# Patient Record
Sex: Male | Born: 1955 | Race: White | Hispanic: No | Marital: Married | Smoking: Never smoker
Health system: Southern US, Community
[De-identification: ages and names within clinical notes are randomized; demographics above are authoritative.]

## PROBLEM LIST (undated history)

## (undated) DIAGNOSIS — K589 Irritable bowel syndrome without diarrhea: Secondary | ICD-10-CM

## (undated) DIAGNOSIS — K4090 Unilateral inguinal hernia, without obstruction or gangrene, not specified as recurrent: Secondary | ICD-10-CM

## (undated) DIAGNOSIS — N400 Enlarged prostate without lower urinary tract symptoms: Secondary | ICD-10-CM

## (undated) DIAGNOSIS — R109 Unspecified abdominal pain: Secondary | ICD-10-CM

## (undated) DIAGNOSIS — Z9889 Other specified postprocedural states: Secondary | ICD-10-CM

## (undated) DIAGNOSIS — F1011 Alcohol abuse, in remission: Secondary | ICD-10-CM

## (undated) DIAGNOSIS — K219 Gastro-esophageal reflux disease without esophagitis: Secondary | ICD-10-CM

## (undated) DIAGNOSIS — I10 Essential (primary) hypertension: Secondary | ICD-10-CM

## (undated) DIAGNOSIS — F5104 Psychophysiologic insomnia: Secondary | ICD-10-CM

## (undated) DIAGNOSIS — N419 Inflammatory disease of prostate, unspecified: Secondary | ICD-10-CM

## (undated) DIAGNOSIS — G8929 Other chronic pain: Secondary | ICD-10-CM

## (undated) HISTORY — DX: Other chronic pain: G89.29

## (undated) HISTORY — PX: OTHER SURGICAL HISTORY: SHX169

## (undated) HISTORY — DX: Unilateral inguinal hernia, without obstruction or gangrene, not specified as recurrent: K40.90

## (undated) HISTORY — DX: Gastro-esophageal reflux disease without esophagitis: K21.9

## (undated) HISTORY — DX: Unspecified abdominal pain: R10.9

## (undated) HISTORY — DX: Other specified postprocedural states: Z98.890

## (undated) HISTORY — DX: Irritable bowel syndrome, unspecified: K58.9

## (undated) HISTORY — PX: BUNIONECTOMY: SHX129

## (undated) HISTORY — PX: REPLACEMENT TOTAL KNEE: SUR1224

## (undated) HISTORY — PX: UPPER GASTROINTESTINAL ENDOSCOPY: SHX188

## (undated) HISTORY — DX: Psychophysiologic insomnia: F51.04

## (undated) HISTORY — PX: WISDOM TOOTH EXTRACTION: SHX21

## (undated) HISTORY — DX: Alcohol abuse, in remission: F10.11

## (undated) HISTORY — DX: Benign prostatic hyperplasia without lower urinary tract symptoms: N40.0

## (undated) HISTORY — PX: COLONOSCOPY: SHX174

## (undated) HISTORY — DX: Inflammatory disease of prostate, unspecified: N41.9

---

## 1976-09-02 HISTORY — PX: ANTERIOR CRUCIATE LIGAMENT REPAIR: SHX115

## 1998-05-24 ENCOUNTER — Encounter: Admission: RE | Admit: 1998-05-24 | Discharge: 1998-08-22 | Payer: Self-pay | Admitting: Family Medicine

## 1999-05-29 ENCOUNTER — Emergency Department (HOSPITAL_COMMUNITY): Admission: EM | Admit: 1999-05-29 | Discharge: 1999-05-29 | Payer: Self-pay | Admitting: Emergency Medicine

## 1999-05-29 ENCOUNTER — Encounter: Payer: Self-pay | Admitting: Emergency Medicine

## 1999-07-13 ENCOUNTER — Ambulatory Visit (HOSPITAL_COMMUNITY): Admission: RE | Admit: 1999-07-13 | Discharge: 1999-07-13 | Payer: Self-pay | Admitting: Gastroenterology

## 2001-02-24 ENCOUNTER — Ambulatory Visit (HOSPITAL_COMMUNITY): Admission: RE | Admit: 2001-02-24 | Discharge: 2001-02-24 | Payer: Self-pay | Admitting: Gastroenterology

## 2002-09-02 DIAGNOSIS — Z9889 Other specified postprocedural states: Secondary | ICD-10-CM

## 2002-09-02 HISTORY — DX: Other specified postprocedural states: Z98.890

## 2004-01-10 ENCOUNTER — Emergency Department (HOSPITAL_COMMUNITY): Admission: EM | Admit: 2004-01-10 | Discharge: 2004-01-10 | Payer: Self-pay | Admitting: Emergency Medicine
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
## 2005-04-06 ENCOUNTER — Emergency Department (HOSPITAL_COMMUNITY): Admission: EM | Admit: 2005-04-06 | Discharge: 2005-04-06 | Payer: Self-pay | Admitting: Emergency Medicine

## 2005-04-09 ENCOUNTER — Emergency Department (HOSPITAL_COMMUNITY): Admission: EM | Admit: 2005-04-09 | Discharge: 2005-04-09 | Payer: Self-pay | Admitting: Emergency Medicine

## 2007-02-21 ENCOUNTER — Emergency Department (HOSPITAL_COMMUNITY): Admission: EM | Admit: 2007-02-21 | Discharge: 2007-02-22 | Payer: Self-pay | Admitting: Emergency Medicine
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
## 2007-07-27 ENCOUNTER — Ambulatory Visit (HOSPITAL_COMMUNITY): Admission: RE | Admit: 2007-07-27 | Discharge: 2007-07-27 | Payer: Self-pay | Admitting: Gastroenterology

## 2007-07-27 IMAGING — NM NM HEPATO W/GB/PHARM/[PERSON_NAME]
2 series · 12 of 12 positions shown · non-contrast
Comparison: None.

CLINICAL DATA: Epigastric pain with nausea and vomiting. 
 NUCLEAR MEDICINE HEPATOBILIARY SCAN WITH EJECTION FRACTION:
TECHNIQUE: Sequential abdominal images were obtained following intravenous injection of radiopharmaceutical.  Sequential images were continued during slow intravenous infusion of sincalide, and the gallbladder ejection fraction was calculated.
 Radiopharmaceutical:  5.2 mCi [GJ] Choletec and 1.5 mcg CCK.

[Series 1: he hepatobiliary · 3.22mm/px · 6 of 56 frames shown (1 of 2)]
[frame 5/56]
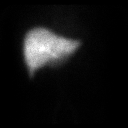
[frame 14/56]
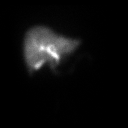
[frame 24/56]
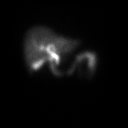
[frame 33/56]
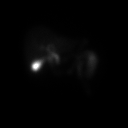
[frame 42/56]
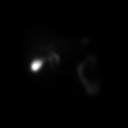
[frame 52/56]
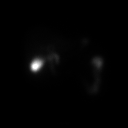

[Series 1: he hepatobiliary · 3.22mm/px · 6 of 60 frames shown (2 of 2)]
[frame 6/60]
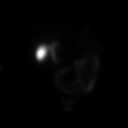
[frame 16/60]
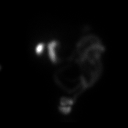
[frame 26/60]
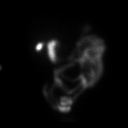
[frame 36/60]
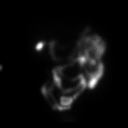
[frame 46/60]
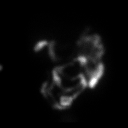
[frame 56/60]
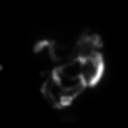

[12 of 12 positions shown; findings below may reference images not displayed]

FINDINGS: There is prompt visualization of the gallbladder, bile ducts, and small bowel.  The gallbladder ejection fraction is calculated at 89.4%, which is normal.
IMPRESSION: 1.  Patent cystic and common bile ducts. 
 2.  Gallbladder contracts physiologically.

## 2007-10-22 ENCOUNTER — Emergency Department (HOSPITAL_COMMUNITY): Admission: EM | Admit: 2007-10-22 | Discharge: 2007-10-22 | Payer: Self-pay | Admitting: Emergency Medicine

## 2007-10-22 IMAGING — CR DG ABDOMEN ACUTE W/ 1V CHEST
3 series · 3 of 3 positions shown · non-contrast
Comparison: none

CLINICAL DATA: Chest and abdominal pain

Acute abdomen with chest:
Frontal chest film is clear. Supine and erect abdomen films show no free air.
Normal bowel gas pattern. Bilateral pelvic phleboliths. Visualized bones
unremarkable.

[w chest pa]
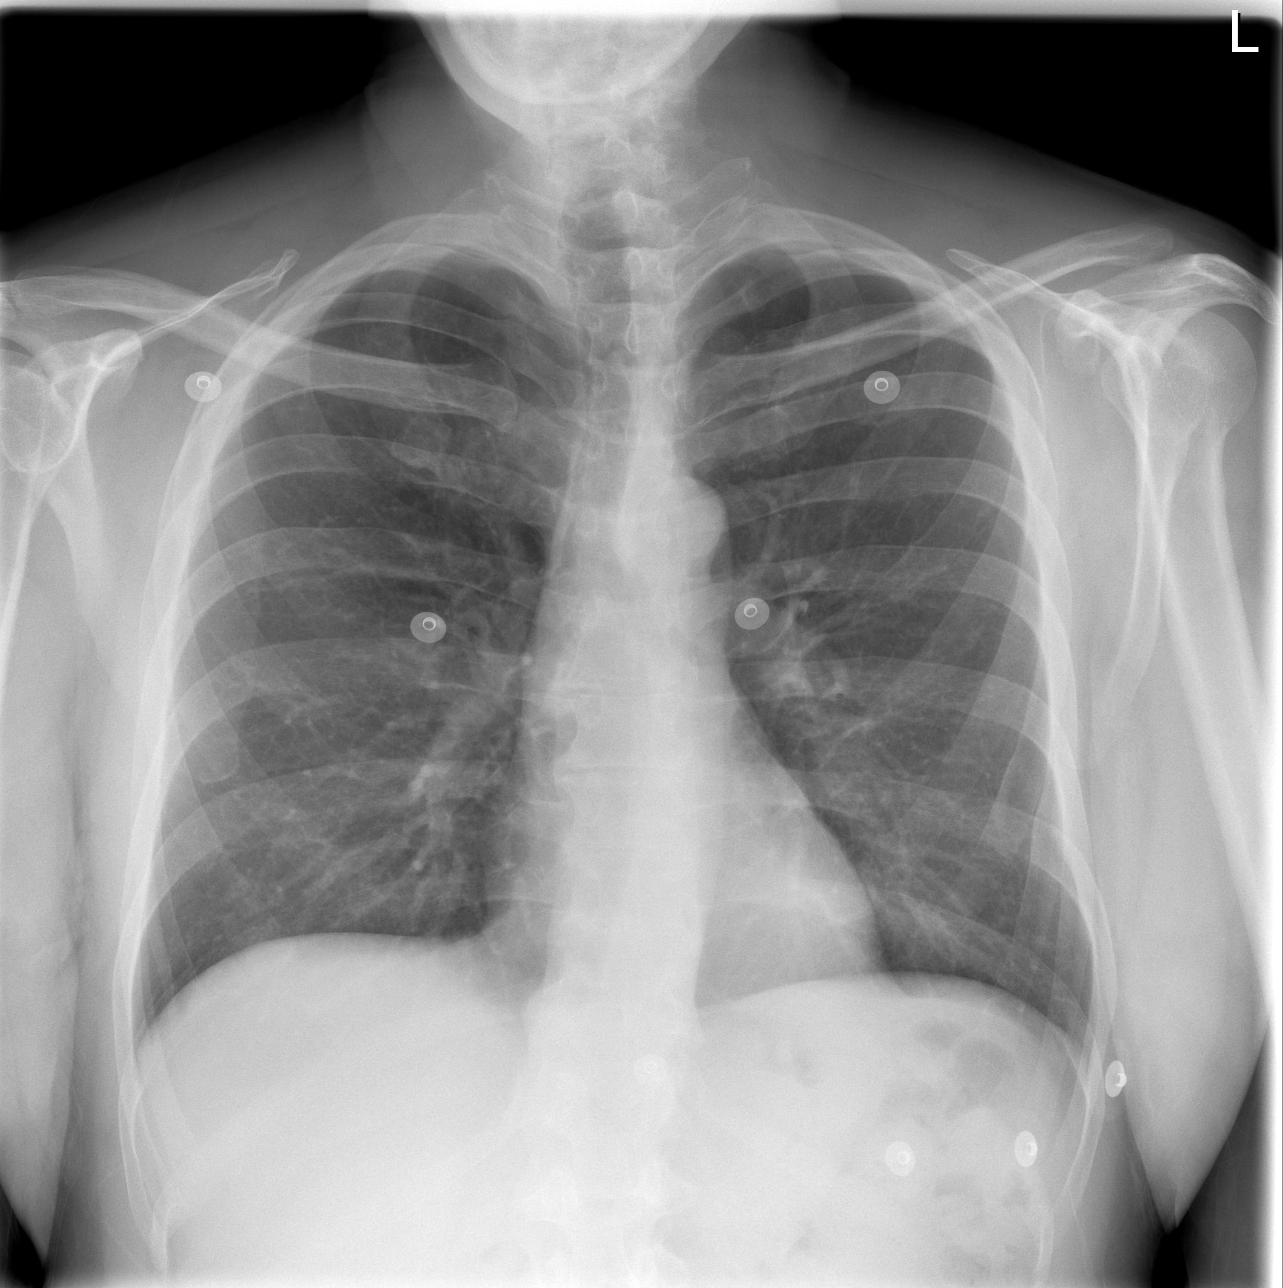

[w abdomen upright]
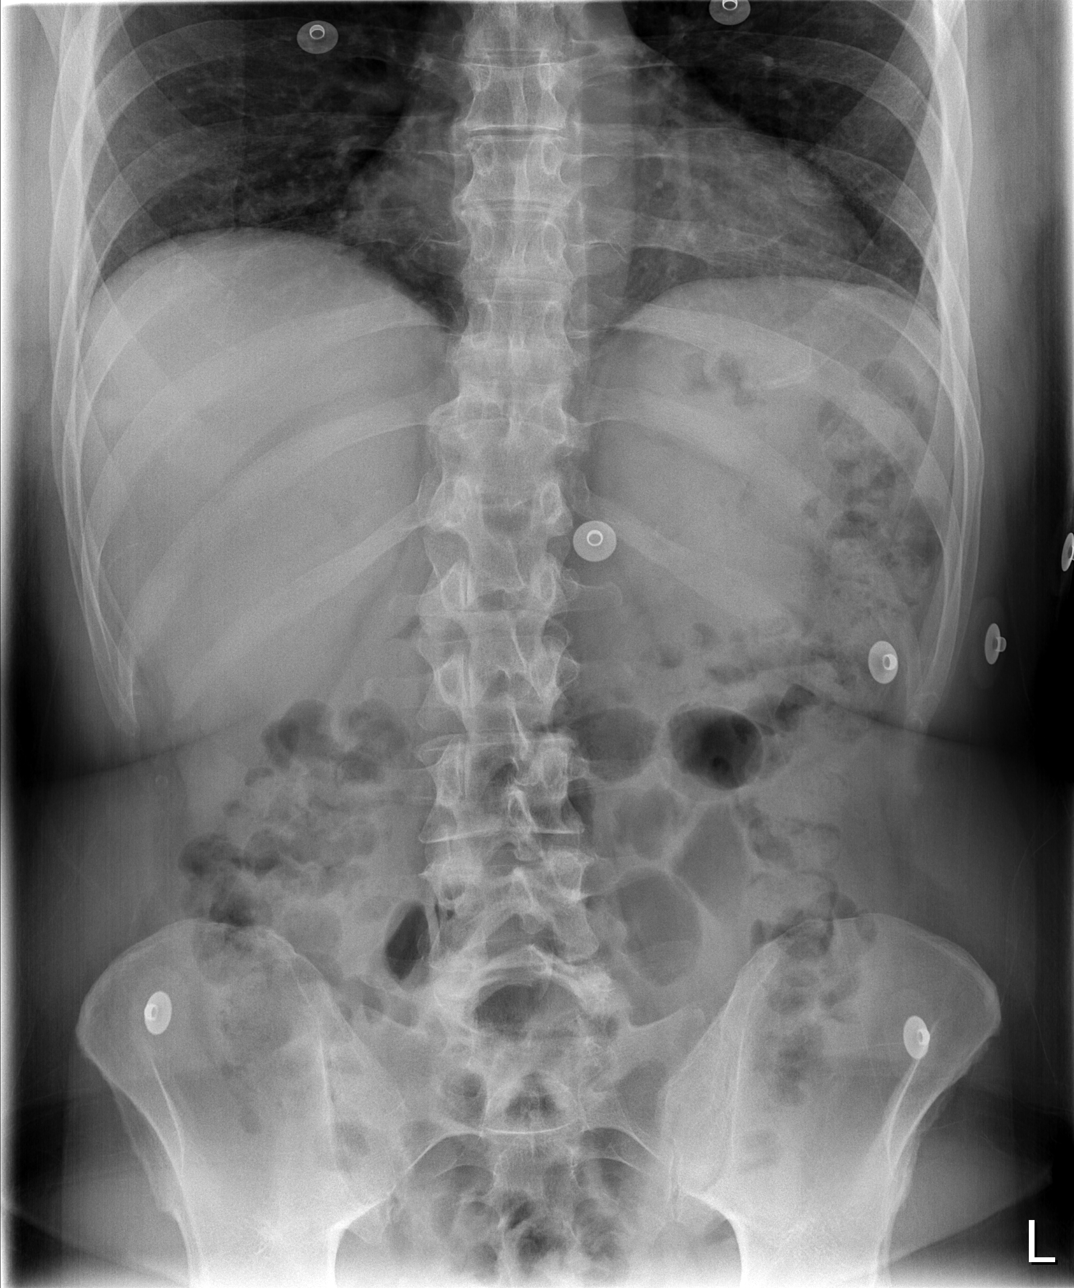

[t abdomen supine]
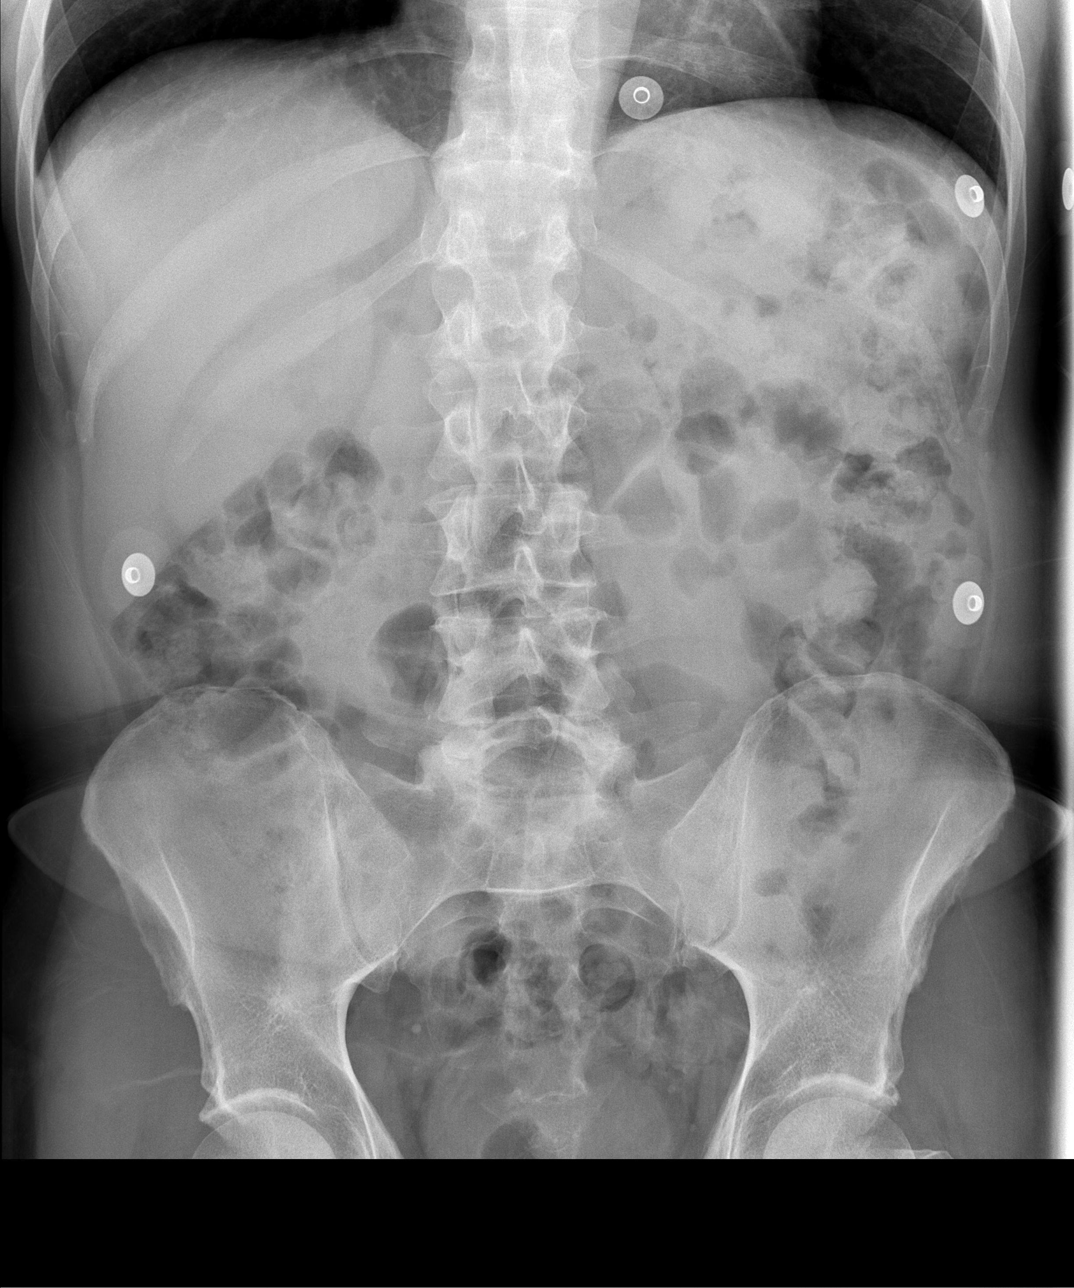

[3 of 3 positions shown; findings below may reference images not displayed]

IMPRESSION: 1. Normal bowel gas pattern. No free air.
2. No acute cardiopulmonary disease

## 2007-10-29 ENCOUNTER — Ambulatory Visit (HOSPITAL_COMMUNITY): Admission: RE | Admit: 2007-10-29 | Discharge: 2007-10-29 | Payer: Self-pay | Admitting: Gastroenterology

## 2007-11-11 ENCOUNTER — Ambulatory Visit (HOSPITAL_COMMUNITY): Admission: RE | Admit: 2007-11-11 | Discharge: 2007-11-11 | Payer: Self-pay | Admitting: Gastroenterology

## 2007-11-11 IMAGING — CR DG ABDOMEN 1V
1 series · 1 of 1 positions shown · non-contrast
Comparison: [DATE].

CLINICAL DATA: 2 weeks after endoscopy capsule.  Abdominal pain.
 ABDOMEN ? 1 VIEW:

[t abdomen supine]
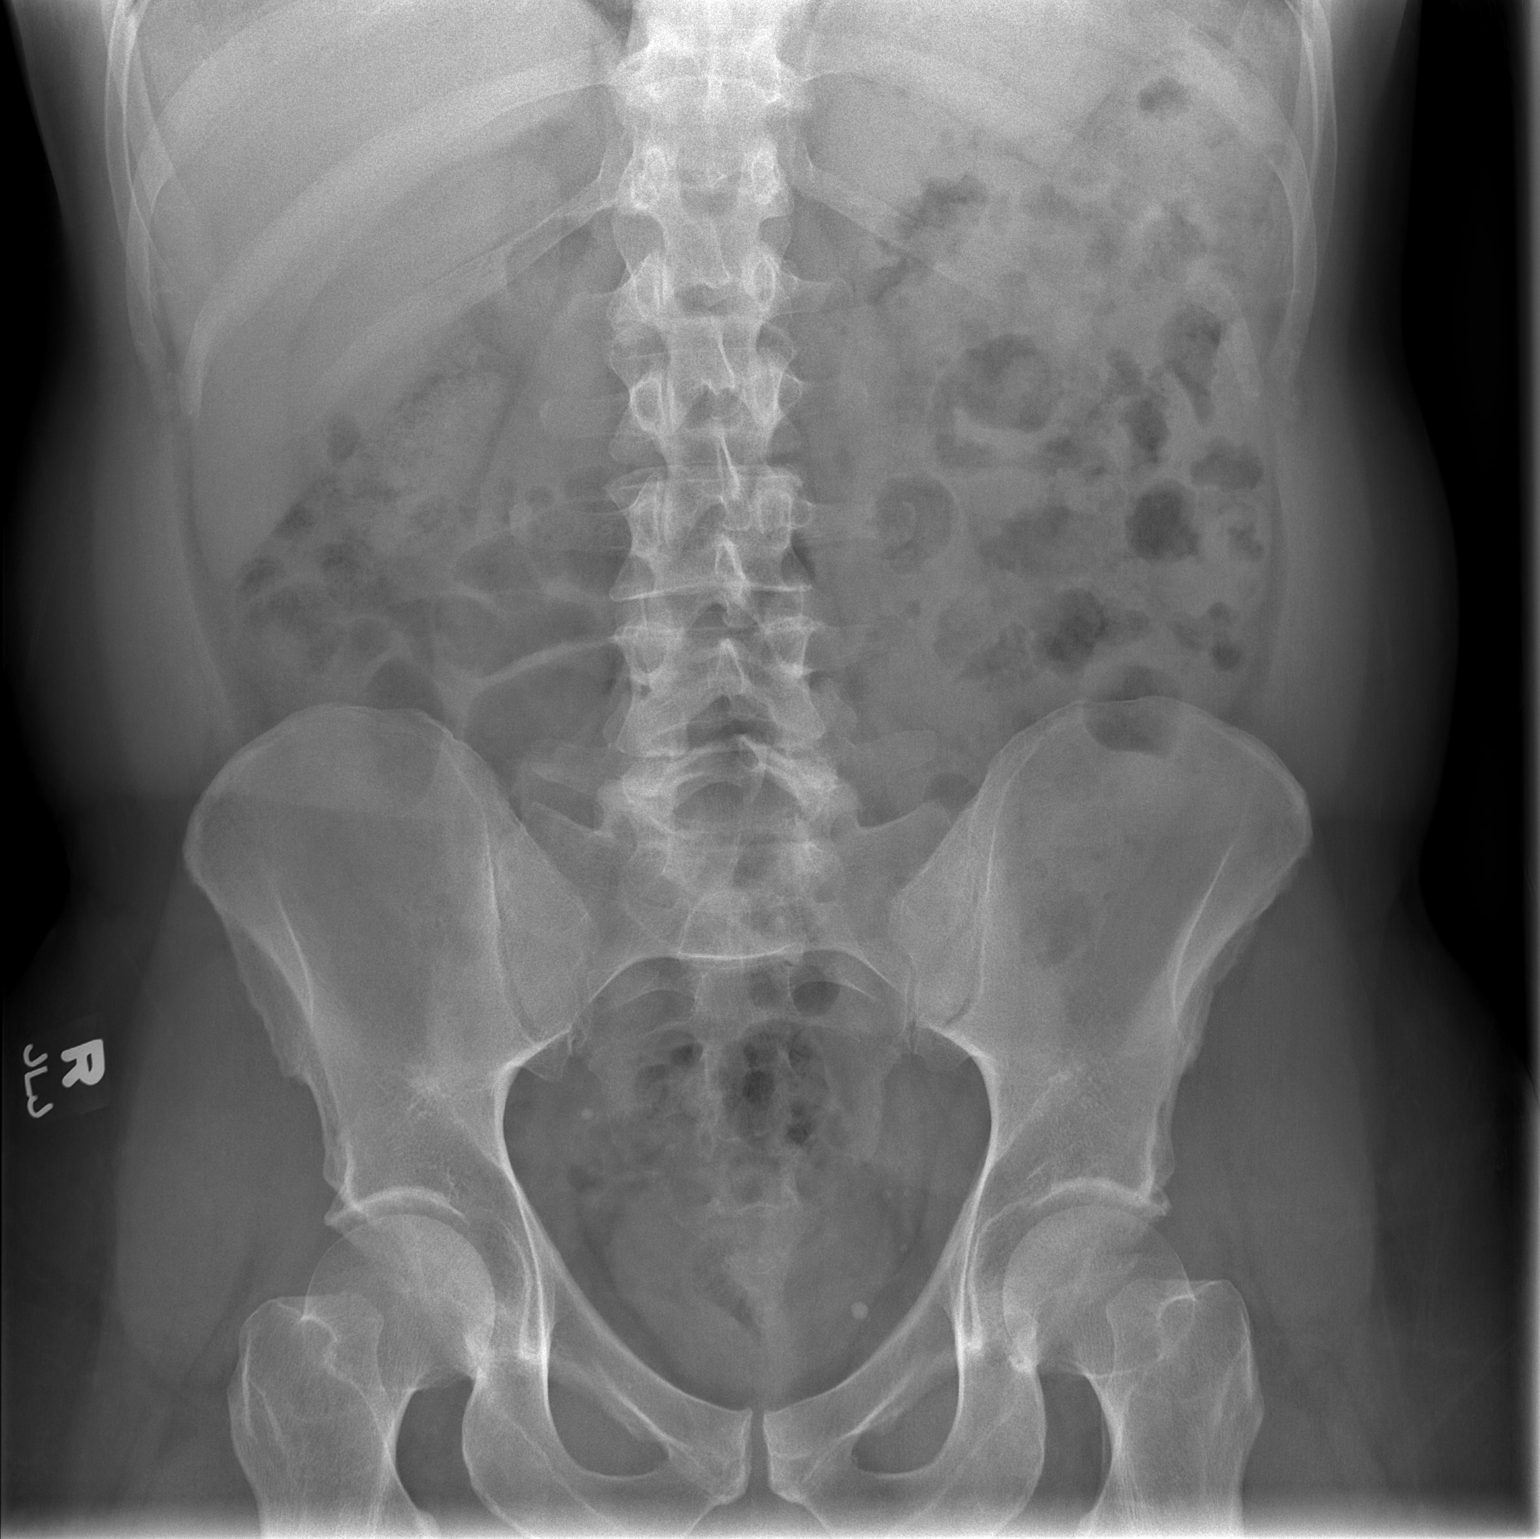

[1 of 1 positions shown; findings below may reference images not displayed]

FINDINGS: Bowel gas pattern unremarkable.  No radiopaque foreign body.  Phleboliths in the anatomic pelvis.
IMPRESSION: Endoscopy capsule is not definitely visualized.

## 2007-11-13 ENCOUNTER — Encounter: Admission: RE | Admit: 2007-11-13 | Discharge: 2007-11-13 | Payer: Self-pay | Admitting: Gastroenterology

## 2007-11-13 IMAGING — RF DG SMALL BOWEL
13 series · 13 of 13 positions shown · non-contrast
Comparison: none

CLINICAL DATA: Diarrhea

SMALL BOWEL SERIES:
TECHNIQUE: Following ingestion of barium, serial small bowel radiographs were
obtained, including spot views of the terminal ileum.

[Series 1: run · 1 of 1 slices shown (1 of 13)]
[im 1/1]
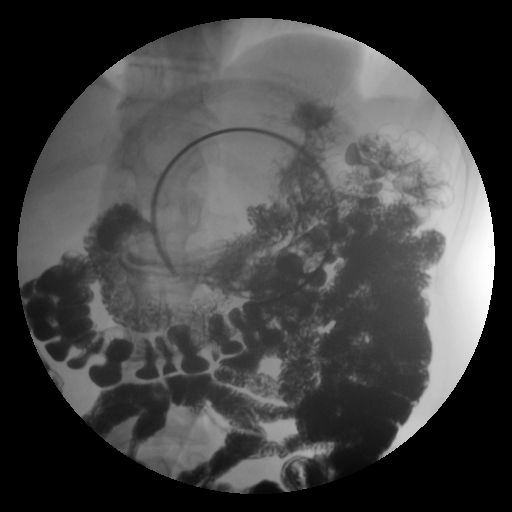

[Series 2: run · 1 of 1 slices shown (2 of 13)]
[im 1/1]
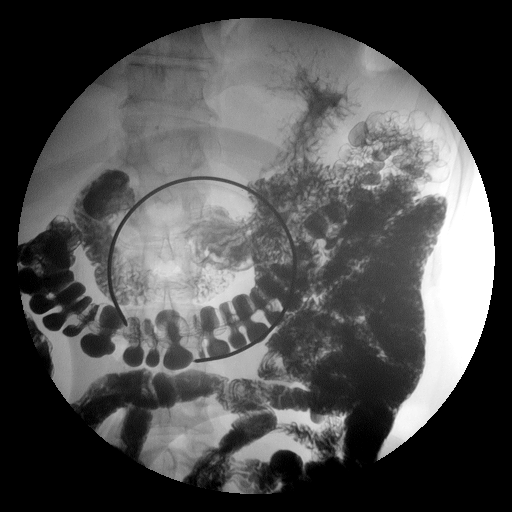

[Series 3: run · 1 of 1 slices shown (3 of 13)]
[im 1/1]
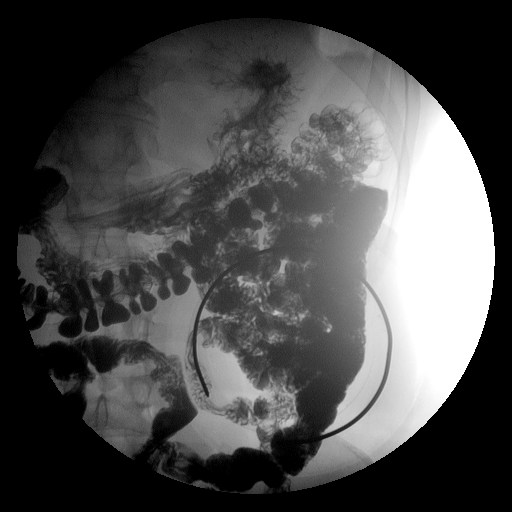

[Series 4: run · 1 of 1 slices shown (4 of 13)]
[im 1/1]
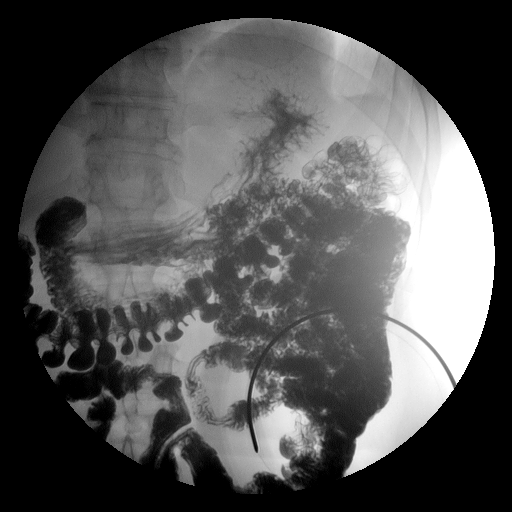

[Series 5: run · 1 of 1 slices shown (5 of 13)]
[im 1/1]
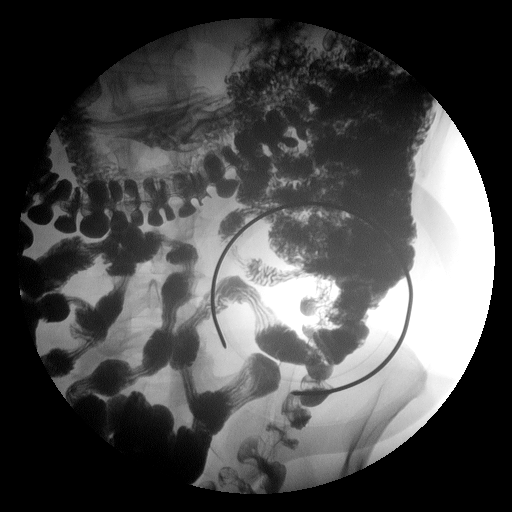

[Series 6: run · 1 of 1 slices shown (6 of 13)]
[im 1/1]
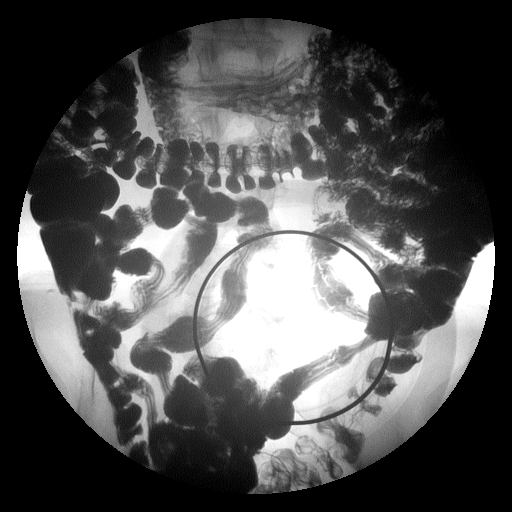

[Series 7: run · 1 of 1 slices shown (7 of 13)]
[im 1/1]
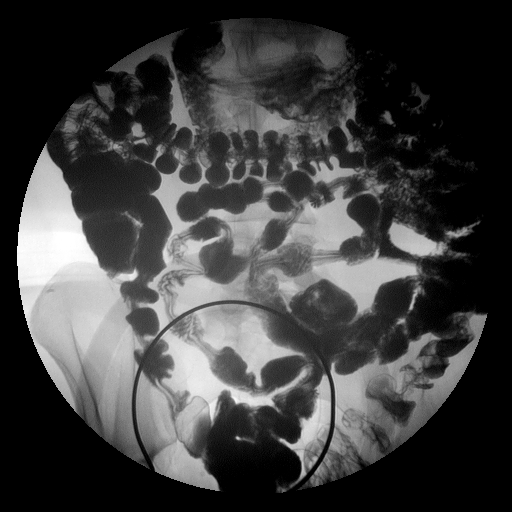

[Series 8: run · 1 of 1 slices shown (8 of 13)]
[im 1/1]
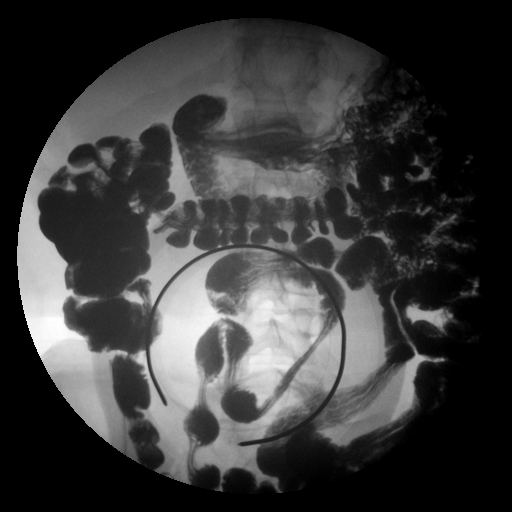

[Series 9: run · 1 of 1 slices shown (9 of 13)]
[im 1/1]
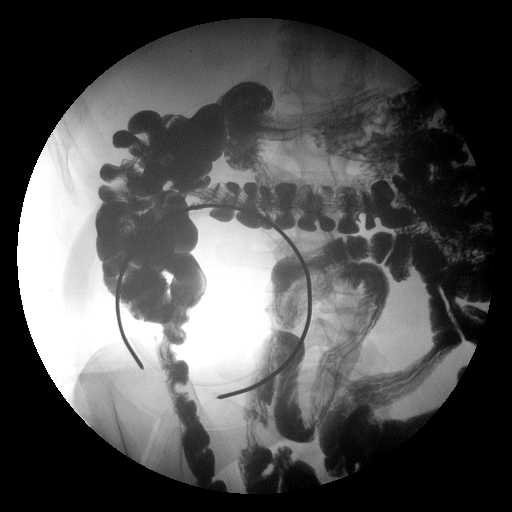

[Series 10: run · 1 of 1 slices shown (10 of 13)]
[im 1/1]
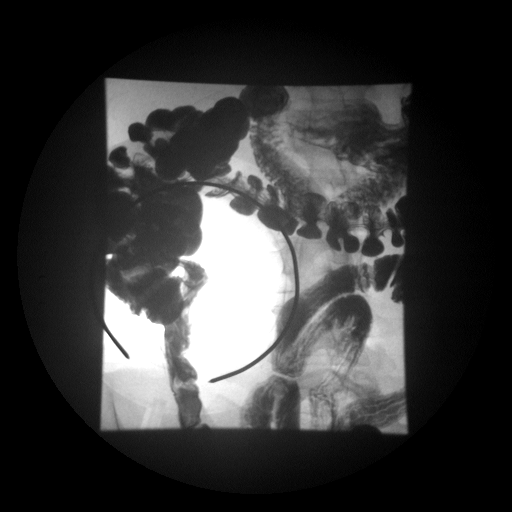

[Series 11: run · 1 of 1 slices shown (11 of 13)]
[im 1/1]
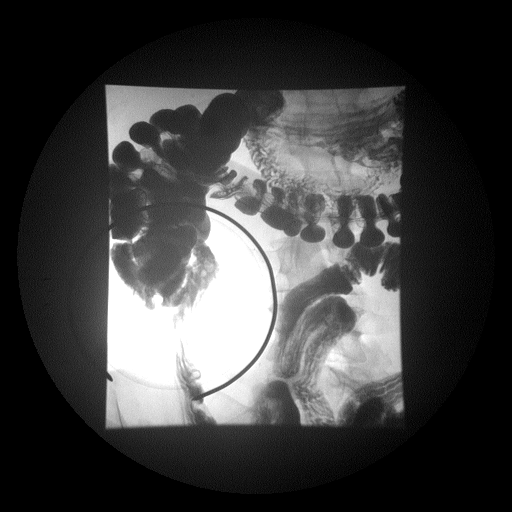

[Series 12: run · 1 of 1 slices shown (12 of 13)]
[im 1/1]
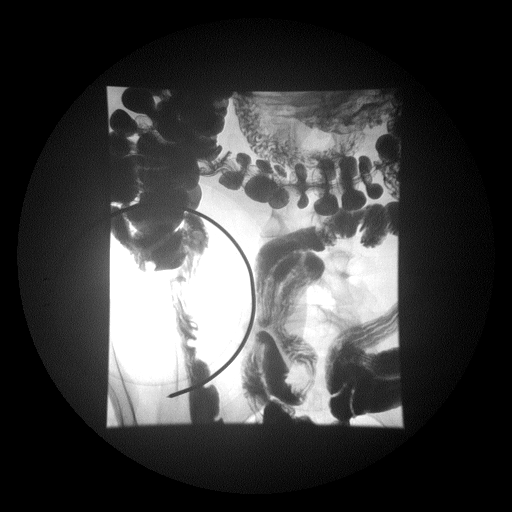

[Series 13: run · 1 of 1 slices shown (13 of 13)]
[im 1/1]
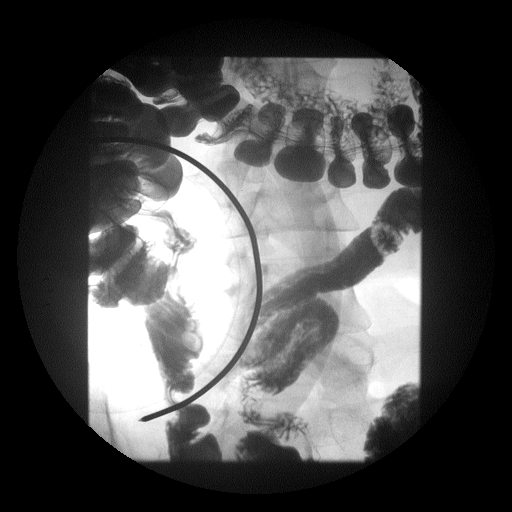

[13 of 13 positions shown; findings below may reference images not displayed]

FINDINGS: Preprocedure KUB shows a normal bowel gas pattern. The visualized
bony structures are unremarkable.

The patient was given 2 cups of oral barium and a film obtained 30 minutes later
shows contrast passage through to the level of the rectal vault. There is no
small bowel dilatation. Proximal jejunal loops have normal caliber and mucosal
fold pattern. The duodenal c-loop is normally positioned. Distal small bowel is
also normal in caliber and fold configuration.

Spot compression imaging was performed throughout the small bowel and
demonstrates no evidence for stricture, mass/mass-effect, or diverticulum. The
terminal ileum is normal.
IMPRESSION: Contrast transit through small bowel loops in 30 minutes. Small bowel loops are
structurally normal.

## 2007-11-13 IMAGING — CR DG SMALL BOWEL
2 series · 2 of 2 positions shown · non-contrast
Comparison: none

CLINICAL DATA: Diarrhea

SMALL BOWEL SERIES:
TECHNIQUE: Following ingestion of barium, serial small bowel radiographs were
obtained, including spot views of the terminal ileum.

[view not recorded (1 of 2)]
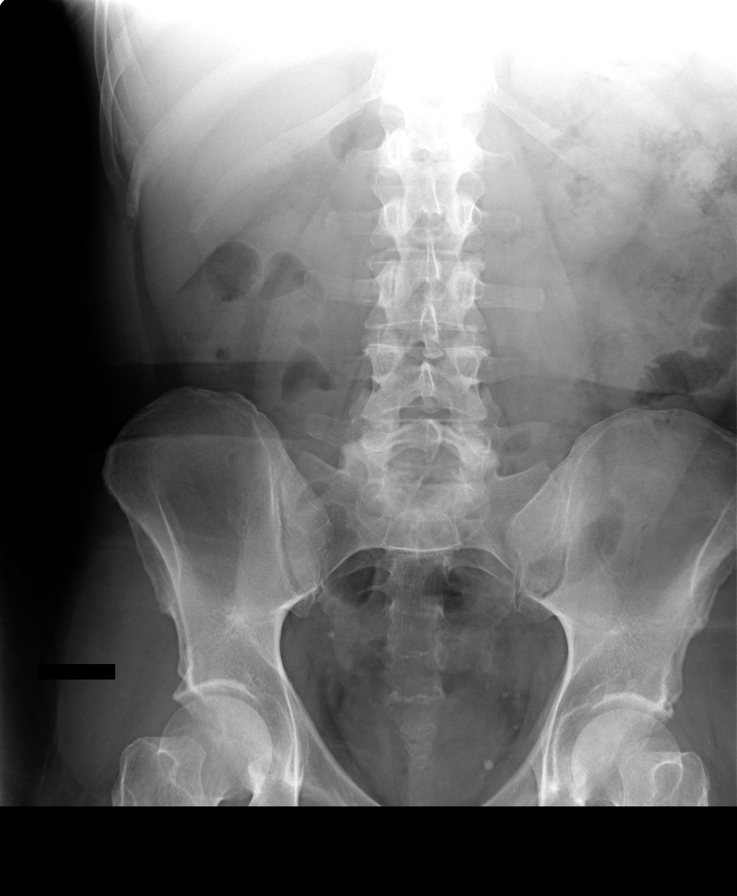

[view not recorded (2 of 2)]
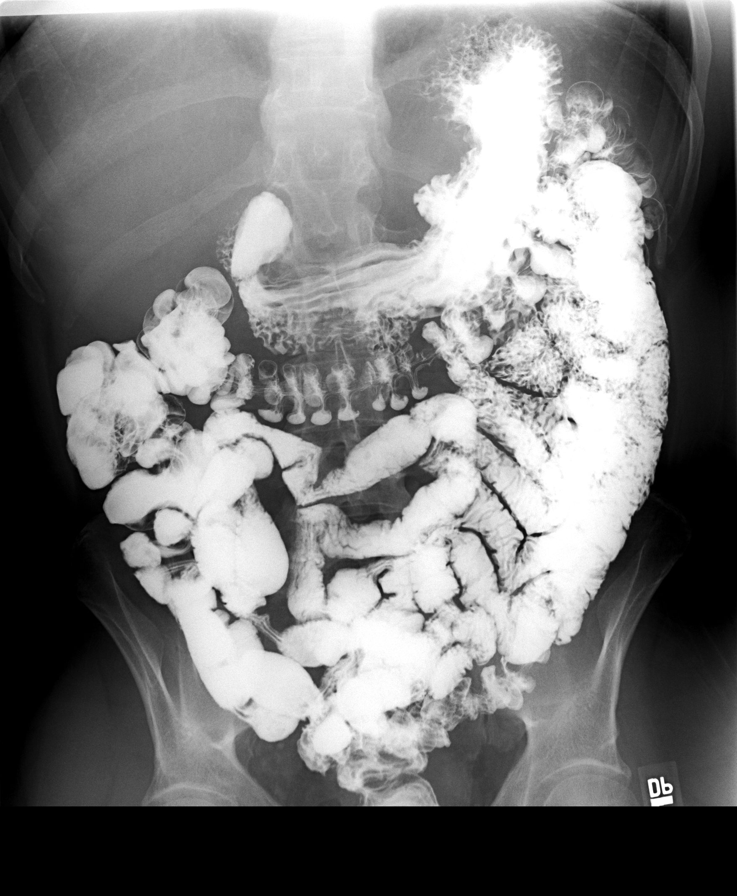

[2 of 2 positions shown; findings below may reference images not displayed]

FINDINGS: Preprocedure KUB shows a normal bowel gas pattern. The visualized
bony structures are unremarkable.

The patient was given 2 cups of oral barium and a film obtained 30 minutes later
shows contrast passage through to the level of the rectal vault. There is no
small bowel dilatation. Proximal jejunal loops have normal caliber and mucosal
fold pattern. The duodenal c-loop is normally positioned. Distal small bowel is
also normal in caliber and fold configuration.

Spot compression imaging was performed throughout the small bowel and
demonstrates no evidence for stricture, mass/mass-effect, or diverticulum. The
terminal ileum is normal.
IMPRESSION: Contrast transit through small bowel loops in 30 minutes. Small bowel loops are
structurally normal.

## 2011-05-24 LAB — URINALYSIS, ROUTINE W REFLEX MICROSCOPIC
Glucose, UA: NEGATIVE
Nitrite: NEGATIVE
Specific Gravity, Urine: 1.005
pH: 6

## 2011-05-24 LAB — CBC
Hemoglobin: 15.4
RDW: 12.5

## 2011-05-24 LAB — COMPREHENSIVE METABOLIC PANEL
ALT: 24
AST: 19
Albumin: 4.4
Alkaline Phosphatase: 46
GFR calc Af Amer: >60
Glucose, Bld: 108 — ABNORMAL HIGH
Potassium: 4
Sodium: 139
Total Protein: 7.3

## 2011-05-24 LAB — DIFFERENTIAL
Basophils Relative: 1
Eosinophils Absolute: 0.1
Eosinophils Relative: 1
Lymphs Abs: 1.1
Monocytes Absolute: 0.4
Monocytes Relative: 4
Neutrophils Relative %: 82 — ABNORMAL HIGH

## 2011-05-24 LAB — POCT CARDIAC MARKERS: Troponin i, poc: <0.05

## 2011-06-19 LAB — BASIC METABOLIC PANEL
Chloride: 98
Creatinine, Ser: 1.11
GFR calc Af Amer: >60
GFR calc non Af Amer: >60
Potassium: 3.8

## 2011-06-19 LAB — POCT CARDIAC MARKERS
Myoglobin, poc: 55.1
Operator id: 1192
Troponin i, poc: <0.05

## 2011-06-19 LAB — LIPASE, BLOOD: Lipase: 27

## 2011-09-25 ENCOUNTER — Telehealth (INDEPENDENT_AMBULATORY_CARE_PROVIDER_SITE_OTHER): Payer: Self-pay | Admitting: Surgery

## 2011-09-25 NOTE — Telephone Encounter (Signed)
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  Dr. Kem Parkinson comes highly recommended by Dr. Ritta Slot.

## 2011-09-26 ENCOUNTER — Encounter (INDEPENDENT_AMBULATORY_CARE_PROVIDER_SITE_OTHER): Payer: Self-pay | Admitting: Surgery

## 2011-09-26 NOTE — Telephone Encounter (Signed)
I gave the patient an appointment for tomorrow am. He is very appreciative

## 2011-09-27 ENCOUNTER — Ambulatory Visit (INDEPENDENT_AMBULATORY_CARE_PROVIDER_SITE_OTHER): Payer: Commercial Managed Care - PPO | Admitting: Surgery

## 2011-09-27 ENCOUNTER — Encounter (INDEPENDENT_AMBULATORY_CARE_PROVIDER_SITE_OTHER): Payer: Self-pay | Admitting: Surgery

## 2011-09-27 DIAGNOSIS — K4090 Unilateral inguinal hernia, without obstruction or gangrene, not specified as recurrent: Secondary | ICD-10-CM | POA: Insufficient documentation

## 2011-09-27 NOTE — Progress Notes (Signed)
Re:   Eric Rhodes DOB:   01-Sep-1956 MRN:   [id number]  ASSESSMENT AND PLAN: 1.  Left inguinal hernia, medium sized  I discussed the indications and complications of hernia surgery with the patient.  I discussed both the laparoscopic and open approach to hernia repair..  The potential risks of hernia surgery include, but are not limited to, bleeding, infection, open surgery, nerve injury, and recurrence of the hernia.  I provided the patient literature about hernia surgery.  He asked a lot of appropriate questions about hernia repairs.  He decided to go with an open repair.  He wants to do this in the first week of March. 2.  GERD. 3.  Irritable bowel disease.  Chief Complaint  Patient presents with  . Inguinal Hernia   REFERRING PHYSICIAN:   Ritta Slot, MD.  HISTORY OF PRESENT ILLNESS: Eric Rhodes is a 56 y.o. (DOB: 03-25-1956)  white male whose primary care physician is Eric Labella, MD, MD and comes to me today for left inguinal hernia.  The patient has been doing a power program with his son which involved lifting heavier weights.  He noticed after one work out, a bulge in his left groin.  He is having no pain other than the bulge.  He has no prior history of hernia repair.  Patient with no history of stomach disease.  No history of liver disease.  No history of gall bladder disease.  No history of pancreas disease.  No history of colon disease.  He had a negative colonoscopy by Dr. Kinnie Rhodes in 2004.    Past Medical History  Diagnosis Date  . GERD (gastroesophageal reflux disease)     diet controlled  . History of colonoscopy 2004  . History of endoscopy 2004  . Irritable bowel disease       Past Surgical History  Procedure Date  . Anterior cruciate ligament repair 1978      Current Outpatient Prescriptions  Medication Sig Dispense Refill  . aspirin 81 MG EC tablet Take 81 mg by mouth daily. Swallow whole.          Allergies  Allergen Reactions    . Betadine (Povidone Iodine)     Skin sensitive    REVIEW OF SYSTEMS: Skin:  No history of rash.  No history of abnormal moles. Infection:  No history of hepatitis or HIV.  No history of MRSA. Neurologic:  No history of stroke.  No history of seizure.  No history of headaches. Cardiac:  No history of hypertension. No history of heart disease.  No history of prior cardiac catheterization.  No history of seeing a cardiologist. Pulmonary:  Does not smoke cigarettes.  No asthma or bronchitis.  No OSA/CPAP.  Endocrine:  No diabetes. No thyroid disease. Gastrointestinal: See HPI. Urologic:  No history of kidney stones.  No history of bladder infections. Musculoskeletal:  Left ACL repair, 1978.  He does a lot of biking.  We talked about the [employer]. Hematologic:  No bleeding disorder.  No history of anemia.  Not anticoagulated. Psycho-social:  The patient is oriented.   The patient has no obvious psychologic or social impairment to understanding our conversation and plan.  SOCIAL and FAMILY HISTORY: Works as a Acupuncturist. Married.  PHYSICAL EXAM: BP 142/78  Pulse 60  Temp(Src) 97.7 F (36.5 C) (Temporal)  Resp 24  Ht 5' 3.5" (1.613 m)  Wt 173 lb 6 oz (78.642 kg)  BMI 30.23 kg/m2  General: Stocky, short, WN  WM who is alert and generally healthy appearing.  HEENT: Normal. Pupils equal. Good dentition. Neck: Supple. No mass.  No thyroid mass.  Carotid pulse okay with no bruit. Lymph Nodes:  No supraclavicular or cervical nodes. Lungs: Clear to auscultation and symmetric breath sounds. Heart:  RRR. No murmur or rub. Abdomen: Soft. No mass. No tenderness.  Normal bowel sounds.  No abdominal scars.  Left inguinal hernia, medium in size.  Reducible.  I do not feel a hernia on the right.  Rectal: Not done. Extremities:  Good strength and ROM  in upper and lower extremities. Neurologic:  Grossly intact to motor and sensory function. Psychiatric: Has normal mood and affect. Behavior is  normal.   DATA REVIEWED: Notes he filled out  Ovidio Kin, MD,  [facility], [place] [address] [place]  [address]   [place], [place]    [address] Phone:  [phone] FAX:  [phone]

## 2011-10-31 ENCOUNTER — Other Ambulatory Visit (INDEPENDENT_AMBULATORY_CARE_PROVIDER_SITE_OTHER): Payer: Self-pay | Admitting: Surgery

## 2011-10-31 ENCOUNTER — Encounter (HOSPITAL_BASED_OUTPATIENT_CLINIC_OR_DEPARTMENT_OTHER): Payer: Self-pay | Admitting: *Deleted

## 2011-10-31 NOTE — Progress Notes (Signed)
No labs needed per anesthesia-waiting on ccs orders-will call pt if needs to come in

## 2011-11-01 ENCOUNTER — Ambulatory Visit (INDEPENDENT_AMBULATORY_CARE_PROVIDER_SITE_OTHER): Payer: Self-pay | Admitting: Surgery

## 2011-11-01 HISTORY — PX: HERNIA REPAIR: SHX51

## 2011-11-07 ENCOUNTER — Encounter (HOSPITAL_BASED_OUTPATIENT_CLINIC_OR_DEPARTMENT_OTHER)
Admission: RE | Disposition: A | Discharge status: Home / Self Care | Payer: Self-pay | Source: Ambulatory Visit | Attending: Surgery

## 2011-11-07 ENCOUNTER — Encounter (HOSPITAL_BASED_OUTPATIENT_CLINIC_OR_DEPARTMENT_OTHER): Payer: Self-pay | Admitting: *Deleted

## 2011-11-07 ENCOUNTER — Ambulatory Visit (HOSPITAL_BASED_OUTPATIENT_CLINIC_OR_DEPARTMENT_OTHER): Payer: 59 | Admitting: Anesthesiology

## 2011-11-07 ENCOUNTER — Ambulatory Visit (HOSPITAL_BASED_OUTPATIENT_CLINIC_OR_DEPARTMENT_OTHER)
Admission: RE | Admit: 2011-11-07 | Discharge: 2011-11-07 | Disposition: A | Discharge status: Home / Self Care | Payer: 59 | Source: Ambulatory Visit | Attending: Surgery | Admitting: Surgery

## 2011-11-07 ENCOUNTER — Encounter (HOSPITAL_BASED_OUTPATIENT_CLINIC_OR_DEPARTMENT_OTHER): Payer: Self-pay | Admitting: Anesthesiology

## 2011-11-07 DIAGNOSIS — K219 Gastro-esophageal reflux disease without esophagitis: Secondary | ICD-10-CM | POA: Insufficient documentation

## 2011-11-07 DIAGNOSIS — K4090 Unilateral inguinal hernia, without obstruction or gangrene, not specified as recurrent: Secondary | ICD-10-CM

## 2011-11-07 DIAGNOSIS — Z01812 Encounter for preprocedural laboratory examination: Secondary | ICD-10-CM | POA: Insufficient documentation

## 2011-11-07 DIAGNOSIS — K589 Irritable bowel syndrome without diarrhea: Secondary | ICD-10-CM | POA: Insufficient documentation

## 2011-11-07 HISTORY — PX: INGUINAL HERNIA REPAIR: SHX194

## 2011-11-07 LAB — POCT HEMOGLOBIN-HEMACUE: Hemoglobin: 14.7 g/dL (ref 13.0–17.0)

## 2011-11-07 SURGERY — REPAIR, HERNIA, INGUINAL, ADULT
Anesthesia: General | Site: Groin | Laterality: Left | Wound class: Clean

## 2011-11-07 MED ORDER — HYDROMORPHONE HCL PF 1 MG/ML IJ SOLN
0.2500 mg | INTRAMUSCULAR | Status: DC | PRN
  Administered 2011-11-07: 0.5 mg via INTRAVENOUS

## 2011-11-07 MED ORDER — MORPHINE SULFATE 2 MG/ML IJ SOLN: 0.0500 mg/kg | INTRAMUSCULAR | Status: DC | PRN

## 2011-11-07 MED ORDER — CHLORHEXIDINE GLUCONATE 4 % EX LIQD: 1.0000 "application " | Freq: Once | CUTANEOUS | Status: DC

## 2011-11-07 MED ORDER — FENTANYL CITRATE 0.05 MG/ML IJ SOLN
INTRAMUSCULAR | Status: DC | PRN
  Administered 2011-11-07: 100 ug via INTRAVENOUS
  Administered 2011-11-07 (×2): 25 ug via INTRAVENOUS

## 2011-11-07 MED ORDER — ONDANSETRON HCL 4 MG/2ML IJ SOLN
INTRAMUSCULAR | Status: DC | PRN
  Administered 2011-11-07: 4 mg via INTRAVENOUS

## 2011-11-07 MED ORDER — LACTATED RINGERS IV SOLN
INTRAVENOUS | Status: DC
  Administered 2011-11-07 (×2): via INTRAVENOUS
  Administered 2011-11-07: 20 mL/h via INTRAVENOUS

## 2011-11-07 MED ORDER — KETOROLAC TROMETHAMINE 30 MG/ML IJ SOLN
INTRAMUSCULAR | Status: DC | PRN
  Administered 2011-11-07: 30 mg via INTRAVENOUS

## 2011-11-07 MED ORDER — BUPIVACAINE HCL (PF) 0.25 % IJ SOLN
INTRAMUSCULAR | Status: DC | PRN
  Administered 2011-11-07: 30 mL

## 2011-11-07 MED ORDER — CEFAZOLIN SODIUM 1-5 GM-% IV SOLN: 1.0000 g | INTRAVENOUS | Status: DC

## 2011-11-07 MED ORDER — ACETAMINOPHEN 10 MG/ML IV SOLN
INTRAVENOUS | Status: DC | PRN
  Administered 2011-11-07: 1000 mg via INTRAVENOUS

## 2011-11-07 MED ORDER — 0.9 % SODIUM CHLORIDE (POUR BTL) OPTIME
TOPICAL | Status: DC | PRN
  Administered 2011-11-07: 1000 mL

## 2011-11-07 MED ORDER — HYDROCODONE-ACETAMINOPHEN 5-325 MG PO TABS
1.0000 | ORAL_TABLET | Freq: Four times a day (QID) | ORAL | Status: DC | PRN
  Administered 2011-11-07: 1 via ORAL

## 2011-11-07 MED ORDER — DEXAMETHASONE SODIUM PHOSPHATE 4 MG/ML IJ SOLN
INTRAMUSCULAR | Status: DC | PRN
  Administered 2011-11-07: 10 mg via INTRAVENOUS

## 2011-11-07 MED ORDER — LIDOCAINE HCL (CARDIAC) 20 MG/ML IV SOLN
INTRAVENOUS | Status: DC | PRN
  Administered 2011-11-07: 100 mg via INTRAVENOUS

## 2011-11-07 MED ORDER — HYDROCODONE-ACETAMINOPHEN 5-325 MG PO TABS: 1.0000 | ORAL_TABLET | Freq: Four times a day (QID) | ORAL | Status: AC | PRN

## 2011-11-07 MED ORDER — CEFAZOLIN SODIUM 1-5 GM-% IV SOLN
1.0000 g | INTRAVENOUS | Status: AC
  Administered 2011-11-07: 1 g via INTRAVENOUS

## 2011-11-07 MED ORDER — ONDANSETRON HCL 4 MG/2ML IJ SOLN: 4.0000 mg | Freq: Once | INTRAMUSCULAR | Status: DC | PRN

## 2011-11-07 MED ORDER — MIDAZOLAM HCL 5 MG/5ML IJ SOLN
INTRAMUSCULAR | Status: DC | PRN
  Administered 2011-11-07: 2 mg via INTRAVENOUS

## 2011-11-07 MED ORDER — PROPOFOL 10 MG/ML IV EMUL
INTRAVENOUS | Status: DC | PRN
  Administered 2011-11-07: 200 mg via INTRAVENOUS

## 2011-11-07 SURGICAL SUPPLY — 48 items
APL SKNCLS STERI-STRIP NONHPOA (GAUZE/BANDAGES/DRESSINGS) ×1
BENZOIN TINCTURE PRP APPL 2/3 (GAUZE/BANDAGES/DRESSINGS) ×2 IMPLANT
BLADE SURG 10 STRL SS (BLADE) ×2 IMPLANT
BLADE SURG ROTATE 9660 (MISCELLANEOUS) ×2 IMPLANT
CHLORAPREP W/TINT 26ML (MISCELLANEOUS) ×2 IMPLANT
CLEANER CAUTERY TIP 5X5 PAD (MISCELLANEOUS) ×1 IMPLANT
CLOTH BEACON ORANGE TIMEOUT ST (SAFETY) ×2 IMPLANT
COVER MAYO STAND STRL (DRAPES) ×2 IMPLANT
COVER TABLE BACK 60X90 (DRAPES) ×2 IMPLANT
DECANTER SPIKE VIAL GLASS SM (MISCELLANEOUS) ×1 IMPLANT
DRAIN PENROSE 1/2X12 LTX STRL (WOUND CARE) ×2 IMPLANT
DRAPE LAPAROTOMY T 102X78X121 (DRAPES) ×2 IMPLANT
DRAPE UTILITY XL STRL (DRAPES) ×2 IMPLANT
ELECT REM PT RETURN 9FT ADLT (ELECTROSURGICAL) ×2
ELECTRODE REM PT RTRN 9FT ADLT (ELECTROSURGICAL) ×1 IMPLANT
GAUZE SPONGE 4X4 12PLY STRL LF (GAUZE/BANDAGES/DRESSINGS) IMPLANT
GLOVE BIOGEL M 7.0 STRL (GLOVE) ×1 IMPLANT
GLOVE BIOGEL PI IND STRL 7.5 (GLOVE) IMPLANT
GLOVE BIOGEL PI IND STRL 8 (GLOVE) IMPLANT
GLOVE BIOGEL PI INDICATOR 7.5 (GLOVE) ×1
GLOVE BIOGEL PI INDICATOR 8 (GLOVE) ×1
GLOVE SURG SIGNA 7.5 PF LTX (GLOVE) ×2 IMPLANT
GLOVE SURG SS PI 8.0 STRL IVOR (GLOVE) ×1 IMPLANT
GOWN PREVENTION PLUS XLARGE (GOWN DISPOSABLE) ×3 IMPLANT
GOWN STRL REIN 2XL LVL4 (GOWN DISPOSABLE) ×1 IMPLANT
MESH ULTRAPRO 3X6 7.6X15CM (Mesh General) ×1 IMPLANT
NDL HYPO 25X1 1.5 SAFETY (NEEDLE) ×1 IMPLANT
NEEDLE HYPO 25X1 1.5 SAFETY (NEEDLE) ×2 IMPLANT
NS IRRIG 1000ML POUR BTL (IV SOLUTION) ×1 IMPLANT
PACK BASIN DAY SURGERY FS (CUSTOM PROCEDURE TRAY) ×2 IMPLANT
PAD CLEANER CAUTERY TIP 5X5 (MISCELLANEOUS) ×1
PENCIL BUTTON HOLSTER BLD 10FT (ELECTRODE) ×2 IMPLANT
SLEEVE SCD COMPRESS KNEE MED (MISCELLANEOUS) ×1 IMPLANT
SPONGE INTESTINAL PEANUT (DISPOSABLE) ×1 IMPLANT
STRIP CLOSURE SKIN 1/2X4 (GAUZE/BANDAGES/DRESSINGS) ×2 IMPLANT
SUT CHROMIC 2 0 SH (SUTURE) IMPLANT
SUT MON AB 5-0 PS2 18 (SUTURE) ×2 IMPLANT
SUT NOVA 0 T19/GS 22DT (SUTURE) ×5 IMPLANT
SUT VIC AB 0 SH 27 (SUTURE) IMPLANT
SUT VIC AB 2-0 SH 27 (SUTURE) ×2
SUT VIC AB 2-0 SH 27XBRD (SUTURE) IMPLANT
SUT VICRYL 3-0 CR8 SH (SUTURE) ×2 IMPLANT
SUT VICRYL AB 2 0 TIE (SUTURE) IMPLANT
SUT VICRYL AB 2 0 TIES (SUTURE)
SYR CONTROL 10ML LL (SYRINGE) ×2 IMPLANT
TOWEL OR 17X24 6PK STRL BLUE (TOWEL DISPOSABLE) ×3 IMPLANT
TOWEL OR NON WOVEN STRL DISP B (DISPOSABLE) ×2 IMPLANT
WATER STERILE IRR 1000ML POUR (IV SOLUTION) ×1 IMPLANT

## 2011-11-07 NOTE — Anesthesia Postprocedure Evaluation (Signed)
Anesthesia Post Note  Patient: Eric Rhodes Bur  Procedure(s) Performed: Procedure(s) (LRB): HERNIA REPAIR INGUINAL ADULT (Left)  Anesthesia type: general  Patient location: PACU  Post pain: Pain level controlled  Post assessment: Patient's Cardiovascular Status Stable  Last Vitals:  Filed Vitals:   11/07/11 1119  BP: 131/67  Pulse: 68  Temp: 36.6 C  Resp: 20    Post vital signs: Reviewed and stable  Level of consciousness: sedated  Complications: No apparent anesthesia complications

## 2011-11-07 NOTE — Transfer of Care (Signed)
Immediate Anesthesia Transfer of Care Note  Patient: Eric Rhodes  Procedure(s) Performed: Procedure(s) (LRB): HERNIA REPAIR INGUINAL ADULT (Left)  Patient Location: PACU  Anesthesia Type: General  Level of Consciousness: awake  Airway & Oxygen Therapy: Patient Spontanous Breathing and Patient connected to face mask oxygen  Post-op Assessment: Report given to PACU RN and Post -op Vital signs reviewed and stable  Post vital signs: Reviewed and stable  Complications: No apparent anesthesia complications

## 2011-11-07 NOTE — Anesthesia Preprocedure Evaluation (Signed)
Anesthesia Evaluation  Patient identified by MRN, date of birth, ID band Patient awake    Reviewed: Allergy & Precautions, H&P , NPO status , Patient's Chart, lab work & pertinent test results  Airway Mallampati: II TM Distance: >3 FB Neck ROM: Full    Dental   Pulmonary          Cardiovascular     Neuro/Psych    GI/Hepatic GERD-  Controlled,  Endo/Other    Renal/GU      Musculoskeletal   Abdominal   Peds  Hematology   Anesthesia Other Findings   Reproductive/Obstetrics                           Anesthesia Physical Anesthesia Plan  ASA: II  Anesthesia Plan: General   Post-op Pain Management:    Induction: Intravenous  Airway Management Planned: LMA  Additional Equipment:   Intra-op Plan:   Post-operative Plan: Extubation in OR  Informed Consent: I have reviewed the patients History and Physical, chart, labs and discussed the procedure including the risks, benefits and alternatives for the proposed anesthesia with the patient or authorized representative who has indicated his/her understanding and acceptance.     Plan Discussed with: CRNA and Surgeon  Anesthesia Plan Comments:         Anesthesia Quick Evaluation

## 2011-11-07 NOTE — Op Note (Signed)
NAME:  Polansky, Arcenio                ACCOUNT NO.:  MEDICAL RECORD NO.:  [id number]  LOCATION:                                 FACILITY:  PHYSICIAN:  Sandria Bales. Ezzard Standing, M.D.  DATE OF BIRTH:  01/15/1956  DATE OF PROCEDURE:  11/07/2011                              OPERATIVE REPORT  PREOPERATIVE DIAGNOSES:  Left inguinal hernia.  POSTOPERATIVE DIAGNOSIS:  Medium to large direct left inguinal hernia.  PROCEDURES:  Open left inguinal hernia repair with Ultrapro mesh.  SURGEONS:  Sandria Bales. Ezzard Standing, MD.  FIRST ASSISTANT:  None.  ANESTHESIA:  General LMA.  Local anesthetic is 30 mL of 0.25% Marcaine.  ESTIMATED BLOOD LOSS:  Minimal.  INDICATION OF PROCEDURE:  Eric Rhodes is a 56 year old white male, whose primary care doctor is Dr. Sigmund Hazel, who comes with a symptomatic left inguinal hernia.  I discussed with the patient by Repairing the inguinal hernia.  The potential complications of surgery include, but are not limited to, bleeding, infection, nerve injury, and recurrence of the hernia.  OPERATIVE NOTE:  The patient was in room 8 at [facility] Day surgery with a general anesthesia, supervised by Dr. Arta Bruce.  A time out was held and the surgical checklist run.  His left groin was shaved, prepped with ChloraPrep and sterilely draped.  A left inguinal incision was made to the external oblique fascia.  The external oblique fascia was splayed at the internal ring.  I opened the external oblique fascia, encircled the cord structures with a Penrose drain, tried to preserve the ilioinguinal nerve.  Dissecting off the cord structures, the patient had a large direct inguinal hernia with most of the inguinal floor weak in a hernia that was probably 4-5 cm in diameter, protruding 5-6 cm.  This would be considered a medium-to-large direct inguinal hernia.  I then dissected out the internal ring.  I could find no evidence of an indirect inguinal hernia, and tried to skeletonize the  cord structures.  I then carried out a inguinal floor repair.  First, I imbricated the hernia using a running 2-0 Vicryl suture, and then over this for the inguinal floor, I laid a piece of Ultrapro mesh that was cut to about 5 x 2- 1/2 inches.  It was sewn medially to the pubic tubercle, inferiorly to the shelving edge of inguinal ligament, superiorly to transversalis fascia.  A keyhole was cut for the internal ring and the mesh was sewn behind the keyhole allowing a space where a large enough for the cord structures to go through.  I used 0 Novafil to sew the mesh in place.  The wound was then irrigated.  I then returned the cord structures in normal location.  I closed the external oblique fascia with interrupted 3-0 Vicryl sutures.  I then infiltrated the deep spaces and superficial spaces with 0.25% Marcaine.  Using a total of 30 mL of 0.25% Marcaine plain.  I then closed the subcutaneous tissues with 3-0 Vicryl suture. The skin with a 5-0 Monocryl suture and then covered the wound with Dermabond.  The patient tolerated the procedure well, was transported to recovery room in good condition, will be discharged home today  to see me back in 2-3 weeks for followup.   Sandria Bales. Ezzard Standing, M.D., FACS   DHN/MEDQ  D:  11/07/2011  T:  11/07/2011  Job:  [id number]  cc:   Sigmund Hazel, M.D. Griffith Citron, M.D.

## 2011-11-07 NOTE — Discharge Instructions (Signed)
DISCHARGE INSTRUCTIONS TO PATIENT  Activity:  Driving - May drive in 2 to 4 days, as long as you are doing well.    Lifting - no lifting > 15 pounds for 4 weeks.  Wound Care:   Leave incision dry for 2 days, then may shower.  Diet:  As tolerated  Follow up appointment:  Call Dr. Allene Pyo office [facility]) at [phone] for an appointment in 2 to 3 weeks.  Medications and dosages:  Resume your home medications.  You have a prescription for:  Vicodin.  Call Dr. Ezzard Standing or his office  [phone]) if you have:  Temperature greater than 100.4,  Persistent nausea and vomiting,  Severe uncontrolled pain,  Redness, tenderness, or signs of infection (pain, swelling, redness, odor or green/yellow discharge around the site),  Difficulty breathing, headache or visual disturbances,  Any other questions or concerns you may have after discharge.  In an emergency, call 911 or go to an Emergency Department at a nearby hospital.   [facility]  [address] [place], [place] [address] [phone]    Post Anesthesia Home Care Instructions  Activity: Get plenty of rest for the remainder of the day. A responsible adult should stay with you for 24 hours following the procedure.  For the next 24 hours, DO NOT: -Drive a car -Advertising copywriter -Drink alcoholic beverages -Take any medication unless instructed by your physician -Make any legal decisions or sign important papers.  Meals: Start with liquid foods such as gelatin or soup. Progress to regular foods as tolerated. Avoid greasy, spicy, heavy foods. If nausea and/or vomiting occur, drink only clear liquids until the nausea and/or vomiting subsides. Call your physician if vomiting continues.  Special Instructions/Symptoms: Your throat may feel dry or sore from the anesthesia or the breathing tube placed in your throat during surgery. If this causes discomfort, gargle with warm salt water. The  discomfort should disappear within 24 hours.

## 2011-11-07 NOTE — Brief Op Note (Signed)
11/07/2011  9:20 AM  PATIENT:  Eric Rhodes, 56 y.o., male, MRN: [id number]  PREOP DIAGNOSIS:  Left inguinal hernia  POSTOP DIAGNOSIS:   Medium to large direct left inguinal hernia  PROCEDURE:   Procedure(s):open Left HERNIA REPAIR INGUINAL ADULT with Ultrapro mesh  SURGEON:   Ovidio Kin, M.D.  ASSISTANT:   none  ANESTHESIA:   general  Aubery Lapping, MD - Anesthesiologist Sharyne Richters, CRNA - CRNA Gladys Damme, CRNA - CRNA  General  EBL:  minimal  ml  BLOOD ADMINISTERED: none  DRAINS: none   LOCAL MEDICATIONS USED:   30 cc 1/4% marcaine  SPECIMEN:   none  COUNTS CORRECT:  YES  INDICATIONS FOR PROCEDURE:  Eric Rhodes is a 56 y.o. (DOB: 02/08/1956) white male whose primary care physician is Neldon Labella, MD, MD and comes for left inguinal hernia repair.   The indications and risks of the surgery were explained to the patient.  The risks include, but are not limited to, infection, bleeding, and nerve injury.  Note dictated to:   [phone]

## 2011-11-07 NOTE — H&P (Signed)
Re: Eric Rhodes  DOB: [birth year]  MRN: [id number]   ASSESSMENT AND PLAN:  1. Left inguinal hernia, medium sized   I discussed the indications and complications of hernia surgery with the patient. I discussed both the laparoscopic and open approach to hernia repair.. The potential risks of hernia surgery include, but are not limited to, bleeding, infection, open surgery, nerve injury, and recurrence of the hernia. I provided the patient literature about hernia surgery. He asked a lot of appropriate questions about hernia repairs.   He decided to go with an open repair.   He wants to do this in the first week of March.  2. GERD.  3. Irritable bowel disease.   Chief Complaint   Patient presents with   .  Inguinal Hernia    REFERRING PHYSICIAN: Ritta Slot, MD.  HISTORY OF PRESENT ILLNESS:  Eric Rhodes is a 56 y.o. (DOB: Aug 15, 1956) white male whose primary care physician is Neldon Labella, MD, MD and comes to me today for left inguinal hernia.  The patient has been doing a power program with his son which involved lifting heavier weights. He noticed after one work out, a bulge in his left groin. He is having no pain other than the bulge. He has no prior history of hernia repair.  Patient with no history of stomach disease. No history of liver disease. No history of gall bladder disease. No history of pancreas disease. No history of colon disease. He had a negative colonoscopy by Dr. Kinnie Scales in 2004.   Past Medical History   Diagnosis  Date   .  GERD (gastroesophageal reflux disease)      diet controlled   .  History of colonoscopy  2004   .  History of endoscopy  2004   .  Irritable bowel disease     Past Surgical History   Procedure  Date   .  Anterior cruciate ligament repair  1978    Current Outpatient Prescriptions   Medication  Sig  Dispense  Refill   .  aspirin 81 MG EC tablet  Take 81 mg by mouth daily. Swallow whole.      Allergies   Allergen  Reactions     .  Betadine (Povidone Iodine)      Skin sensitive    REVIEW OF SYSTEMS:  Skin: No history of rash. No history of abnormal moles.  Infection: No history of hepatitis or HIV. No history of MRSA.  Neurologic: No history of stroke. No history of seizure. No history of headaches.  Cardiac: No history of hypertension. No history of heart disease. No history of prior cardiac catheterization. No history of seeing a cardiologist.  Pulmonary: Does not smoke cigarettes. No asthma or bronchitis. No OSA/CPAP.  Endocrine: No diabetes. No thyroid disease.  Gastrointestinal: See HPI.  Urologic: No history of kidney stones. No history of bladder infections.  Musculoskeletal: Left ACL repair, 1978. He does a lot of biking. We talked about the [employer].  Hematologic: No bleeding disorder. No history of anemia. Not anticoagulated.  Psycho-social: The patient is oriented. The patient has no obvious psychologic or social impairment to understanding our conversation and plan.   SOCIAL and FAMILY HISTORY:  Works as a Acupuncturist.  Married.   PHYSICAL EXAM:   General: Stocky, short, WN WM who is alert and generally healthy appearing.  HEENT: Normal. Pupils equal. Good dentition.  Neck: Supple. No mass. No thyroid mass. Carotid pulse okay with no bruit.  Lymph Nodes:  No supraclavicular or cervical nodes.  Lungs: Clear to auscultation and symmetric breath sounds.  Heart: RRR. No murmur or rub.   Abdomen: Soft. No mass. No tenderness. Normal bowel sounds. No abdominal scars. Left inguinal hernia, medium in size. Reducible. I do not feel a hernia on the right.  Rectal: Not done.  Extremities: Good strength and ROM in upper and lower extremities.  Neurologic: Grossly intact to motor and sensory function.  Psychiatric: Has normal mood and affect. Behavior is normal.   DATA REVIEWED:  Notes he filled out   Ovidio Kin, MD, [facility], [place]  [address] [place]  [place],  [place] [address]  Phone: [phone] FAX: [phone]

## 2011-11-08 ENCOUNTER — Encounter (HOSPITAL_BASED_OUTPATIENT_CLINIC_OR_DEPARTMENT_OTHER): Payer: Self-pay | Admitting: Surgery

## 2011-11-20 ENCOUNTER — Ambulatory Visit (INDEPENDENT_AMBULATORY_CARE_PROVIDER_SITE_OTHER): Payer: Commercial Managed Care - PPO | Admitting: Surgery

## 2011-11-20 ENCOUNTER — Encounter (INDEPENDENT_AMBULATORY_CARE_PROVIDER_SITE_OTHER): Payer: Self-pay | Admitting: Surgery

## 2011-11-20 DIAGNOSIS — K4090 Unilateral inguinal hernia, without obstruction or gangrene, not specified as recurrent: Secondary | ICD-10-CM

## 2011-11-20 NOTE — Progress Notes (Signed)
   Re:   Eric Rhodes DOB:   1955/12/03 MRN:   [id number]  ASSESSMENT AND PLAN: 1.  Left inguinal hernia, medium sized.  Repaired 11/07/2011.  Has done well. Some numbness under incision.  He has watched his activity carefully.  May go to full activity one month post op.  Return to me on a PRN basis.   2.  GERD. 3.  Irritable bowel disease.  REFERRING PHYSICIAN:   Ritta Slot, MD.  HISTORY OF PRESENT ILLNESS: Eric Rhodes is a 56 y.o. (DOB: Oct 26, 1955)  white male whose primary care physician is Neldon Labella, MD, MD and comes to me today for follow up of left inguinal hernia surgery.  He had an open left inguinal hernia repair on 11/07/2011 and has done well.  We talked about dermabond, peri-incision numbness, and exercise.  He has a good handle on what to do going forward.   Past Medical History  Diagnosis Date  . GERD (gastroesophageal reflux disease)     diet controlled  . History of colonoscopy 2004  . History of endoscopy 2004  . Irritable bowel disease       Current Outpatient Prescriptions  Medication Sig Dispense Refill  . aspirin 81 MG EC tablet Take 81 mg by mouth daily. Swallow whole.      . famotidine (PEPCID) 20 MG tablet Take 20 mg by mouth 2 (two) times daily as needed.      . therapeutic multivitamin-minerals (THERAGRAN-M) tablet Take 1 tablet by mouth daily.          Allergies  Allergen Reactions  . Betadine (Povidone Iodine)     Skin sensitive    REVIEW OF SYSTEMS: Musculoskeletal:  Left ACL repair, 1978.  He does a lot of biking.  We talked about the [employer].  SOCIAL and FAMILY HISTORY: Works as a Acupuncturist.  Already back at work. Married.  PHYSICAL EXAM: BP 121/81  Pulse 74  Temp(Src) 97.2 F (36.2 C) (Temporal)  Resp 16  Ht 5' 3.5" (1.613 m)  Wt 170 lb (77.111 kg)  BMI 29.64 kg/m2  Abdomen: Right groin incision looks good.  No mass or swelling.  Ovidio Kin, MD,  [facility] [address]  [place]  [address]   [place], [place]    [address] Phone:  [phone] FAX:  [phone]

## 2013-06-11 ENCOUNTER — Ambulatory Visit (INDEPENDENT_AMBULATORY_CARE_PROVIDER_SITE_OTHER): Payer: BC Managed Care – PPO | Admitting: *Deleted

## 2013-06-11 DIAGNOSIS — Z23 Encounter for immunization: Secondary | ICD-10-CM

## 2014-10-03 HISTORY — PX: OTHER SURGICAL HISTORY: SHX169

## 2015-11-15 MED FILL — DOXYCYCLINE HYCLATE 100 MG: 100 | 10 days supply | Qty: 20 | Fill #0

## 2015-11-15 MED FILL — VENTOLIN HFA 90 MCG INHALER: 108 (90 BAS | 20 days supply | Qty: 18 | Fill #0

## 2015-11-15 MED FILL — AEROCHAMBER: 1 days supply | Qty: 1 | Fill #0

## 2016-01-03 DIAGNOSIS — J209 Acute bronchitis, unspecified: Secondary | ICD-10-CM | POA: Diagnosis not present

## 2016-01-03 DIAGNOSIS — R918 Other nonspecific abnormal finding of lung field: Secondary | ICD-10-CM | POA: Diagnosis not present

## 2016-01-04 ENCOUNTER — Other Ambulatory Visit: Payer: Self-pay | Admitting: Family Medicine

## 2016-01-04 DIAGNOSIS — R918 Other nonspecific abnormal finding of lung field: Secondary | ICD-10-CM

## 2016-01-12 ENCOUNTER — Ambulatory Visit
Admission: RE | Admit: 2016-01-12 | Discharge: 2016-01-12 | Disposition: A | Discharge status: Home / Self Care | Payer: BLUE CROSS/BLUE SHIELD | Source: Ambulatory Visit | Attending: Family Medicine | Admitting: Family Medicine

## 2016-01-12 DIAGNOSIS — R918 Other nonspecific abnormal finding of lung field: Secondary | ICD-10-CM

## 2016-01-12 IMAGING — CT CT CHEST W/O CM
2 of 4 series · 15 of 36 positions shown, 18 images · non-contrast
Comparison: CT abdomen and pelvis [DATE]

CLINICAL DATA: Pulmonary nodules.  Follow-up.

EXAM:
CT CHEST WITHOUT CONTRAST
TECHNIQUE: Multidetector CT imaging of the chest was performed following the
standard protocol without IV contrast.

[Series 2: chest w/(date) · axial · 0.61mm/px · z∈[+1058,+1298]mm · 12 of 140 slices shown, 15 images]
[im 10/140  mediastinal]
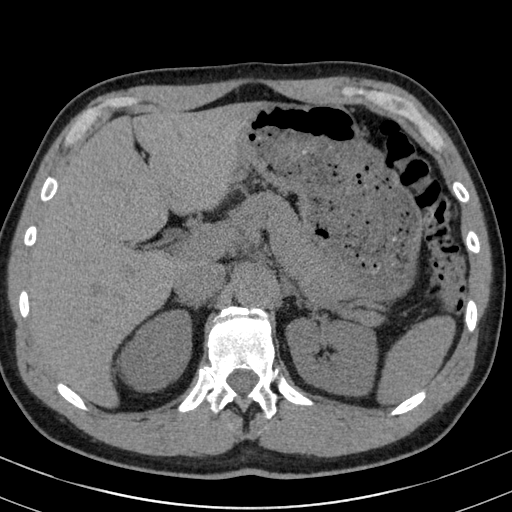
[im 10/140  lung]
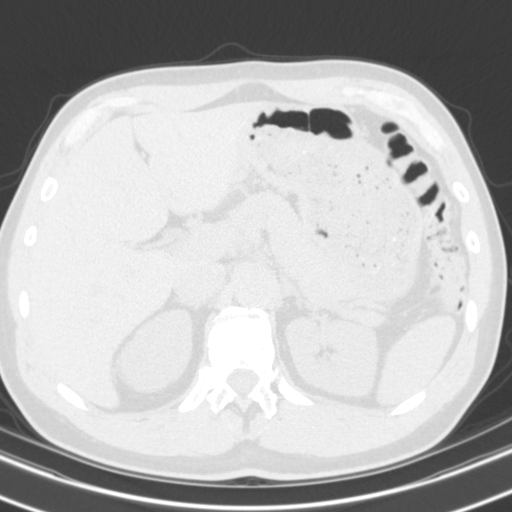
[im 20/140  lung]
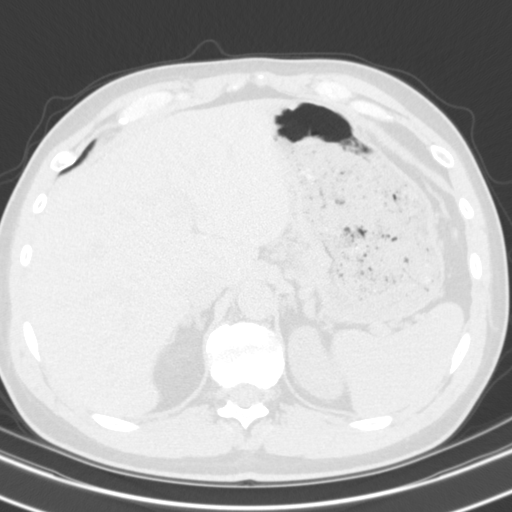
[im 30/140  lung]
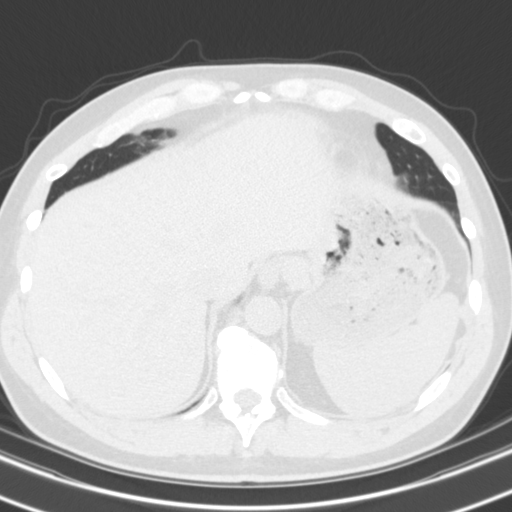
[im 40/140  lung]
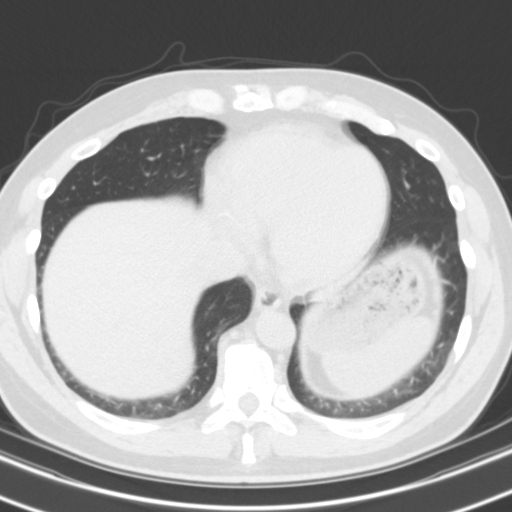
[im 50/140  mediastinal]
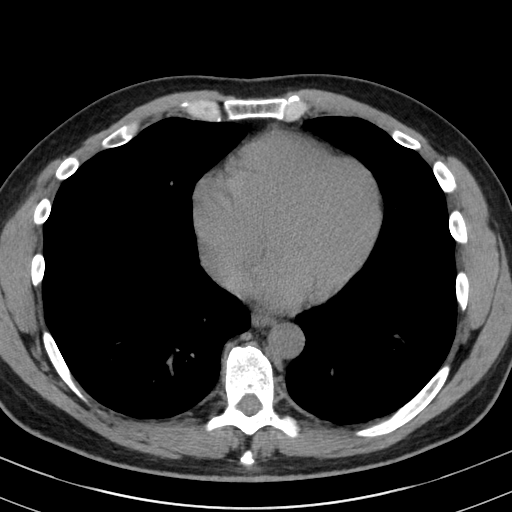
[im 50/140  lung]
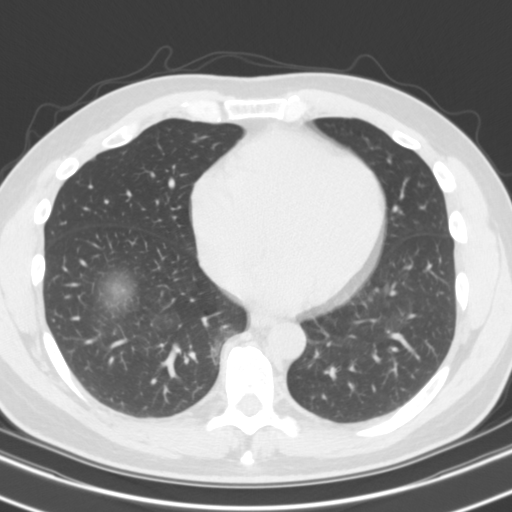
[im 60/140  lung]
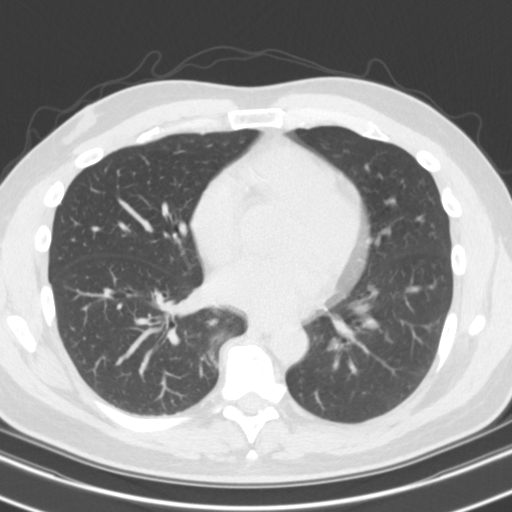
[im 80/140  lung]
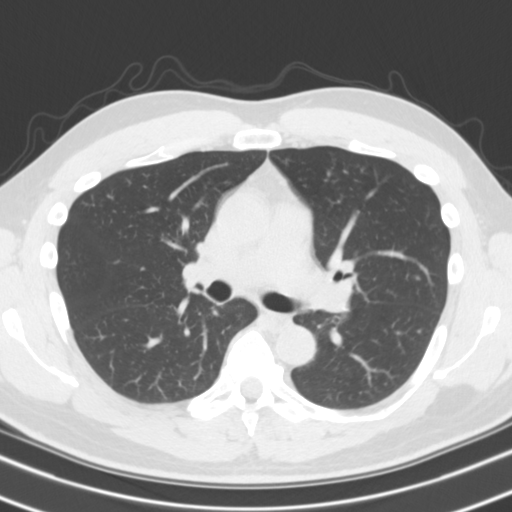
[im 90/140  lung]
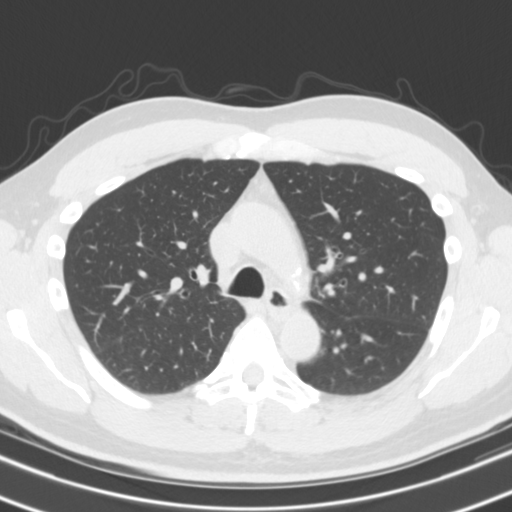
[im 100/140  mediastinal]
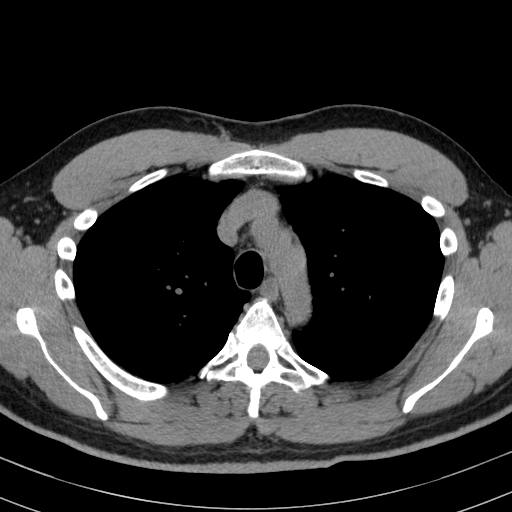
[im 100/140  lung]
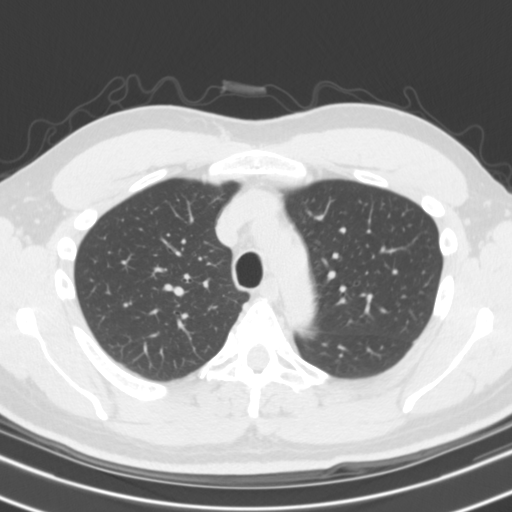
[im 110/140  lung]
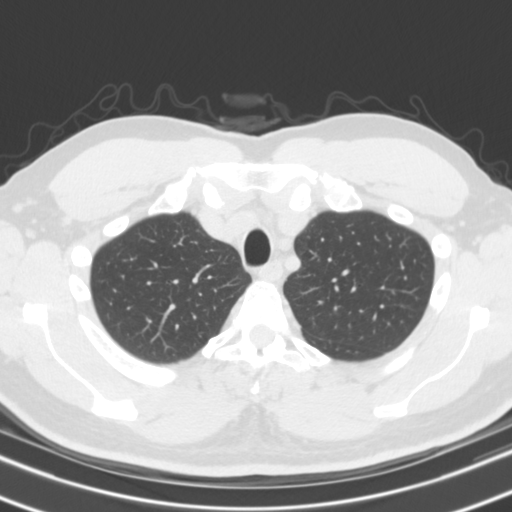
[im 120/140  lung]
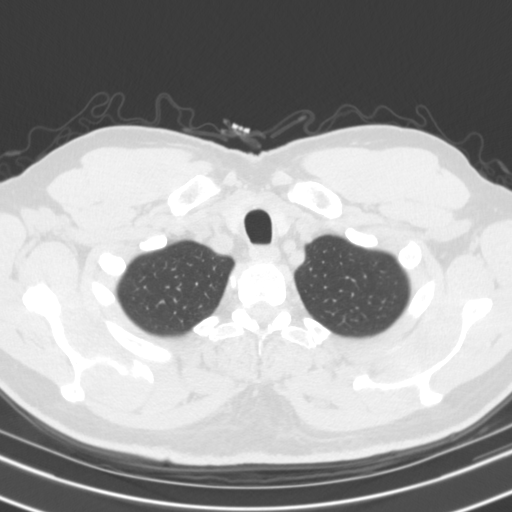
[im 130/140  lung]
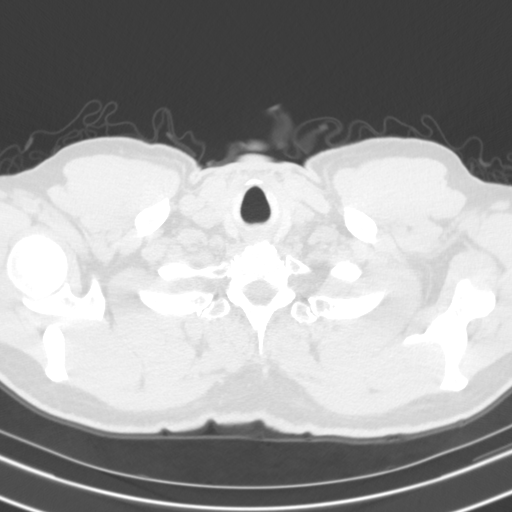

[Series 3: cor · coronal · 0.55mm/px · 3 of 127 slices shown]
[im 26/127  lung]
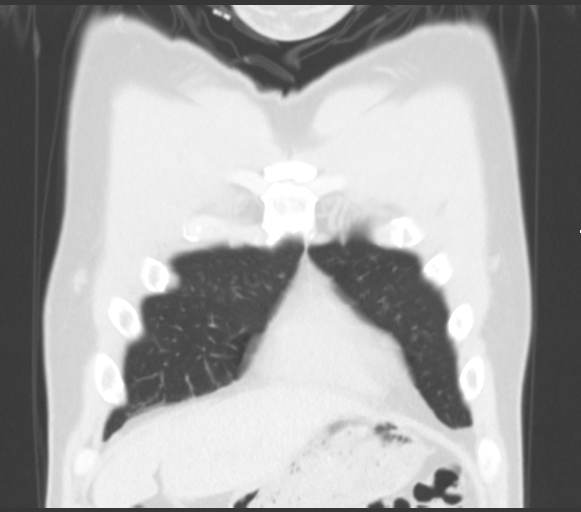
[im 51/127  lung]
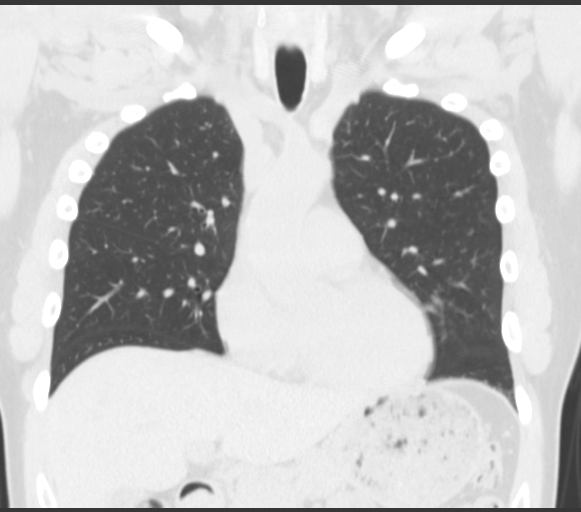
[im 76/127  lung]
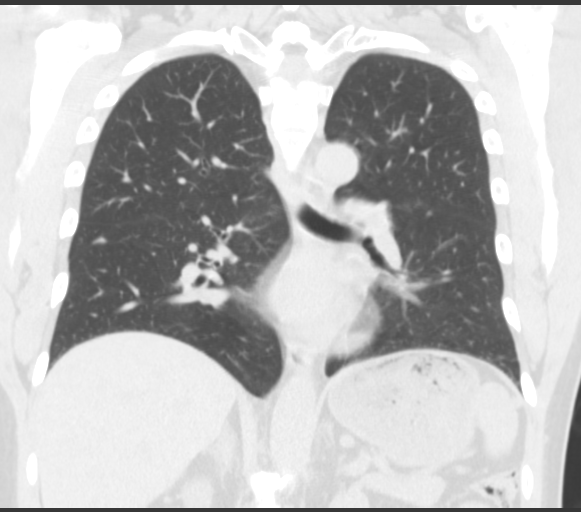

[15 of 36 positions shown; findings below may reference images not displayed]

FINDINGS: Mediastinum/Lymph Nodes: No masses or pathologically enlarged lymph
nodes identified on this un-enhanced exam. Left main, lad, and right
coronary artery calcification. Normal heart size.

Lungs/Pleura: No pleural effusion. Major airways are patent. 8 x 4
mm nodule in the right upper lobe (series 5, image 37), 4 mm nodule
in the anterior right upper lobe (series 5, image 44), 4 mm nodule
in the posterior right upper lobe (series 5, image 45), two 8 x 4 mm
nodular densities along the right major fissure (series 5 images 65
and 66), 4 mm nodule along the right minor fissure (series 5, image
80), 4 mm anterior subpleural right middle lobe nodule (series 5,
image 84), 2 mm right middle lobe nodule (series 5, image 85), 2 mm
apical left upper lobe nodule (series 5, image 19), 3 mm nodule
along the superior aspect of the left major fissure (series 5, image
27), and 4 mm subpleural anterior left upper lobe nodule (series 5,
image 46) were not included on the prior abdominal CT. 3 mm
subpleural right middle lobe nodule (series 5, image 90) and 5 mm
basilar left lower lobe nodule (series 5, image 97) are unchanged.
There is no airspace consolidation.

Upper abdomen: Unremarkable.

Musculoskeletal: Mild thoracic spondylosis. No suspicious lytic or
blastic osseous lesion.
IMPRESSION: 1. Small basilar lung nodules seen on the prior abdominal CT are
unchanged.
2. Numerous small nodules are present elsewhere in both lungs
measuring up to 6 mm mean diameter. Non-contrast chest CT at 3-6
months is recommended. If the nodules are stable at time of repeat
CT, then future CT at 18-24 months (from today's scan) is considered
optional for low-risk patients, but is recommended for high-risk
patients. This recommendation follows the consensus statement:
Guidelines for Management of Incidental Pulmonary Nodules Detected
on CT Images:From the [HOSPITAL] [AN]; published online
before print (10.1148/radiol.[PHONE_NUMBER]).

## 2016-02-06 MED FILL — ALPRAZolam 0.5 MG TABS: 0.5 | 30 days supply | Qty: 30 | Fill #0

## 2016-02-06 MED FILL — ZOLPIDEM TARTRATE 10 MG TAB: 10 | 30 days supply | Qty: 30 | Fill #0

## 2016-03-08 DIAGNOSIS — Z125 Encounter for screening for malignant neoplasm of prostate: Secondary | ICD-10-CM | POA: Diagnosis not present

## 2016-03-13 DIAGNOSIS — Z125 Encounter for screening for malignant neoplasm of prostate: Secondary | ICD-10-CM | POA: Diagnosis not present

## 2016-03-13 DIAGNOSIS — R310 Gross hematuria: Secondary | ICD-10-CM | POA: Diagnosis not present

## 2016-03-18 DIAGNOSIS — Z0000 Encounter for general adult medical examination without abnormal findings: Secondary | ICD-10-CM | POA: Diagnosis not present

## 2016-03-18 DIAGNOSIS — Z23 Encounter for immunization: Secondary | ICD-10-CM | POA: Diagnosis not present

## 2016-06-12 ENCOUNTER — Other Ambulatory Visit (HOSPITAL_COMMUNITY): Payer: Self-pay | Admitting: Family Medicine

## 2016-06-12 DIAGNOSIS — R918 Other nonspecific abnormal finding of lung field: Secondary | ICD-10-CM

## 2016-06-21 ENCOUNTER — Ambulatory Visit (HOSPITAL_COMMUNITY): Payer: BLUE CROSS/BLUE SHIELD

## 2016-07-02 ENCOUNTER — Ambulatory Visit
Admission: RE | Admit: 2016-07-02 | Discharge: 2016-07-02 | Disposition: A | Discharge status: Home / Self Care | Payer: BLUE CROSS/BLUE SHIELD | Source: Ambulatory Visit | Attending: Family Medicine | Admitting: Family Medicine

## 2016-07-02 DIAGNOSIS — R918 Other nonspecific abnormal finding of lung field: Secondary | ICD-10-CM | POA: Diagnosis not present

## 2016-07-02 IMAGING — CT CT CHEST W/O CM
1 series · 15 of 34 positions shown, 19 images · non-contrast
Comparison: [DATE]

CLINICAL DATA: Follow-up lung nodules

EXAM:
CT CHEST WITHOUT CONTRAST
TECHNIQUE: Multidetector CT imaging of the chest was performed following the
standard protocol without IV contrast.

[Series 2: chest w/(date) · axial · 0.70mm/px · z∈[-254,-26]mm · 15 of 134 slices shown, 19 images]
[im 10/134  mediastinal]
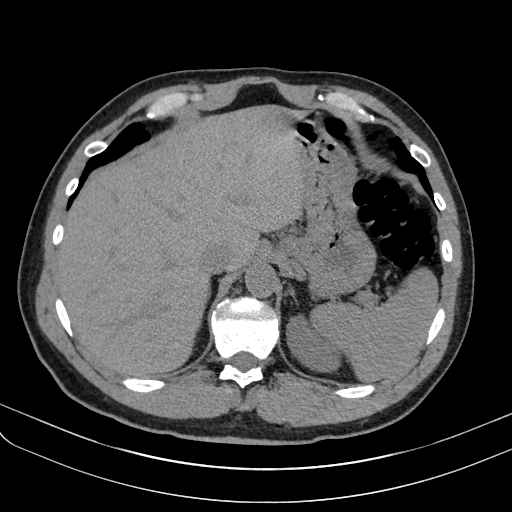
[im 10/134  lung]
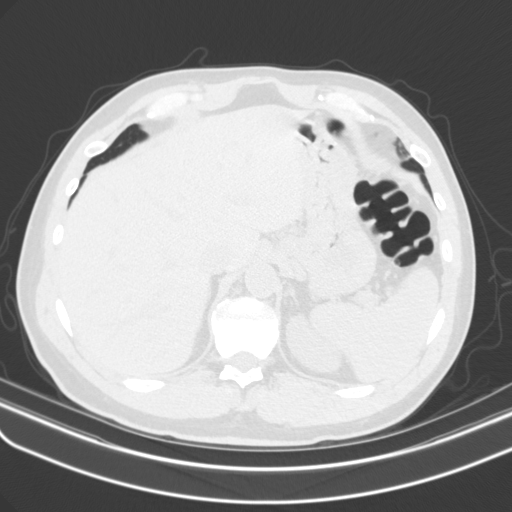
[im 20/134  lung]
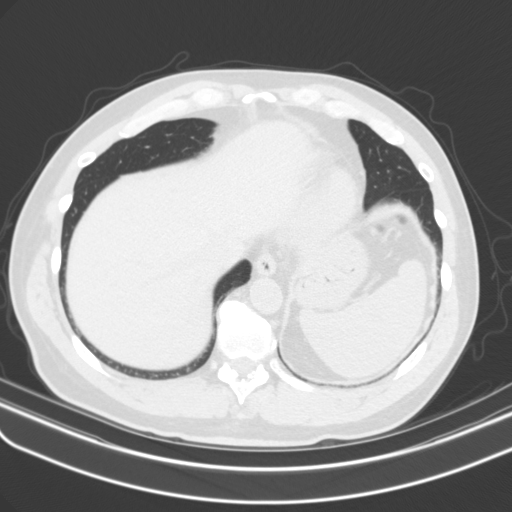
[im 27/134  lung]
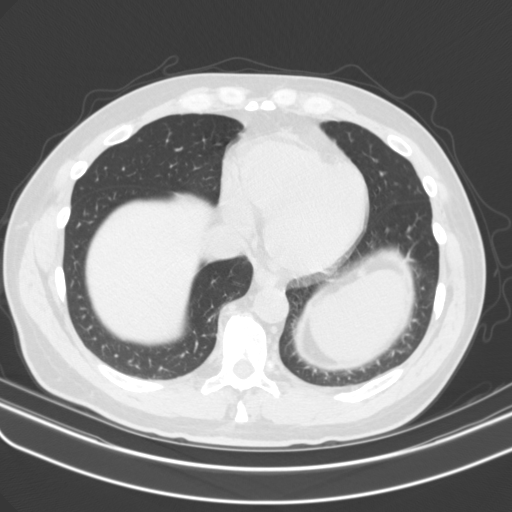
[im 35/134  lung]
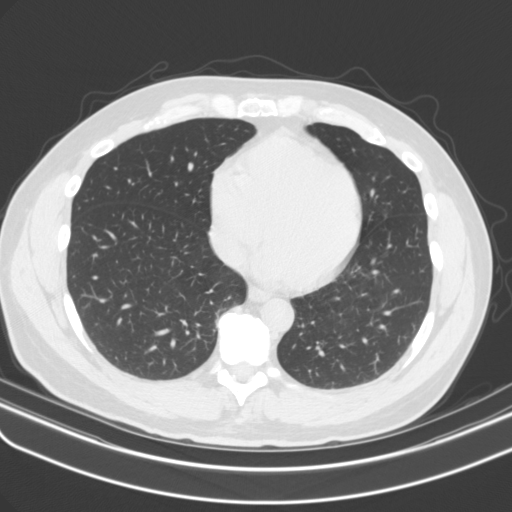
[im 45/134  mediastinal]
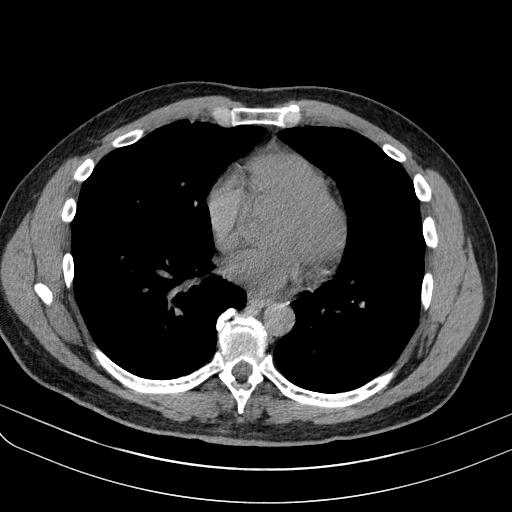
[im 45/134  lung]
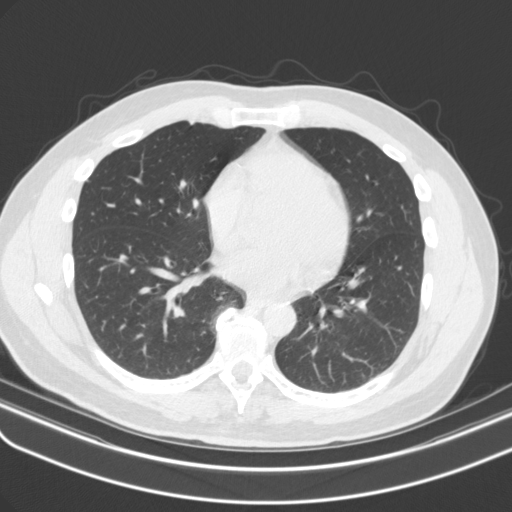
[im 54/134  lung]
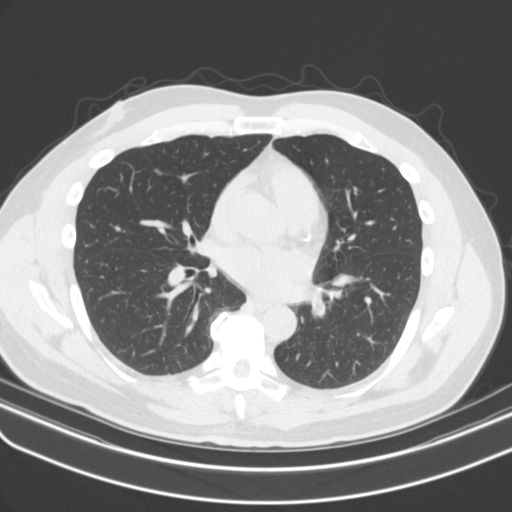
[im 60/134  lung]
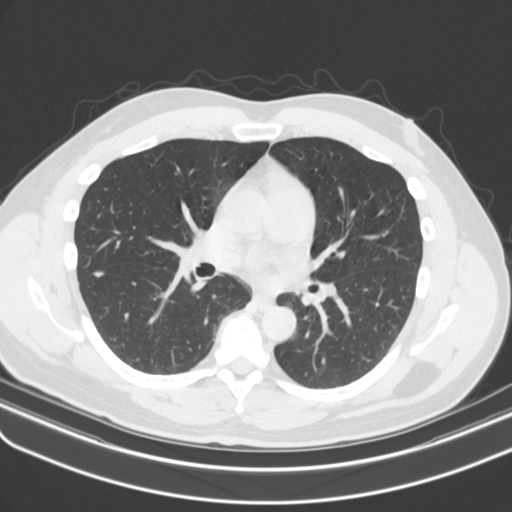
[im 69/134  lung]
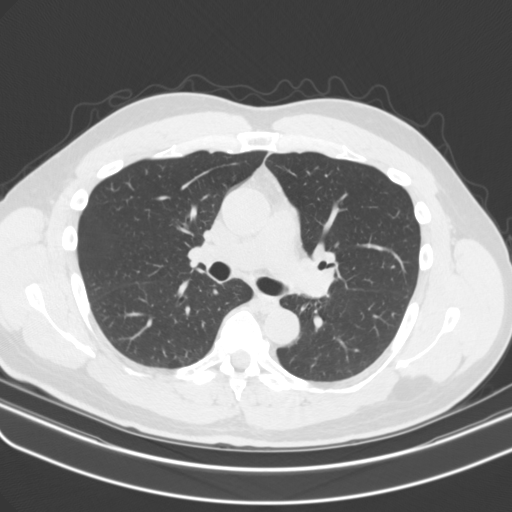
[im 74/134  mediastinal]
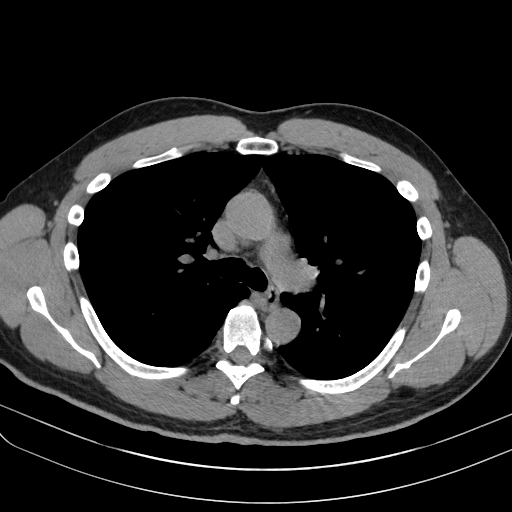
[im 74/134  lung]
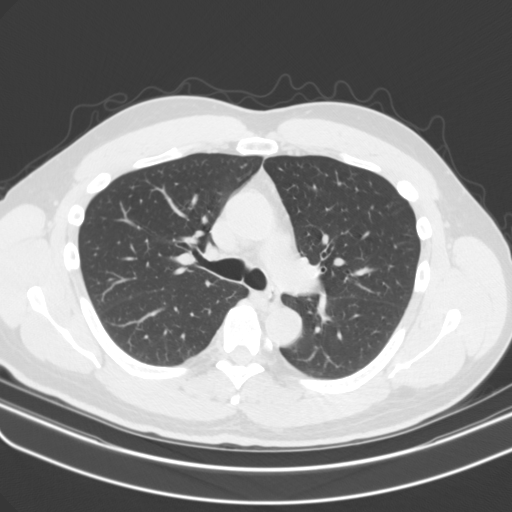
[im 80/134  lung]
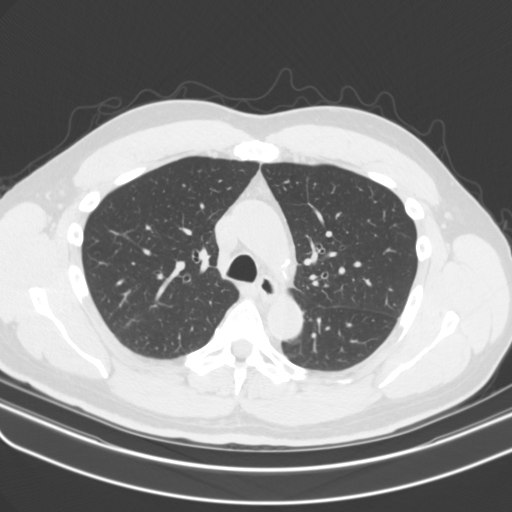
[im 89/134  lung]
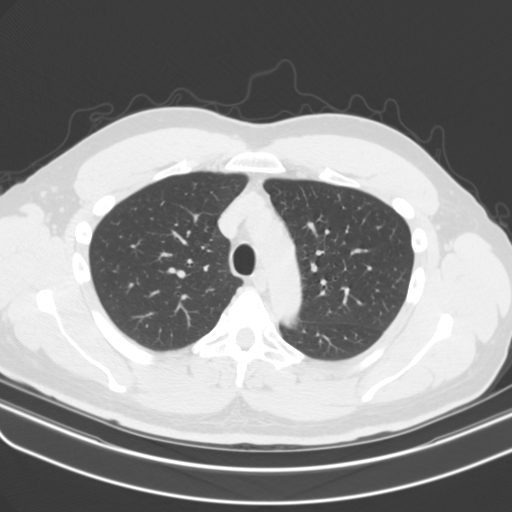
[im 99/134  lung]
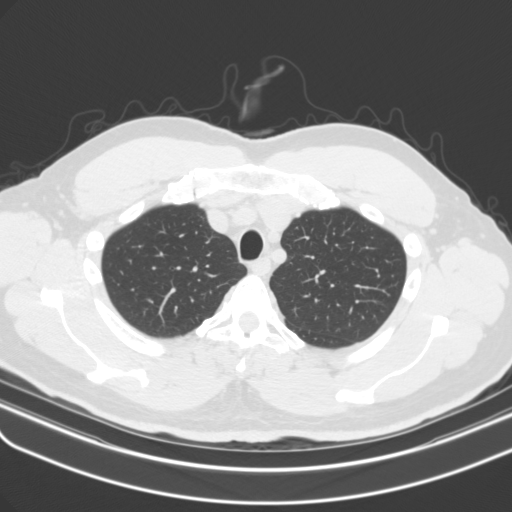
[im 107/134  mediastinal]
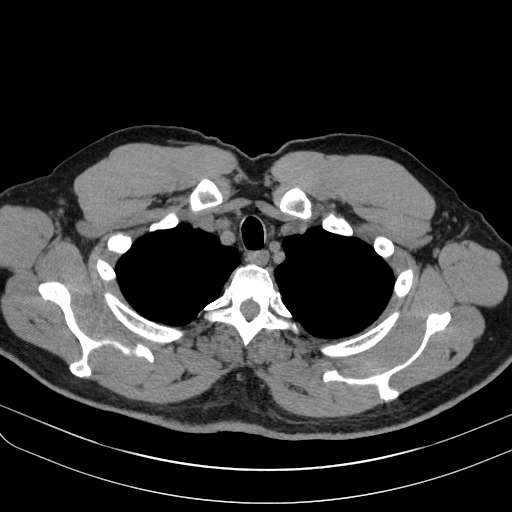
[im 107/134  lung]
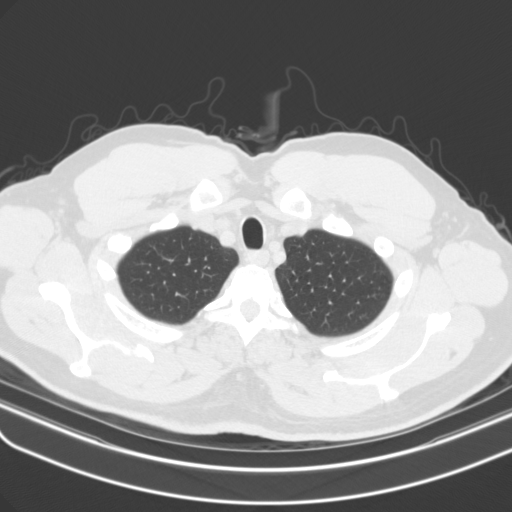
[im 114/134  lung]
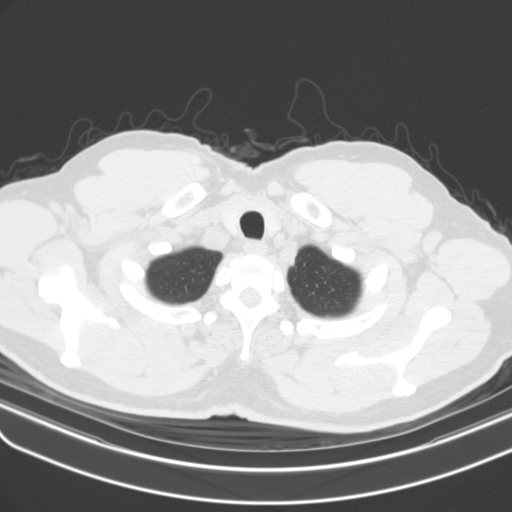
[im 124/134  lung]
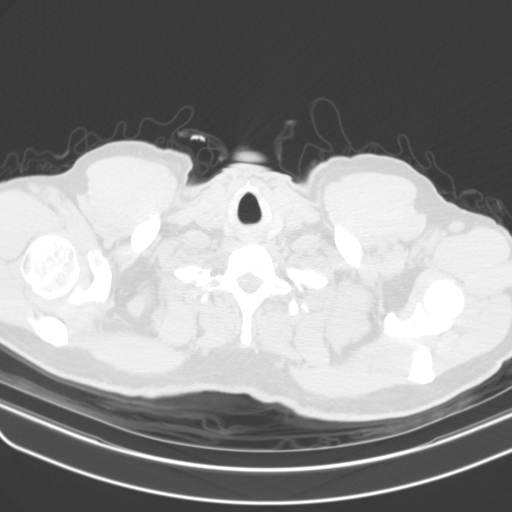

[15 of 34 positions shown; findings below may reference images not displayed]

FINDINGS: Cardiovascular: Atherosclerotic calcifications of thoracic aorta and
coronary arteries. Heart size within normal limits. No pericardial
effusion.

Mediastinum/Nodes: No mediastinal hematoma or adenopathy. No hilar
adenopathy.

Lungs/Pleura: No infiltrate or pulmonary edema. Axial image 43
stable 8 mm nodule in right upper lobe medially. Axial image 49
Stable 5 mm nodule in left upper lobe anterolaterally. Axial image
97 stable 5.6 mm nodule in left lower lobe laterally. Axial image 86
stable 4 mm nodule in right middle lobe. No new pulmonary nodules
are noted.

Upper Abdomen: The visualized upper abdomen shows no adrenal gland
mass.

Musculoskeletal: Sagittal images of the spine shows no destructive
bony lesions. Degenerative changes thoracic spine are noted.
IMPRESSION: 1. Stable bilateral pulmonary nodules as described above. The
largest nodule in right upper lobe medially measures 8 mm. Follow-up
CT scan in 6-12 months is recommended to assure stability. No new
pulmonary nodules are noted.
2. No acute infiltrate or pleural effusion.  No adenopathy.
3. Stable degenerative changes thoracic spine.

## 2016-07-11 DIAGNOSIS — Z23 Encounter for immunization: Secondary | ICD-10-CM | POA: Diagnosis not present

## 2016-07-24 DIAGNOSIS — L02411 Cutaneous abscess of right axilla: Secondary | ICD-10-CM | POA: Diagnosis not present

## 2016-07-24 DIAGNOSIS — L304 Erythema intertrigo: Secondary | ICD-10-CM | POA: Diagnosis not present

## 2016-07-24 DIAGNOSIS — L309 Dermatitis, unspecified: Secondary | ICD-10-CM | POA: Diagnosis not present

## 2016-07-24 MED FILL — FLUCONAZOLE 150 MG TABLET: 150 | 12 days supply | Qty: 4 | Fill #0

## 2016-07-24 MED FILL — TRIAMCINOLONE 0.1% OINTMENT: 0.1 | 14 days supply | Qty: 60 | Fill #0

## 2016-07-29 MED FILL — TRIAMCINOLONE 0.1% CREAM: 0.1 | 14 days supply | Qty: 60 | Fill #0

## 2016-07-29 MED FILL — CIPROFLOXACIN HCL 500 MG TA: 500 | 10 days supply | Qty: 20 | Fill #0

## 2016-08-01 DIAGNOSIS — L304 Erythema intertrigo: Secondary | ICD-10-CM | POA: Diagnosis not present

## 2016-08-01 DIAGNOSIS — L309 Dermatitis, unspecified: Secondary | ICD-10-CM | POA: Diagnosis not present

## 2016-08-01 MED FILL — hydrOXYzine HCL 25 MG TABS: 25 | 30 days supply | Qty: 90 | Fill #0

## 2016-08-01 MED FILL — KETOCONAZOLE 2% CREAM: 2 | 14 days supply | Qty: 30 | Fill #0

## 2016-08-12 DIAGNOSIS — L249 Irritant contact dermatitis, unspecified cause: Secondary | ICD-10-CM | POA: Diagnosis not present

## 2016-08-12 DIAGNOSIS — B3749 Other urogenital candidiasis: Secondary | ICD-10-CM | POA: Diagnosis not present

## 2016-08-12 MED FILL — FLUCONAZOLE 150 MG TABLET: 150 | 12 days supply | Qty: 4 | Fill #0

## 2016-08-12 MED FILL — SILVADENE 1% CREAM: 1 | 14 days supply | Qty: 50 | Fill #0

## 2016-08-15 DIAGNOSIS — L309 Dermatitis, unspecified: Secondary | ICD-10-CM | POA: Diagnosis not present

## 2016-08-15 DIAGNOSIS — L302 Cutaneous autosensitization: Secondary | ICD-10-CM | POA: Diagnosis not present

## 2016-08-15 MED FILL — predniSONE 10 MG TABS: 10 | 12 days supply | Qty: 30 | Fill #0

## 2016-08-16 MED FILL — ALPRAZolam 0.5 MG TABS: 0.5 | 30 days supply | Qty: 30 | Fill #0

## 2016-08-16 MED FILL — ZOLPIDEM TARTRATE 10 MG TAB: 10 | 30 days supply | Qty: 30 | Fill #0

## 2016-08-19 MED FILL — FLUCONAZOLE 100 MG TABLET: 100 | 1 days supply | Qty: 2 | Fill #0

## 2016-08-22 MED FILL — NYSTATIN 100,000 UNIT/GM CR: 100000 | 8 days supply | Qty: 60 | Fill #0

## 2016-09-04 DIAGNOSIS — J069 Acute upper respiratory infection, unspecified: Secondary | ICD-10-CM | POA: Diagnosis not present

## 2016-09-20 MED FILL — ZOLPIDEM TARTRATE 10 MG TAB: 10 | 30 days supply | Qty: 30 | Fill #0

## 2016-09-26 MED FILL — hydrOXYzine HCL 25 MG TABS: 25 | 30 days supply | Qty: 90 | Fill #0

## 2016-10-01 DIAGNOSIS — B3749 Other urogenital candidiasis: Secondary | ICD-10-CM | POA: Diagnosis not present

## 2016-10-01 DIAGNOSIS — L309 Dermatitis, unspecified: Secondary | ICD-10-CM | POA: Diagnosis not present

## 2016-10-01 MED FILL — NYSTATIN 100,000 UNITS/GM O: 100000 | 3 days supply | Qty: 30 | Fill #0

## 2016-10-01 MED FILL — FLUCONAZOLE 100 MG TABLET: 100 | 7 days supply | Qty: 14 | Fill #0

## 2016-10-04 MED FILL — DOXYCYCLINE HYCLATE 100 MG: 100 | 10 days supply | Qty: 20 | Fill #0

## 2016-10-04 MED FILL — NYSTATIN 100,000 UNITS/GM O: 100000 | 3 days supply | Qty: 30 | Fill #1

## 2016-11-13 DIAGNOSIS — L239 Allergic contact dermatitis, unspecified cause: Secondary | ICD-10-CM | POA: Diagnosis not present

## 2016-11-13 DIAGNOSIS — L723 Sebaceous cyst: Secondary | ICD-10-CM | POA: Diagnosis not present

## 2016-11-13 DIAGNOSIS — L309 Dermatitis, unspecified: Secondary | ICD-10-CM | POA: Diagnosis not present

## 2016-11-13 DIAGNOSIS — L738 Other specified follicular disorders: Secondary | ICD-10-CM | POA: Diagnosis not present

## 2016-11-13 DIAGNOSIS — L304 Erythema intertrigo: Secondary | ICD-10-CM | POA: Diagnosis not present

## 2016-11-13 DIAGNOSIS — Z8619 Personal history of other infectious and parasitic diseases: Secondary | ICD-10-CM | POA: Diagnosis not present

## 2016-11-15 DIAGNOSIS — L239 Allergic contact dermatitis, unspecified cause: Secondary | ICD-10-CM | POA: Diagnosis not present

## 2016-11-18 DIAGNOSIS — L259 Unspecified contact dermatitis, unspecified cause: Secondary | ICD-10-CM | POA: Diagnosis not present

## 2016-12-11 MED FILL — hydrOXYzine HCL 25 MG TABS: 25 | 30 days supply | Qty: 90 | Fill #0

## 2016-12-26 DIAGNOSIS — L309 Dermatitis, unspecified: Secondary | ICD-10-CM | POA: Diagnosis not present

## 2016-12-26 DIAGNOSIS — L723 Sebaceous cyst: Secondary | ICD-10-CM | POA: Diagnosis not present

## 2016-12-26 DIAGNOSIS — L739 Follicular disorder, unspecified: Secondary | ICD-10-CM | POA: Diagnosis not present

## 2016-12-26 DIAGNOSIS — L7211 Pilar cyst: Secondary | ICD-10-CM | POA: Diagnosis not present

## 2017-01-07 DIAGNOSIS — L404 Guttate psoriasis: Secondary | ICD-10-CM | POA: Diagnosis not present

## 2017-01-07 DIAGNOSIS — L309 Dermatitis, unspecified: Secondary | ICD-10-CM | POA: Diagnosis not present

## 2017-01-09 DIAGNOSIS — Z125 Encounter for screening for malignant neoplasm of prostate: Secondary | ICD-10-CM | POA: Diagnosis not present

## 2017-01-15 DIAGNOSIS — R310 Gross hematuria: Secondary | ICD-10-CM | POA: Diagnosis not present

## 2017-01-28 DIAGNOSIS — D220 Melanocytic nevi of lip: Secondary | ICD-10-CM | POA: Diagnosis not present

## 2017-01-28 DIAGNOSIS — L821 Other seborrheic keratosis: Secondary | ICD-10-CM | POA: Diagnosis not present

## 2017-01-28 DIAGNOSIS — L239 Allergic contact dermatitis, unspecified cause: Secondary | ICD-10-CM | POA: Diagnosis not present

## 2017-03-17 DIAGNOSIS — L309 Dermatitis, unspecified: Secondary | ICD-10-CM | POA: Diagnosis not present

## 2017-03-17 DIAGNOSIS — L308 Other specified dermatitis: Secondary | ICD-10-CM | POA: Diagnosis not present

## 2017-03-21 DIAGNOSIS — L235 Allergic contact dermatitis due to other chemical products: Secondary | ICD-10-CM | POA: Diagnosis not present

## 2017-03-31 DIAGNOSIS — H04123 Dry eye syndrome of bilateral lacrimal glands: Secondary | ICD-10-CM | POA: Diagnosis not present

## 2017-04-22 DIAGNOSIS — Z0000 Encounter for general adult medical examination without abnormal findings: Secondary | ICD-10-CM | POA: Diagnosis not present

## 2017-04-23 DIAGNOSIS — R5383 Other fatigue: Secondary | ICD-10-CM | POA: Diagnosis not present

## 2017-06-17 ENCOUNTER — Emergency Department (HOSPITAL_COMMUNITY): Payer: BLUE CROSS/BLUE SHIELD

## 2017-06-17 ENCOUNTER — Encounter (HOSPITAL_COMMUNITY): Payer: Self-pay | Admitting: Emergency Medicine

## 2017-06-17 ENCOUNTER — Other Ambulatory Visit: Payer: Self-pay

## 2017-06-17 ENCOUNTER — Observation Stay (HOSPITAL_COMMUNITY)
Admission: EM | Admit: 2017-06-17 | Discharge: 2017-06-18 | Disposition: A | Discharge status: Home / Self Care | Payer: BLUE CROSS/BLUE SHIELD | Attending: Cardiovascular Disease | Admitting: Cardiovascular Disease

## 2017-06-17 DIAGNOSIS — K219 Gastro-esophageal reflux disease without esophagitis: Secondary | ICD-10-CM | POA: Diagnosis not present

## 2017-06-17 DIAGNOSIS — I472 Ventricular tachycardia: Secondary | ICD-10-CM | POA: Diagnosis not present

## 2017-06-17 DIAGNOSIS — R072 Precordial pain: Principal | ICD-10-CM | POA: Diagnosis present

## 2017-06-17 DIAGNOSIS — R002 Palpitations: Secondary | ICD-10-CM | POA: Diagnosis not present

## 2017-06-17 DIAGNOSIS — I493 Ventricular premature depolarization: Secondary | ICD-10-CM | POA: Diagnosis not present

## 2017-06-17 DIAGNOSIS — Z888 Allergy status to other drugs, medicaments and biological substances status: Secondary | ICD-10-CM | POA: Diagnosis not present

## 2017-06-17 DIAGNOSIS — R55 Syncope and collapse: Secondary | ICD-10-CM | POA: Diagnosis not present

## 2017-06-17 DIAGNOSIS — K210 Gastro-esophageal reflux disease with esophagitis: Secondary | ICD-10-CM | POA: Diagnosis not present

## 2017-06-17 DIAGNOSIS — Z881 Allergy status to other antibiotic agents status: Secondary | ICD-10-CM | POA: Diagnosis not present

## 2017-06-17 DIAGNOSIS — E785 Hyperlipidemia, unspecified: Secondary | ICD-10-CM | POA: Diagnosis not present

## 2017-06-17 DIAGNOSIS — R011 Cardiac murmur, unspecified: Secondary | ICD-10-CM

## 2017-06-17 DIAGNOSIS — R42 Dizziness and giddiness: Secondary | ICD-10-CM | POA: Diagnosis not present

## 2017-06-17 DIAGNOSIS — K589 Irritable bowel syndrome without diarrhea: Secondary | ICD-10-CM | POA: Insufficient documentation

## 2017-06-17 DIAGNOSIS — R079 Chest pain, unspecified: Secondary | ICD-10-CM | POA: Diagnosis not present

## 2017-06-17 LAB — BASIC METABOLIC PANEL
Anion gap: 8 (ref 5–15)
BUN: 23 mg/dL — AB (ref 6–20)
CHLORIDE: 105 mmol/L (ref 101–111)
CO2: 27 mmol/L (ref 22–32)
CREATININE: 1.19 mg/dL (ref 0.61–1.24)
Calcium: 8.9 mg/dL (ref 8.9–10.3)
GFR calc Af Amer: >60 mL/min (ref >60)
GFR calc non Af Amer: >60 mL/min (ref >60)
GLUCOSE: 125 mg/dL — AB (ref 65–99)
POTASSIUM: 4.1 mmol/L (ref 3.5–5.1)
SODIUM: 140 mmol/L (ref 135–145)

## 2017-06-17 LAB — POCT I-STAT TROPONIN I: Troponin i, poc: 0.01 ng/mL (ref 0.00–0.08)

## 2017-06-17 LAB — CBC
HEMATOCRIT: 40.9 % (ref 39.0–52.0)
Hemoglobin: 14.1 g/dL (ref 13.0–17.0)
MCH: 30.4 pg (ref 26.0–34.0)
MCHC: 34.5 g/dL (ref 30.0–36.0)
MCV: 88.1 fL (ref 78.0–100.0)
PLATELETS: 240 10*3/uL (ref 150–400)
RBC: 4.64 MIL/uL (ref 4.22–5.81)
RDW: 12.5 % (ref 11.5–15.5)
WBC: 9.5 10*3/uL (ref 4.0–10.5)

## 2017-06-17 IMAGING — CR DG CHEST 2V
2 series · 2 of 2 positions shown · non-contrast
Comparison: Chest CT [DATE]

CLINICAL DATA: Chest pain

EXAM:
CHEST  2 VIEW

[w chest pa]
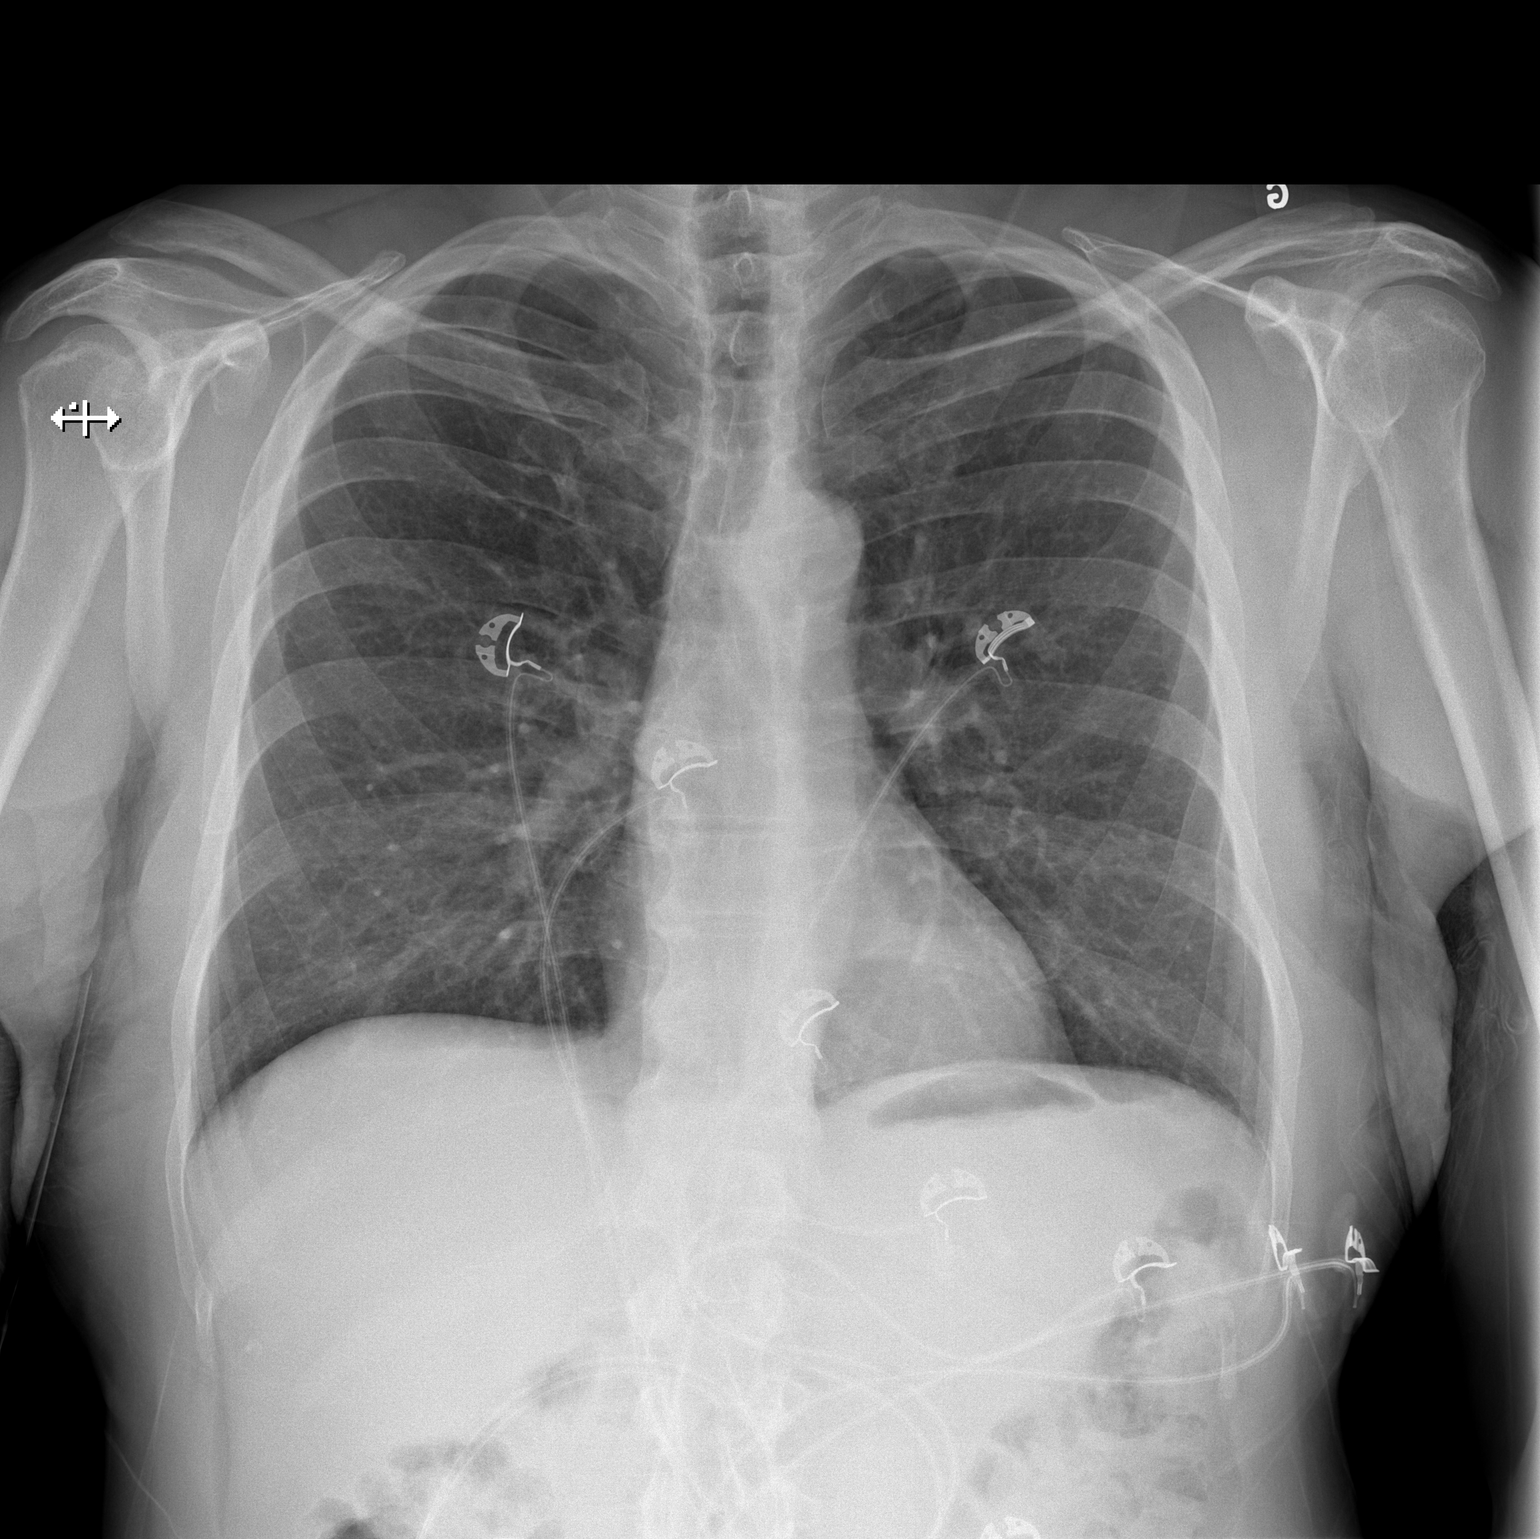

[w chest lat]
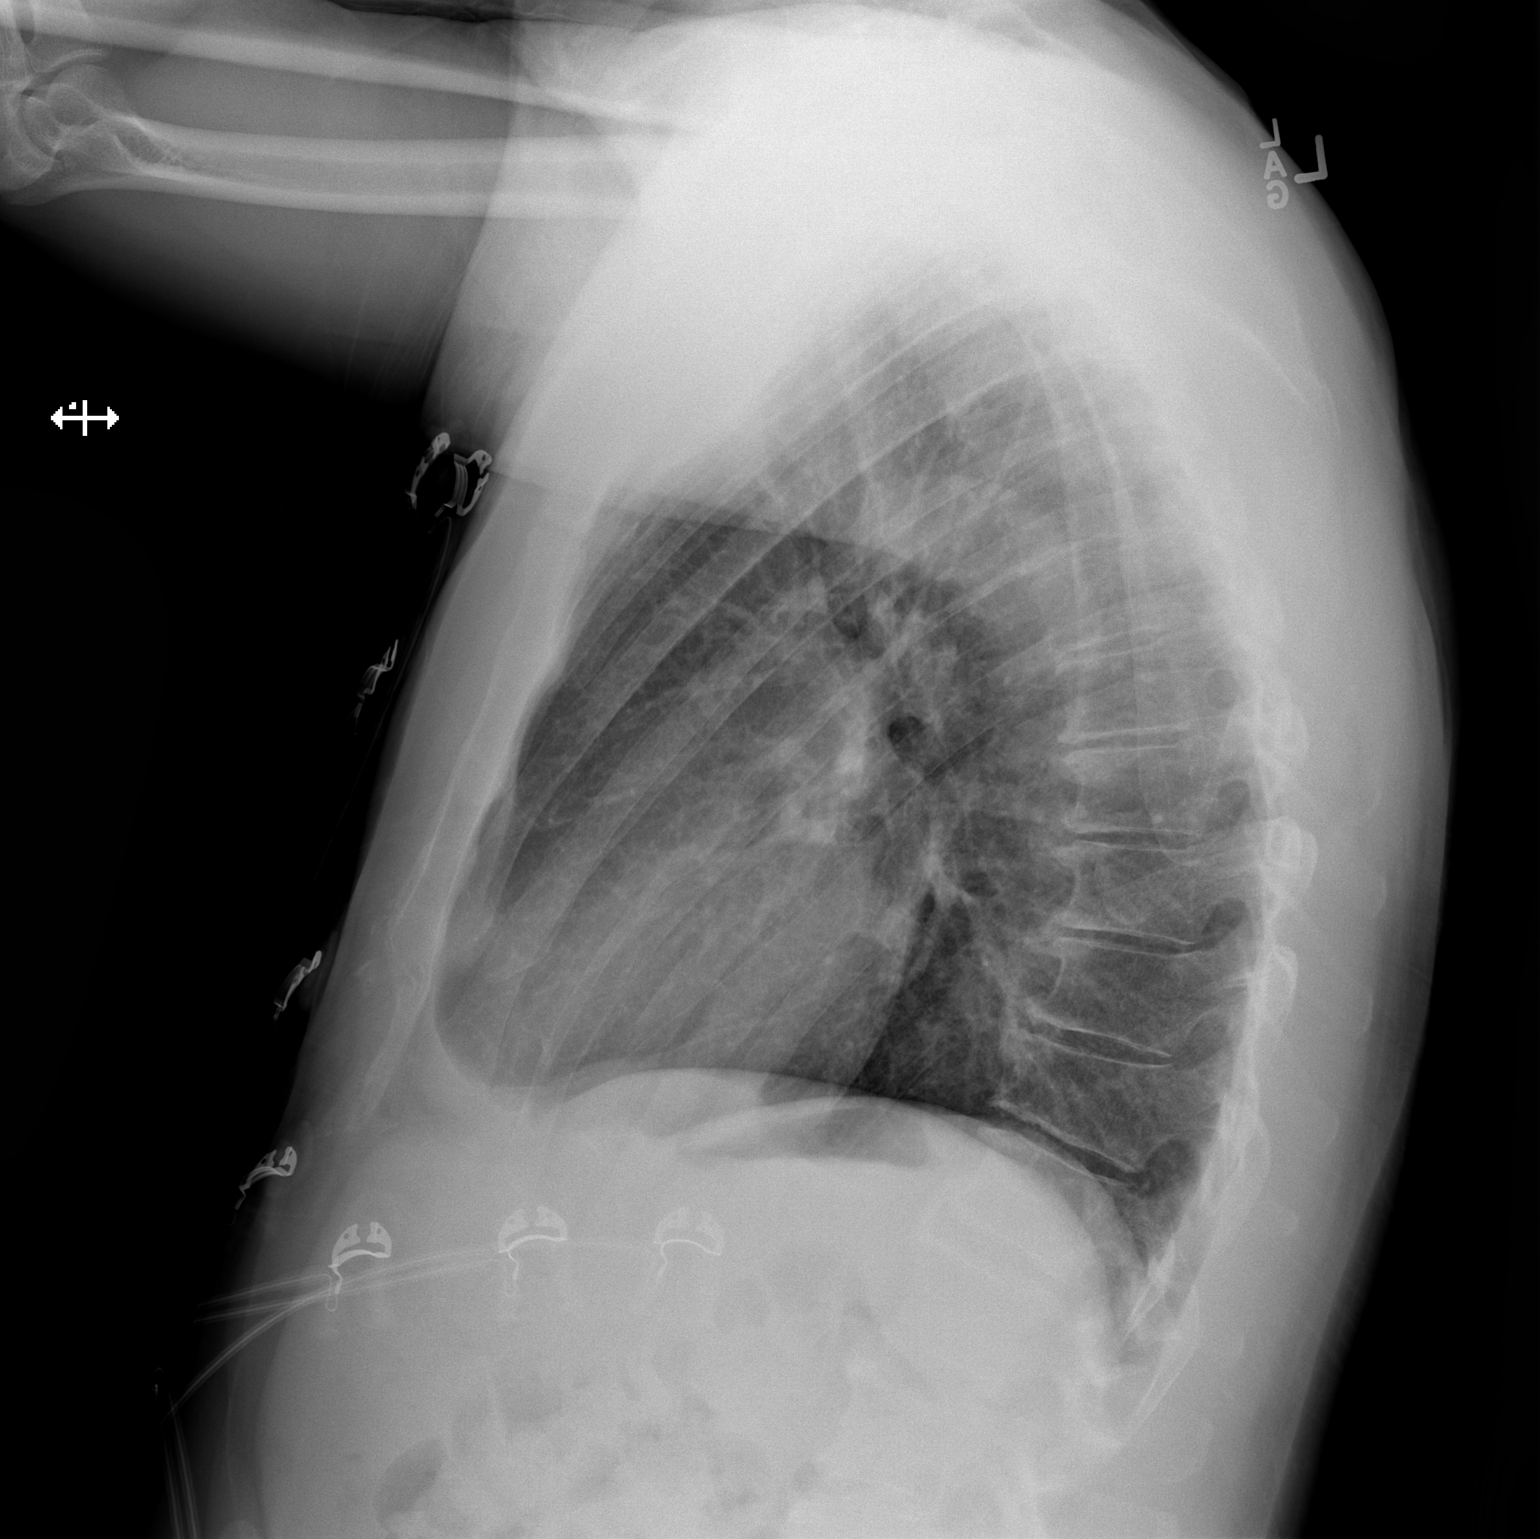

[2 of 2 positions shown; findings below may reference images not displayed]

FINDINGS: The heart size and mediastinal contours are within normal limits.
Both lungs are clear. The visualized skeletal structures are
unremarkable. Known pulmonary nodules seen on prior CT are not
visible on this study.
IMPRESSION: No active cardiopulmonary disease.

## 2017-06-17 MED ORDER — ASPIRIN 81 MG PO CHEW
324.0000 mg | CHEWABLE_TABLET | Freq: Once | ORAL | Status: AC
  Administered 2017-06-17: 324 mg via ORAL
  Filled 2017-06-17: qty 4

## 2017-06-17 NOTE — ED Provider Notes (Signed)
[facility] Provider Note   CSN: [id number] Arrival date & time: 06/17/17  2103     History   Chief Complaint Chief Complaint  Patient presents with  . Chest Pain    HPI Eric Rhodes is a 61 y.o. male.  HPI Patient is very active at baseline. He wrote a 40 mile bicycle ride today.He felt well. In the evening he developed racing and skipping of his heart. He felt weak and near syncopal. He got diaphoretic. No history of similar episodes. No history of cardiac dysfunction. Is been well recently. Past Medical History:  Diagnosis Date  . GERD (gastroesophageal reflux disease)    diet controlled  . History of colonoscopy 2004  . History of endoscopy 2004  . Irritable bowel disease     Patient Active Problem List   Diagnosis Date Noted  . Precordial chest pain 06/18/2017  . Inguinal hernia, left 09/27/2011    Past Surgical History:  Procedure Laterality Date  . ANTERIOR CRUCIATE LIGAMENT REPAIR  1978   lt  . COLONOSCOPY    . HERNIA REPAIR    . INGUINAL HERNIA REPAIR  11/07/2011   Procedure: HERNIA REPAIR INGUINAL ADULT;  Surgeon: Shann Medal, MD;  Location: [facility];  Service: General;  Laterality: Left;  Left Inguinal Herniorrhaphy with Mesh  . UPPER GASTROINTESTINAL ENDOSCOPY    . WISDOM TOOTH EXTRACTION         Home Medications    Prior to Admission medications   Medication Sig Start Date End Date Taking? Authorizing Provider  Amino Acids (L-CARNITINE PO) Take 1 tablet by mouth daily.   Yes [provider]  aspirin 81 MG EC tablet Take 81 mg by mouth daily. Swallow whole.   Yes [provider]  cholecalciferol (VITAMIN D) 1000 units tablet Take 1,000 Units by mouth daily.   Yes [provider]  co-enzyme Q-10 30 MG capsule Take 30 mg by mouth daily.   Yes [provider]  fluticasone (FLONASE) 50 MCG/ACT nasal spray Place 2 sprays into both nostrils daily.    Yes [provider]  Omega-3 Fatty Acids (FISH OIL PO) Take 1 capsule by mouth 2 (two) times daily.   Yes [provider]  therapeutic multivitamin-minerals (THERAGRAN-M) tablet Take 1 tablet by mouth daily.   Yes [provider]  vitamin B-12 (CYANOCOBALAMIN) 100 MCG tablet Take 100 mcg by mouth daily.   Yes [provider]  zolpidem (AMBIEN) 5 MG tablet Take 5 mg by mouth at bedtime as needed for sleep.   Yes [provider]    Family History Family History  Problem Relation Age of Onset  . Heart disease Mother        Bradycardia/pacemaker/clotting disorder  . COPD Father   . Diabetes Brother     Social History Social History  Substance Use Topics  . Smoking status: Never Smoker  . Smokeless tobacco: Never Used  . Alcohol use No     Allergies   Augmentin [amoxicillin-pot clavulanate] and Betadine [povidone iodine]   Review of Systems Review of Systems 10 Systems reviewed and are negative for acute change except as noted in the HPI.   Physical Exam Updated Vital Signs BP 120/62 (BP Location: Right Arm)   Pulse (!) 54   Temp 98.7 F (37.1 C) (Oral)   Resp 16   Ht 5' 3.5" (1.613 m)   Wt 69.1 kg (152 lb 5.4 oz)   SpO2 96%  BMI 26.56 kg/m   Physical Exam  Constitutional: He is oriented to person, place, and time. He appears well-developed and well-nourished.  HENT:  Head: Normocephalic and atraumatic.  Nose: Nose normal.  Mouth/Throat: Oropharynx is clear and moist.  Eyes: Conjunctivae and EOM are normal.  Neck: Neck supple.  Cardiovascular: Normal rate and regular rhythm.   Murmur heard. Patient's heart is regular. He goes multiple beats with no appreciable murmur but intermittently there is a harsh systolic murmur for 1 or 2 beats. I appreciate his best over the mitral area.Pulses are intact.Patient is not symptomatic with this.  Pulmonary/Chest: Effort normal and breath sounds normal. No respiratory distress.    Abdominal: Soft. There is no tenderness.  Musculoskeletal: He exhibits no edema or tenderness.  Neurological: He is alert and oriented to person, place, and time. No cranial nerve deficit. He exhibits normal muscle tone. Coordination normal.  Skin: Skin is warm and dry.  Psychiatric: He has a normal mood and affect.  Nursing note and vitals reviewed.    ED Treatments / Results  Labs (all labs ordered are listed, but only abnormal results are displayed) Labs Reviewed  BASIC METABOLIC PANEL - Abnormal; Notable for the following:       Result Value   Glucose, Bld 125 (*)    BUN 23 (*)    All other components within normal limits  LIPID PANEL - Abnormal; Notable for the following:    LDL Cholesterol 114 (*)    All other components within normal limits  BASIC METABOLIC PANEL - Abnormal; Notable for the following:    BUN 22 (*)    Calcium 8.8 (*)    All other components within normal limits  HEPARIN LEVEL (UNFRACTIONATED) - Abnormal; Notable for the following:    Heparin Unfractionated 0.22 (*)    All other components within normal limits  HEPARIN LEVEL (UNFRACTIONATED) - Abnormal; Notable for the following:    Heparin Unfractionated <0.10 (*)    All other components within normal limits  CBC  HIV ANTIBODY (ROUTINE TESTING)  TROPONIN I  TROPONIN I  TROPONIN I  TSH  CBC  APTT  PROTIME-INR  HEMOGLOBIN A1C  CBC  I-STAT TROPONIN, ED  POCT I-STAT TROPONIN I    EKG  EKG Interpretation  Date/Time:  Tuesday June 17 2017 21:15:01 EDT Ventricular Rate:  79 PR Interval:    QRS Duration: 100 QT Interval:  387 QTC Calculation: 444 R Axis:   90 Text Interpretation:  Sinus rhythm Borderline right axis deviation Premature ventricular complexes Non-specific ST-t changes Confirmed by Virgel Manifold [phone]) on 06/18/2017 9:14:37 AM       Radiology Dg Chest 2 View  Result Date: 06/17/2017 CLINICAL DATA:  Chest pain EXAM: CHEST  2 VIEW COMPARISON:  Chest CT 07/02/2016  FINDINGS: The heart size and mediastinal contours are within normal limits. Both lungs are clear. The visualized skeletal structures are unremarkable. Known pulmonary nodules seen on prior CT are not visible on this study. IMPRESSION: No active cardiopulmonary disease. Electronically Signed   By: Ulyses Jarred M.D.   On: 06/17/2017 21:27   Nm Myocar Multi W/spect W/wall Motion / Ef  Result Date: 06/18/2017 CLINICAL DATA:  Chest pain, possible ACS, negative troponin EXAM: MYOCARDIAL IMAGING WITH SPECT (REST AND PHARMACOLOGIC-STRESS) GATED LEFT VENTRICULAR WALL MOTION STUDY LEFT VENTRICULAR EJECTION FRACTION TECHNIQUE: Standard myocardial SPECT imaging was performed after resting intravenous injection of 10 mCi Tc-46m tetrofosmin. Subsequently, intravenous infusion of Lexiscan was performed under the supervision of the Cardiology  staff. At peak effect of the drug, 30 mCi Tc-36m tetrofosmin was injected intravenously and standard myocardial SPECT imaging was performed. Quantitative gated imaging was also performed to evaluate left ventricular wall motion, and estimate left ventricular ejection fraction. COMPARISON:  None. FINDINGS: Perfusion: No decreased activity in the left ventricle on stress imaging to suggest reversible ischemia or infarction. Wall Motion: Normal left ventricular wall motion. No left ventricular dilation. Left Ventricular Ejection Fraction: 61 % End diastolic volume 98 ml End systolic volume 39 ml IMPRESSION: 1. No reversible ischemia or infarction. 2. Normal left ventricular wall motion. 3. Left ventricular ejection fraction 61% 4. Non invasive risk stratification*: Low *2012 Appropriate Use Criteria for Coronary Revascularization Focused Update: J Am Coll Cardiol. 1914;78(2):956-213. http://content.airportbarriers.com.aspx?articleid=1201161 Electronically Signed   By: Julian Hy M.D.   On: 06/18/2017 15:10    Procedures Procedures (including critical care time)  Medications  Ordered in ED Medications  aspirin EC tablet 81 mg (not administered)  nitroGLYCERIN (NITROSTAT) SL tablet 0.4 mg (not administered)  acetaminophen (TYLENOL) tablet 650 mg (not administered)  ondansetron (ZOFRAN) injection 4 mg (not administered)  atorvastatin (LIPITOR) tablet 80 mg (80 mg Oral Given 06/18/17 1736)  metoprolol tartrate (LOPRESSOR) tablet 12.5 mg (12.5 mg Oral Given 06/18/17 1018)  heparin ADULT infusion 100 units/mL (25000 units/232mL sodium chloride 0.45%) (850 Units/hr Intravenous Rate/Dose Change 06/18/17 1703)  zolpidem (AMBIEN) tablet 5 mg (5 mg Oral Given 06/18/17 0118)  Influenza vac split quadrivalent PF (FLUARIX) injection 0.5 mL (not administered)  heparin bolus via infusion 900 Units (900 Units Intravenous Not Given 06/18/17 1000)  aspirin chewable tablet 324 mg (324 mg Oral Given 06/17/17 2339)  heparin bolus via infusion 3,500 Units (3,500 Units Intravenous Bolus from Bag 06/18/17 0103)     Initial Impression / Assessment and Plan / ED Course  I have reviewed the triage vital signs and the nursing notes.  Pertinent labs & imaging results that were available during my care of the patient were reviewed by me and considered in my medical decision making (see chart for details).     Consult: Dr. Lilian Coma document has seen the patient emergency department will admit.  Final Clinical Impressions(s) / ED Diagnoses   Final diagnoses:  Palpitations  Near syncope  Heart murmur  PVC (premature ventricular contraction)    New Prescriptions Current Discharge Medication List       Charlesetta Shanks, MD 06/18/17 1740

## 2017-06-17 NOTE — ED Triage Notes (Signed)
Patient is complaining of left chest pain. Patient states he feels like his heart is skipping a beat. Patient states it started 45 min ago.

## 2017-06-18 ENCOUNTER — Observation Stay (HOSPITAL_COMMUNITY): Payer: BLUE CROSS/BLUE SHIELD

## 2017-06-18 ENCOUNTER — Ambulatory Visit (HOSPITAL_COMMUNITY)
Admit: 2017-06-18 | Discharge: 2017-06-18 | Disposition: A | Discharge status: Home / Self Care | Payer: BLUE CROSS/BLUE SHIELD | Source: Ambulatory Visit | Attending: Cardiovascular Disease | Admitting: Cardiovascular Disease

## 2017-06-18 DIAGNOSIS — R079 Chest pain, unspecified: Secondary | ICD-10-CM

## 2017-06-18 DIAGNOSIS — R072 Precordial pain: Secondary | ICD-10-CM | POA: Diagnosis present

## 2017-06-18 DIAGNOSIS — I472 Ventricular tachycardia: Secondary | ICD-10-CM | POA: Diagnosis not present

## 2017-06-18 DIAGNOSIS — R42 Dizziness and giddiness: Secondary | ICD-10-CM | POA: Diagnosis not present

## 2017-06-18 DIAGNOSIS — K210 Gastro-esophageal reflux disease with esophagitis: Secondary | ICD-10-CM | POA: Diagnosis not present

## 2017-06-18 LAB — LIPID PANEL
Cholesterol: 176 mg/dL (ref 0–200)
HDL: 56 mg/dL (ref >40)
LDL CALC: 114 mg/dL — AB (ref 0–99)
TRIGLYCERIDES: 32 mg/dL (ref <150)
Total CHOL/HDL Ratio: 3.1 RATIO
VLDL: 6 mg/dL (ref 0–40)

## 2017-06-18 LAB — BASIC METABOLIC PANEL
ANION GAP: 8 (ref 5–15)
BUN: 22 mg/dL — ABNORMAL HIGH (ref 6–20)
CO2: 30 mmol/L (ref 22–32)
Calcium: 8.8 mg/dL — ABNORMAL LOW (ref 8.9–10.3)
Chloride: 104 mmol/L (ref 101–111)
Creatinine, Ser: 1.12 mg/dL (ref 0.61–1.24)
GFR calc Af Amer: >60 mL/min (ref >60)
Glucose, Bld: 97 mg/dL (ref 65–99)
POTASSIUM: 3.8 mmol/L (ref 3.5–5.1)
SODIUM: 142 mmol/L (ref 135–145)

## 2017-06-18 LAB — HEMOGLOBIN A1C
Hgb A1c MFr Bld: 5.4 % (ref 4.8–5.6)
MEAN PLASMA GLUCOSE: 108.28 mg/dL

## 2017-06-18 LAB — TROPONIN I
Troponin I: <0.03 ng/mL (ref <0.03)
Troponin I: <0.03 ng/mL (ref <0.03)

## 2017-06-18 LAB — CBC
HEMATOCRIT: 41.9 % (ref 39.0–52.0)
HEMOGLOBIN: 14 g/dL (ref 13.0–17.0)
MCH: 29.7 pg (ref 26.0–34.0)
MCHC: 33.4 g/dL (ref 30.0–36.0)
MCV: 89 fL (ref 78.0–100.0)
Platelets: 221 10*3/uL (ref 150–400)
RBC: 4.71 MIL/uL (ref 4.22–5.81)
RDW: 12.6 % (ref 11.5–15.5)
WBC: 7.8 10*3/uL (ref 4.0–10.5)

## 2017-06-18 LAB — PROTIME-INR
INR: 1.15
PROTHROMBIN TIME: 14.6 s (ref 11.4–15.2)

## 2017-06-18 LAB — APTT: aPTT: 33 seconds (ref 24–36)

## 2017-06-18 LAB — HEPARIN LEVEL (UNFRACTIONATED)
HEPARIN UNFRACTIONATED: 0.22 [IU]/mL — AB (ref 0.30–0.70)
Heparin Unfractionated: <0.1 IU/mL — ABNORMAL LOW (ref 0.30–0.70)

## 2017-06-18 LAB — TSH: TSH: 3.164 u[IU]/mL (ref 0.350–4.500)

## 2017-06-18 LAB — HIV ANTIBODY (ROUTINE TESTING W REFLEX): HIV Screen 4th Generation wRfx: NONREACTIVE

## 2017-06-18 MED ORDER — TECHNETIUM TC 99M TETROFOSMIN IV KIT
10.0000 | PACK | Freq: Once | INTRAVENOUS | Status: AC | PRN
  Administered 2017-06-18: 10 via INTRAVENOUS

## 2017-06-18 MED ORDER — ATORVASTATIN CALCIUM 40 MG PO TABS: 40.0000 mg | ORAL_TABLET | Freq: Every day | ORAL | 3 refills | Status: DC

## 2017-06-18 MED ORDER — REGADENOSON 0.4 MG/5ML IV SOLN
INTRAVENOUS | Status: AC
  Administered 2017-06-18: 0.4 mg via INTRAVENOUS
  Filled 2017-06-18: qty 5

## 2017-06-18 MED ORDER — ACETAMINOPHEN 325 MG PO TABS: 650.0000 mg | ORAL_TABLET | ORAL | Status: DC | PRN

## 2017-06-18 MED ORDER — ONDANSETRON HCL 4 MG/2ML IJ SOLN: 4.0000 mg | Freq: Four times a day (QID) | INTRAMUSCULAR | Status: DC | PRN

## 2017-06-18 MED ORDER — ASPIRIN EC 81 MG PO TBEC: 81.0000 mg | DELAYED_RELEASE_TABLET | Freq: Every day | ORAL | Status: DC

## 2017-06-18 MED ORDER — INFLUENZA VAC SPLIT QUAD 0.5 ML IM SUSY
0.5000 mL | PREFILLED_SYRINGE | INTRAMUSCULAR | Status: DC
Start: 2017-06-19 — End: 2017-06-18

## 2017-06-18 MED ORDER — ZOLPIDEM TARTRATE 5 MG PO TABS
5.0000 mg | ORAL_TABLET | Freq: Every day | ORAL | Status: DC
  Administered 2017-06-18: 5 mg via ORAL
  Filled 2017-06-18: qty 1

## 2017-06-18 MED ORDER — PANTOPRAZOLE SODIUM 40 MG PO TBEC: 40.0000 mg | DELAYED_RELEASE_TABLET | Freq: Every day | ORAL | 3 refills | Status: DC

## 2017-06-18 MED ORDER — NITROGLYCERIN 0.4 MG SL SUBL: 0.4000 mg | SUBLINGUAL_TABLET | SUBLINGUAL | 1 refills | Status: DC | PRN

## 2017-06-18 MED ORDER — REGADENOSON 0.4 MG/5ML IV SOLN
0.4000 mg | Freq: Once | INTRAVENOUS | Status: AC
  Administered 2017-06-18: 0.4 mg via INTRAVENOUS

## 2017-06-18 MED ORDER — PANTOPRAZOLE SODIUM 40 MG PO TBEC
40.0000 mg | DELAYED_RELEASE_TABLET | Freq: Every day | ORAL | Status: DC
  Administered 2017-06-18: 40 mg via ORAL
  Filled 2017-06-18: qty 1

## 2017-06-18 MED ORDER — HEPARIN BOLUS VIA INFUSION
900.0000 [IU] | Freq: Once | INTRAVENOUS | Status: DC
  Filled 2017-06-18: qty 900

## 2017-06-18 MED ORDER — ATORVASTATIN CALCIUM 40 MG PO TABS
80.0000 mg | ORAL_TABLET | Freq: Every day | ORAL | Status: DC
  Administered 2017-06-18: 80 mg via ORAL
  Filled 2017-06-18: qty 2

## 2017-06-18 MED ORDER — NITROGLYCERIN 0.4 MG SL SUBL: 0.4000 mg | SUBLINGUAL_TABLET | SUBLINGUAL | Status: DC | PRN

## 2017-06-18 MED ORDER — HEPARIN BOLUS VIA INFUSION
3500.0000 [IU] | Freq: Once | INTRAVENOUS | Status: AC
  Administered 2017-06-18: 3500 [IU] via INTRAVENOUS
  Filled 2017-06-18: qty 3500

## 2017-06-18 MED ORDER — HEPARIN (PORCINE) IN NACL 100-0.45 UNIT/ML-% IJ SOLN
850.0000 [IU]/h | INTRAMUSCULAR | Status: DC
  Administered 2017-06-18: 750 [IU]/h via INTRAVENOUS
  Filled 2017-06-18: qty 250

## 2017-06-18 MED ORDER — METOPROLOL TARTRATE 25 MG PO TABS
12.5000 mg | ORAL_TABLET | Freq: Two times a day (BID) | ORAL | Status: DC
  Administered 2017-06-18 (×2): 12.5 mg via ORAL
  Filled 2017-06-18 (×2): qty 1

## 2017-06-18 NOTE — Progress Notes (Signed)
ANTICOAGULATION CONSULT NOTE - Initial Consult  Pharmacy Consult for IV heparin Indication: chest pain/ACS  Allergies  Allergen Reactions  . Augmentin [Amoxicillin-Pot Clavulanate] Other (See Comments)    Causes GI cramps  . Betadine [Povidone Iodine]     Skin sensitive    Patient Measurements: Height: 5' 3.5" (161.3 cm) Weight: 150 lb (68 kg) IBW/kg (Calculated) : 58.05 Heparin Dosing Weight: 61  Vital Signs: Temp: 98 F (36.7 C) (10/16 2357) Temp Source: Oral (10/16 2357) BP: 130/69 (10/16 2357) Pulse Rate: 64 (10/16 2357)  Labs:  Recent Labs  06/17/17 2143  HGB 14.1  HCT 40.9  PLT 240  CREATININE 1.19    Estimated Creatinine Clearance: 52.9 mL/min (by C-G formula based on SCr of 1.19 mg/dL).   Medical History: Past Medical History:  Diagnosis Date  . GERD (gastroesophageal reflux disease)    diet controlled  . History of colonoscopy 2004  . History of endoscopy 2004  . Irritable bowel disease     Medications:  Scheduled:  . [START ON 06/19/2017] aspirin EC  81 mg Oral Daily  . atorvastatin  80 mg Oral q1800  . heparin  3,500 Units Intravenous Once  . metoprolol tartrate  12.5 mg Oral BID   Infusions:  . heparin      Assessment: 44 yoM with CP. IV heparin for ACS.  Goal of Therapy:  Heparin level 0.3-0.7 units/ml Monitor platelets by anticoagulation protocol: Yes   Plan:  Baseline aptt and pt/inr STAT Heparin 3500 unit bolus x1 Start drip at 750 units/hr Daily CBC/HL Check 1st HL in 6 hours  Lawana Pai R 06/18/2017,12:37 AM

## 2017-06-18 NOTE — Discharge Summary (Signed)
Physician Discharge Summary  Patient ID: Eric Rhodes MRN: [id number] DOB/AGE: [birth year] 62 y.o.  Admit date: 06/17/2017 Discharge date: 06/18/2017  Admission Diagnoses: Chest pain r/o MI GERD Non-sustained VT Dizziness  Discharge Diagnoses:  Principle Problem: *Precordial chest pain* Active Problems:   Hyperlipidemia   GERD   Non-sustained VT   Irritable bowel disease  Discharged Condition: fair  Hospital Course: 61 year old male with PMH of GERD and Irritable bowel disease had precordial chest pain without fever, cough or shortness of breath. His troponin I level was normal x 3. His nuclear stress test was without reversible ischemia and showed 61 % EF.  His lipid profile was near normal with Mildly elevated LDL cholesterol. He was started on moderate dose Atorvastatin and Protonix for GERD. He will see me in 1 week and see primary care in 1 month. Beta-blocker was not given due to baseline bradycardia.  Consults: cardiology  Significant Diagnostic Studies: labs: Normal CBC and near normal BMET and Lipid profile except LDL of 114 mg. Troponin-I was normal.  EKG: NSR + frequent VPCs and 3 beat run of VT.  Chest x-ray: Unremarkable.  Nuclear stress test: No reversible ischemia. EF 61 %.  Treatments: cardiac meds: Aspirin and Atorvastatin.  Discharge Exam: Blood pressure 120/62, pulse (!) 54, temperature 98.7 F (37.1 C), temperature source Oral, resp. rate 16, height 5' 3.5" (1.613 m), weight 69.1 kg (152 lb 5.4 oz), SpO2 96 %. General appearance: alert, cooperative and appears stated age. Head: Normocephalic, atraumatic. Eyes: Light Brown eyes, pink conjunctiva, corneas clear. PERRL, EOM's intact.  Neck: No adenopathy, no carotid bruit, no JVD, supple, symmetrical, trachea midline and thyroid not enlarged. Resp: Clear to auscultation bilaterally. Cardio: Regular rate and rhythm, S1, S2 normal, II/VI systolic murmur, no click, rub or gallop. GI: Soft,  non-tender; bowel sounds normal; no organomegaly. Extremities: No edema, cyanosis or clubbing. Skin: Warm and dry.  Neurologic: Alert and oriented X 3, normal strength and tone. Normal coordination and gait.  Disposition: 01-Home or Self Care   Allergies as of 06/18/2017      Reactions   Augmentin [amoxicillin-pot Clavulanate] Other (See Comments)   Causes GI cramps   Betadine [povidone Iodine]    Skin sensitive      Medication List    TAKE these medications   aspirin 81 MG EC tablet Take 81 mg by mouth daily. Swallow whole.   atorvastatin 40 MG tablet Commonly known as:  LIPITOR Take 1 tablet (40 mg total) by mouth daily.   cholecalciferol 1000 units tablet Commonly known as:  VITAMIN D Take 1,000 Units by mouth daily.   co-enzyme Q-10 30 MG capsule Take 30 mg by mouth daily.   FISH OIL PO Take 1 capsule by mouth 2 (two) times daily.   fluticasone 50 MCG/ACT nasal spray Commonly known as:  FLONASE Place 2 sprays into both nostrils daily.   L-CARNITINE PO Take 1 tablet by mouth daily.   nitroGLYCERIN 0.4 MG SL tablet Commonly known as:  NITROSTAT Place 1 tablet (0.4 mg total) under the tongue every 5 (five) minutes x 3 doses as needed for chest pain.   pantoprazole 40 MG tablet Commonly known as:  PROTONIX Take 1 tablet (40 mg total) by mouth daily.   therapeutic multivitamin-minerals tablet Take 1 tablet by mouth daily.   vitamin B-12 100 MCG tablet Commonly known as:  CYANOCOBALAMIN Take 100 mcg by mouth daily.   zolpidem 5 MG tablet Commonly known as:  AMBIEN Take 5  mg by mouth at bedtime as needed for sleep.      Follow-up Information    Kathyrn Lass, MD. Call in 1 month(s).   Specialty:  Family Medicine Contact information: [place] [address] [phone]        Dixie Dials, MD. Schedule an appointment as soon as possible for a visit in 1 week(s).   Specialty:  Cardiology Contact information: [place] [address] [phone]           Signed: Birdie Riddle 06/18/2017, 5:58 PM

## 2017-06-18 NOTE — Progress Notes (Signed)
ANTICOAGULATION CONSULT NOTE - Follow Up Consult  Pharmacy Consult for Heparin Indication: chest pain/ACS  Allergies  Allergen Reactions  . Augmentin [Amoxicillin-Pot Clavulanate] Other (See Comments)    Causes GI cramps  . Betadine [Povidone Iodine]     Skin sensitive    Patient Measurements: Height: 5' 3.5" (161.3 cm) Weight: 152 lb 5.4 oz (69.1 kg) IBW/kg (Calculated) : 58.05 Heparin Dosing Weight: actual weight  Vital Signs: Temp: 98 F (36.7 C) (10/17 0710) Temp Source: Oral (10/17 0710) BP: 110/59 (10/17 1320) Pulse Rate: 69 (10/17 1320)  Labs:  Recent Labs  06/17/17 2143 06/18/17 0028 06/18/17 0103 06/18/17 0459 06/18/17 0833  HGB 14.1  --   --  14.0  --   HCT 40.9  --   --  41.9  --   PLT 240  --   --  221  --   APTT  --   --  33  --   --   LABPROT  --   --  14.6  --   --   INR  --   --  1.15  --   --   HEPARINUNFRC  --   --   --   --  0.22*  CREATININE 1.19  --   --  1.12  --   TROPONINI  --  <0.03  --  <0.03  --     Estimated Creatinine Clearance: 56.2 mL/min (by C-G formula based on SCr of 1.12 mg/dL).   Medications:  Infusions:  . heparin 750 Units/hr (06/18/17 0105)    Assessment: 11 yoM admitted on 10/17 with chest pain, dizziness, EKG shows PVC's and 3 beat VT.  Pharmacy is consulted to dose Heparin for r/o ACS.   Heparin level <0.1 RN reports pt was off the unit for most of the day and suspects that heparin was off most of that time.  The pt returned from [facility] around 1700 and heparin was restarted at that time.    Goal of Therapy:  Heparin level 0.3-0.7 units/ml Monitor platelets by anticoagulation protocol: Yes   Plan:   Continue heparin IV infusion at 850 units/hr  Heparin level in 6 hours  Daily heparin level and CBC   Gretta Arab PharmD, BCPS Pager [phone] 06/18/2017 4:05 PM

## 2017-06-18 NOTE — H&P (Signed)
Referring Physician:  [name] is an 61 y.o. male.                       Chief Complaint: Chest pain  HPI: 61 year old male with PMH of GERD and irritable bowel disease had left precordial, non-radiating, dull chest pain with skipping beats and feeling dizzy. Patient has lost 30 pounds of weight over 5 years doing exercise and diet. Patient is avid bicycler 3 days a week and does weight lifting at a gym 4 days a week. No fever or cough. EKG shows sinus rhythm with frequent VPCs and 3 beat VT.  Past Medical History:  Diagnosis Date  . GERD (gastroesophageal reflux disease)    diet controlled  . History of colonoscopy 2004  . History of endoscopy 2004  . Irritable bowel disease       Past Surgical History:  Procedure Laterality Date  . ANTERIOR CRUCIATE LIGAMENT REPAIR  1978   lt  . COLONOSCOPY    . HERNIA REPAIR    . INGUINAL HERNIA REPAIR  11/07/2011   Procedure: HERNIA REPAIR INGUINAL ADULT;  Surgeon: Eric Medal, MD;  Location: [facility];  Service: General;  Laterality: Left;  Left Inguinal Herniorrhaphy with Mesh  . UPPER GASTROINTESTINAL ENDOSCOPY    . WISDOM TOOTH EXTRACTION      Family History  Problem Relation Age of Onset  . Heart disease Mother        Bradycardia/pacemaker/clotting disorder  . COPD Father   . Diabetes Brother    Social History:  reports that he has never smoked. He has never used smokeless tobacco. He reports that he does not drink alcohol or use drugs.  Allergies:  Allergies  Allergen Reactions  . Augmentin [Amoxicillin-Pot Clavulanate] Other (See Comments)    Causes GI cramps  . Betadine [Povidone Iodine]     Skin sensitive     (Not in a hospital admission)  Results for orders placed or performed during the hospital encounter of 06/17/17 (from the past 48 hour(s))  Basic metabolic panel     Status: Abnormal   Collection Time: 06/17/17  9:43 PM  Result Value Ref Range   Sodium 140 135 - 145 mmol/L   Potassium 4.1 3.5 - 5.1 mmol/L   Chloride 105 101 - 111 mmol/L   CO2 27 22 - 32 mmol/L   Glucose, Bld 125 (H) 65 - 99 mg/dL   BUN 23 (H) 6 - 20 mg/dL   Creatinine, Ser 1.19 0.61 - 1.24 mg/dL   Calcium 8.9 8.9 - 10.3 mg/dL   GFR calc non Af Amer >60 >60 mL/min   GFR calc Af Amer >60 >60 mL/min    Comment: (NOTE) The eGFR has been calculated using the CKD EPI equation. This calculation has not been validated in all clinical situations. eGFR's persistently <60 mL/min signify possible Chronic Kidney Disease.    Anion gap 8 5 - 15  CBC     Status: None   Collection Time: 06/17/17  9:43 PM  Result Value Ref Range   WBC 9.5 4.0 - 10.5 K/uL   RBC 4.64 4.22 - 5.81 MIL/uL   Hemoglobin 14.1 13.0 - 17.0 g/dL   HCT 40.9 39.0 - 52.0 %   MCV 88.1 78.0 - 100.0 fL   MCH 30.4 26.0 - 34.0 pg   MCHC 34.5 30.0 - 36.0 g/dL   RDW 12.5 11.5 - 15.5 %   Platelets 240 150 -  400 K/uL  POCT i-Stat troponin I     Status: None   Collection Time: 06/17/17  9:54 PM  Result Value Ref Range   Troponin i, poc 0.01 0.00 - 0.08 ng/mL   Comment 3            Comment: Due to the release kinetics of cTnI, a negative result within the first hours of the onset of symptoms does not rule out myocardial infarction with certainty. If myocardial infarction is still suspected, repeat the test at appropriate intervals.    Dg Chest 2 View  Result Date: 06/17/2017 CLINICAL DATA:  Chest pain EXAM: CHEST  2 VIEW COMPARISON:  Chest CT 07/02/2016 FINDINGS: The heart size and mediastinal contours are within normal limits. Both lungs are clear. The visualized skeletal structures are unremarkable. Known pulmonary nodules seen on prior CT are not visible on this study. IMPRESSION: No active cardiopulmonary disease. Electronically Signed   By: Eric Rhodes M.D.   On: 06/17/2017 21:27    Review Of Systems Constitutional: No fever, chills, weight loss or gain. Eyes: No vision change, wears glasses. No discharge or pain. Ears: No  hearing loss, No tinnitus. Respiratory: No asthma, COPD, pneumonias. No shortness of breath. No hemoptysis. Cardiovascular: Occasional chest pain, palpitation, no leg edema. Gastrointestinal: No nausea, vomiting, diarrhea, constipation. No GI bleed. No hepatitis. Genitourinary: No dysuria, hematuria, kidney stone. No incontinance. Neurological: No headache, stroke, seizures.  Psychiatry: No psych facility admission for anxiety, depression, suicide. No detox. Skin: No rash. Musculoskeletal: No joint pain, fibromyalgia. No neck pain, back pain. Lymphadenopathy: No lymphadenopathy. Hematology: No anemia or easy bruising.   Blood pressure 130/69, pulse 64, temperature 98 F (36.7 C), temperature source Oral, resp. rate 20, height 5' 3.5" (1.613 m), weight 68 kg (150 lb), SpO2 99 %. Body mass index is 26.15 kg/m. General appearance: alert, cooperative, appears stated age and no distress Head: Normocephalic, atraumatic. Eyes: Hazel eyes, pink conjunctiva, corneas clear. PERRL, EOM's intact. Neck: No adenopathy, no carotid bruit, no JVD, supple, symmetrical, trachea midline and thyroid not enlarged. Resp: Clear to auscultation bilaterally. Cardio: Regular rate and rhythm, S1, S2 normal, II/VI systolic murmur, no click, rub or gallop GI: Soft, non-tender; bowel sounds normal; no organomegaly. Extremities: No edema, cyanosis or clubbing. Skin: Warm and dry.  Neurologic: Alert and oriented X 3, normal strength. Normal coordination and gait.  Assessment/Plan Chest pain r/o MI GERD Non-sustained VT Dizziness  Place in observation. Nuclear stress test in AM.  Eric Riddle, MD  06/18/2017, 12:25 AM

## 2017-06-18 NOTE — Progress Notes (Signed)
ANTICOAGULATION CONSULT NOTE - Initial Consult  Pharmacy Consult for IV heparin Indication: chest pain/ACS  Allergies  Allergen Reactions  . Augmentin [Amoxicillin-Pot Clavulanate] Other (See Comments)    Causes GI cramps  . Betadine [Povidone Iodine]     Skin sensitive    Patient Measurements: Height: 5' 3.5" (161.3 cm) Weight: 152 lb 5.4 oz (69.1 kg) IBW/kg (Calculated) : 58.05 Heparin Dosing Weight: 61  Vital Signs: Temp: 98 F (36.7 C) (10/17 0710) Temp Source: Oral (10/17 0710) BP: 111/63 (10/17 0710) Pulse Rate: 54 (10/17 0710)  Labs:  Recent Labs  06/17/17 2143 06/18/17 0028 06/18/17 0103 06/18/17 0459 06/18/17 0833  HGB 14.1  --   --  14.0  --   HCT 40.9  --   --  41.9  --   PLT 240  --   --  221  --   APTT  --   --  33  --   --   LABPROT  --   --  14.6  --   --   INR  --   --  1.15  --   --   HEPARINUNFRC  --   --   --   --  0.22*  CREATININE 1.19  --   --  1.12  --   TROPONINI  --  <0.03  --  <0.03  --     Estimated Creatinine Clearance: 56.2 mL/min (by C-G formula based on SCr of 1.12 mg/dL).   Medical History: Past Medical History:  Diagnosis Date  . GERD (gastroesophageal reflux disease)    diet controlled  . History of colonoscopy 2004  . History of endoscopy 2004  . Irritable bowel disease     Medications:  Scheduled:  . [START ON 06/19/2017] aspirin EC  81 mg Oral Daily  . atorvastatin  80 mg Oral q1800  . [START ON 06/19/2017] Influenza vac split quadrivalent PF  0.5 mL Intramuscular Tomorrow-1000  . metoprolol tartrate  12.5 mg Oral BID  . zolpidem  5 mg Oral QHS   Infusions:  . heparin 750 Units/hr (06/18/17 0105)    Assessment: 69 yoM with chest pain. IV heparin for ACS.   06/18/2017 HL= 0.22, subtherapeutic H/H WNL Baseline APTT and PT/INR: WNL No issues per RN  Goal of Therapy:  Heparin level 0.3-0.7 units/ml Monitor platelets by anticoagulation protocol: Yes   Plan:  Bolus heparin 900 units x 1 Increase  heparin drip to 850 units/hr Check  HL in 6 hours Daily CBC/HL  Dolly Rias RPh 06/18/2017, 10:22 AM Pager [phone]

## 2017-06-18 NOTE — Progress Notes (Signed)
Please call report to [facility] at [phone]. [phone]

## 2017-06-19 ENCOUNTER — Other Ambulatory Visit (HOSPITAL_COMMUNITY): Payer: BLUE CROSS/BLUE SHIELD

## 2017-06-23 DIAGNOSIS — I712 Thoracic aortic aneurysm, without rupture: Secondary | ICD-10-CM | POA: Diagnosis not present

## 2017-06-23 DIAGNOSIS — R072 Precordial pain: Secondary | ICD-10-CM | POA: Diagnosis not present

## 2017-06-23 DIAGNOSIS — I361 Nonrheumatic tricuspid (valve) insufficiency: Secondary | ICD-10-CM | POA: Diagnosis not present

## 2017-06-25 DIAGNOSIS — K219 Gastro-esophageal reflux disease without esophagitis: Secondary | ICD-10-CM | POA: Diagnosis not present

## 2017-06-25 DIAGNOSIS — R002 Palpitations: Secondary | ICD-10-CM | POA: Diagnosis not present

## 2017-06-25 DIAGNOSIS — I712 Thoracic aortic aneurysm, without rupture: Secondary | ICD-10-CM | POA: Diagnosis not present

## 2017-06-25 DIAGNOSIS — R0789 Other chest pain: Secondary | ICD-10-CM | POA: Diagnosis not present

## 2017-06-26 ENCOUNTER — Telehealth: Payer: Self-pay

## 2017-06-26 NOTE — Telephone Encounter (Signed)
Sent notes to scheduling 

## 2017-07-03 DIAGNOSIS — K253 Acute gastric ulcer without hemorrhage or perforation: Secondary | ICD-10-CM | POA: Diagnosis not present

## 2017-07-03 DIAGNOSIS — K259 Gastric ulcer, unspecified as acute or chronic, without hemorrhage or perforation: Secondary | ICD-10-CM | POA: Diagnosis not present

## 2017-07-03 DIAGNOSIS — K648 Other hemorrhoids: Secondary | ICD-10-CM | POA: Diagnosis not present

## 2017-07-03 DIAGNOSIS — K219 Gastro-esophageal reflux disease without esophagitis: Secondary | ICD-10-CM | POA: Diagnosis not present

## 2017-07-03 DIAGNOSIS — K2960 Other gastritis without bleeding: Secondary | ICD-10-CM | POA: Diagnosis not present

## 2017-07-03 DIAGNOSIS — K2970 Gastritis, unspecified, without bleeding: Secondary | ICD-10-CM | POA: Diagnosis not present

## 2017-07-03 DIAGNOSIS — R0789 Other chest pain: Secondary | ICD-10-CM | POA: Diagnosis not present

## 2017-07-03 DIAGNOSIS — Z1211 Encounter for screening for malignant neoplasm of colon: Secondary | ICD-10-CM | POA: Diagnosis not present

## 2017-07-03 DIAGNOSIS — K625 Hemorrhage of anus and rectum: Secondary | ICD-10-CM | POA: Diagnosis not present

## 2017-07-03 DIAGNOSIS — R1013 Epigastric pain: Secondary | ICD-10-CM | POA: Diagnosis not present

## 2017-07-04 ENCOUNTER — Telehealth: Payer: Self-pay

## 2017-07-04 ENCOUNTER — Other Ambulatory Visit: Payer: Self-pay | Admitting: Family Medicine

## 2017-07-04 DIAGNOSIS — I712 Thoracic aortic aneurysm, without rupture, unspecified: Secondary | ICD-10-CM

## 2017-07-04 NOTE — Telephone Encounter (Signed)
SENT NOTES TO SCHEDULING 

## 2017-07-08 ENCOUNTER — Ambulatory Visit
Admission: RE | Admit: 2017-07-08 | Discharge: 2017-07-08 | Disposition: A | Discharge status: Home / Self Care | Payer: BLUE CROSS/BLUE SHIELD | Source: Ambulatory Visit | Attending: Family Medicine | Admitting: Family Medicine

## 2017-07-08 DIAGNOSIS — I712 Thoracic aortic aneurysm, without rupture, unspecified: Secondary | ICD-10-CM

## 2017-07-08 MED ORDER — IOPAMIDOL (ISOVUE-370) INJECTION 76%
75.0000 mL | Freq: Once | INTRAVENOUS | Status: AC | PRN
  Administered 2017-07-08: 75 mL via INTRAVENOUS

## 2017-07-09 IMAGING — CT CT ANGIO CHEST
2 of 6 series · 19 of 46 positions shown · IV contrast (isovue)
Comparison: [DATE].

CLINICAL DATA: Thoracic aneurysm common nodules.

EXAM:
CT ANGIOGRAPHY CHEST WITH CONTRAST
TECHNIQUE: Multidetector CT imaging of the chest was performed using the
standard protocol during bolus administration of intravenous
contrast. Multiplanar CT image reconstructions and MIPs were
obtained to evaluate the vascular anatomy.
CONTRAST:  75 cc Isovue 370.

[Series 4: chest angio · axial · 0.63mm/px · z∈[+789,+1068]mm · 16 of 105 slices shown]
[im 6/105  lung]
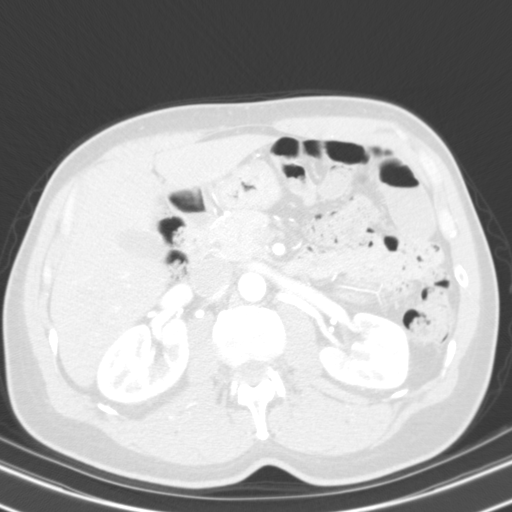
[im 11/105  soft-tissue]
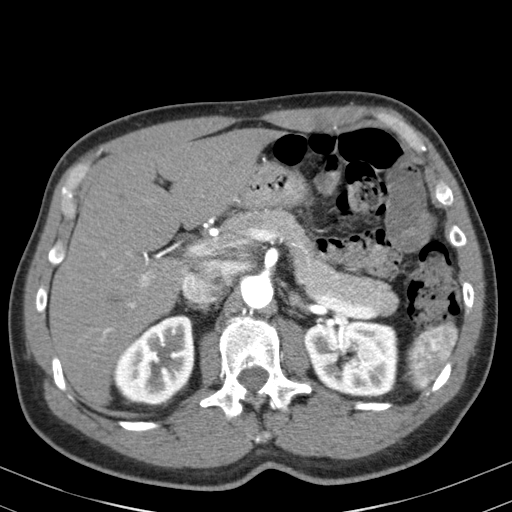
[im 16/105  lung]
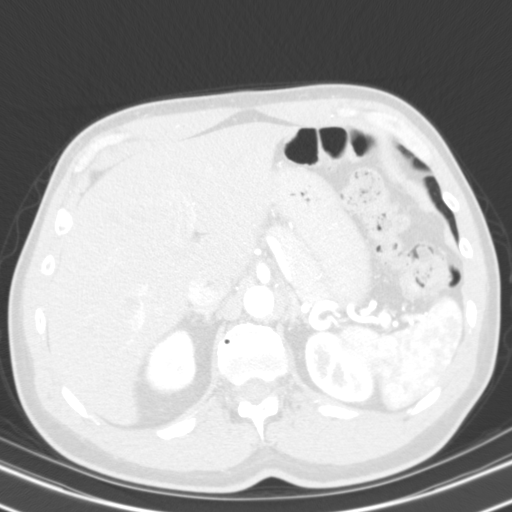
[im 27/105  soft-tissue]
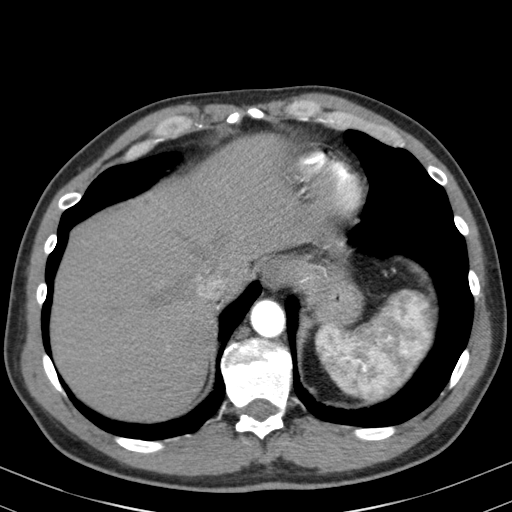
[im 32/105  lung]
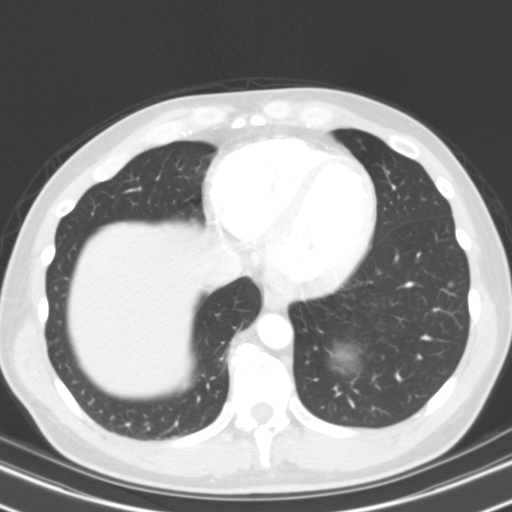
[im 37/105  soft-tissue]
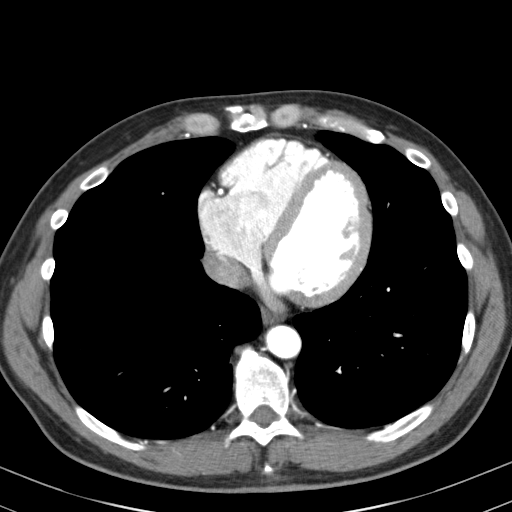
[im 42/105  lung]
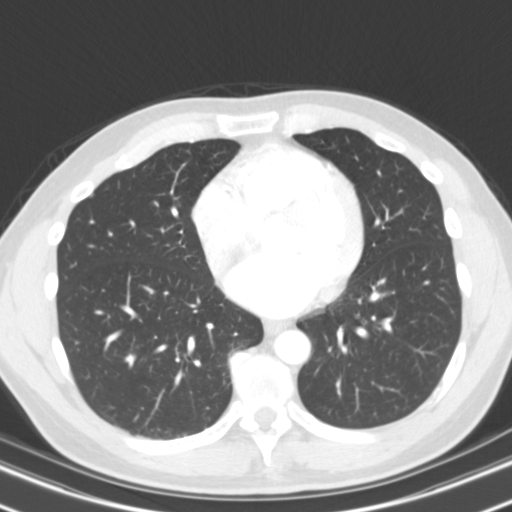
[im 47/105  soft-tissue]
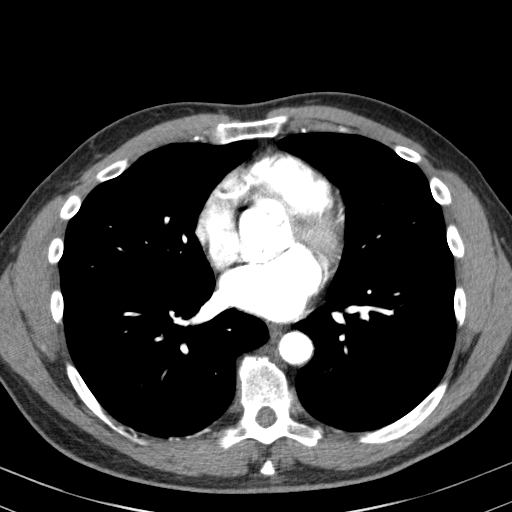
[im 58/105  lung]
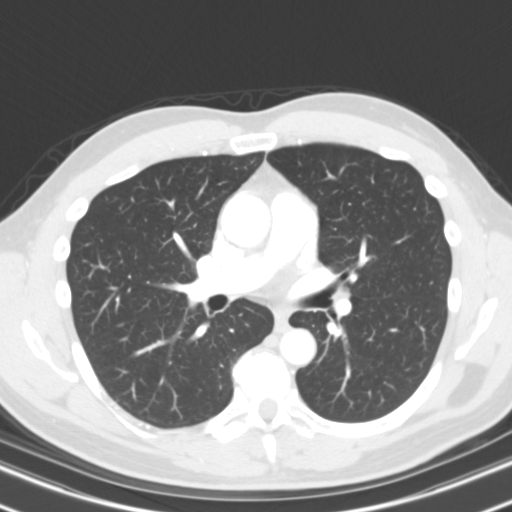
[im 63/105  soft-tissue]
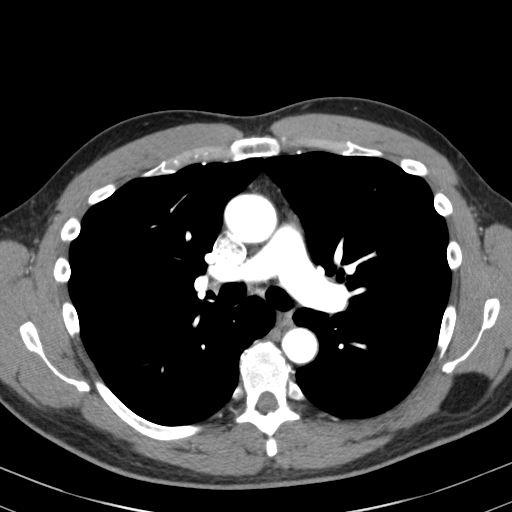
[im 68/105  lung]
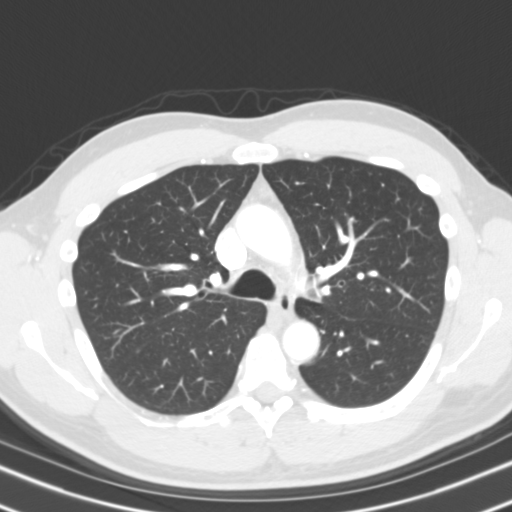
[im 73/105  soft-tissue]
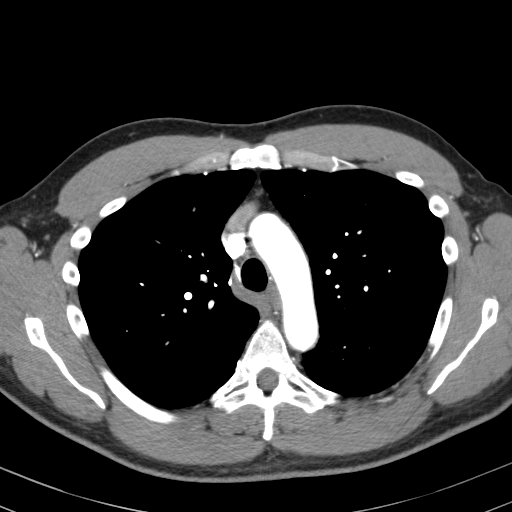
[im 79/105  lung]
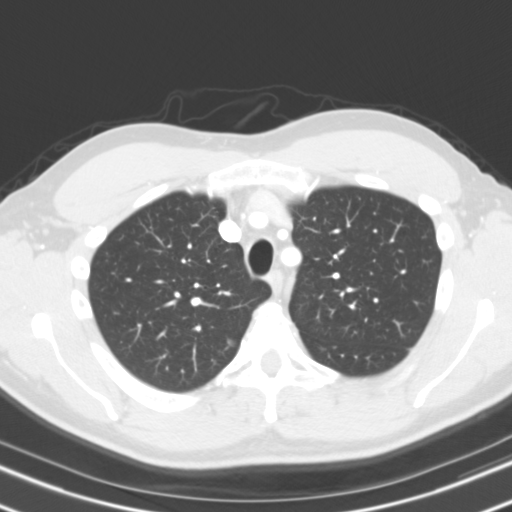
[im 89/105  soft-tissue]
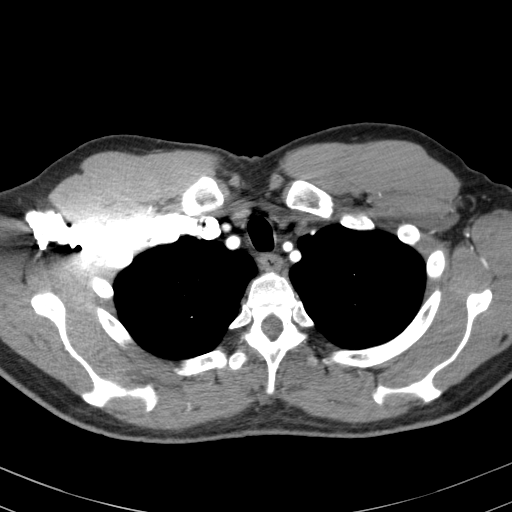
[im 94/105  lung]
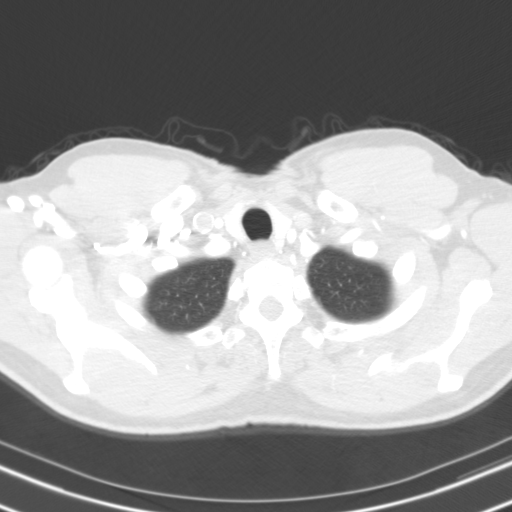
[im 99/105  soft-tissue]
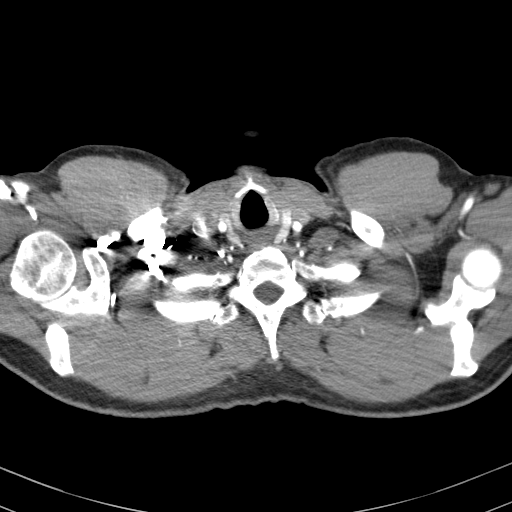

[Series 5: chest angio cor · coronal · 0.60mm/px · 3 of 128 slices shown]
[im 32/128  soft-tissue]
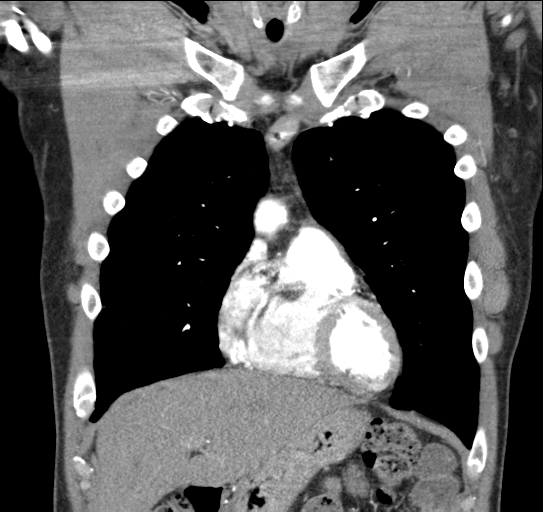
[im 64/128  soft-tissue]
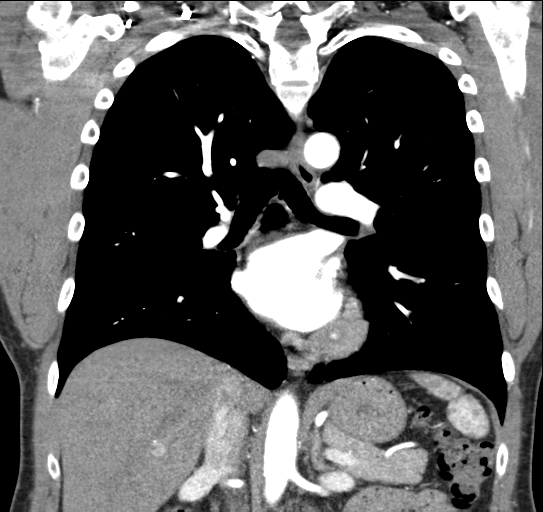
[im 96/128  soft-tissue]
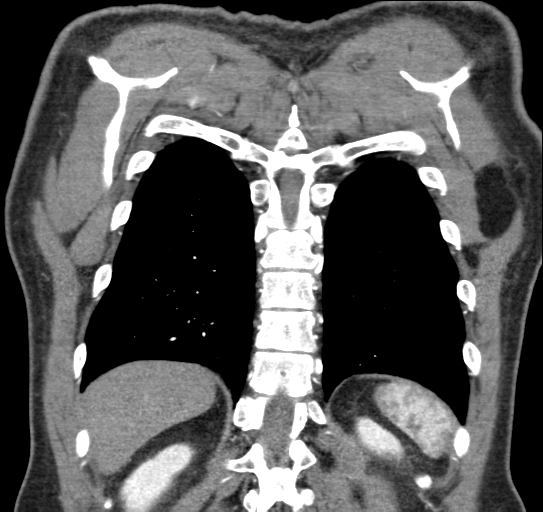

[19 of 46 positions shown; findings below may reference images not displayed]

FINDINGS: Cardiovascular: Ascending aorta measures 2.8 cm, within normal
limits. Vascular structures are unremarkable. Heart size normal. No
pericardial effusion.

Mediastinum/Nodes: No pathologically enlarged mediastinal, hilar or
axillary lymph nodes. Esophagus is grossly unremarkable.

Lungs/Pleura: Scattered smudgy subpleural nodules measure up to 5 mm
in the right upper lobe, stable. 5 mm left lower lobe nodule (series
11, image 45), also stable. No pleural fluid. Airway is
unremarkable.

Upper Abdomen: Visualized portions of the liver, gallbladder,
adrenal glands, kidneys, spleen, pancreas, stomach and bowel are
grossly unremarkable. No upper abdominal adenopathy.

Musculoskeletal: Degenerative changes in the spine. No worrisome
lytic or sclerotic lesions.

Review of the MIP images confirms the above findings.
IMPRESSION: 1. Scattered pulmonary nodules are unchanged from [DATE] and
considered benign.
2. No evidence of a thoracic aortic aneurysm.

## 2017-07-16 ENCOUNTER — Ambulatory Visit (INDEPENDENT_AMBULATORY_CARE_PROVIDER_SITE_OTHER): Payer: BLUE CROSS/BLUE SHIELD | Admitting: Cardiology

## 2017-07-16 ENCOUNTER — Encounter (INDEPENDENT_AMBULATORY_CARE_PROVIDER_SITE_OTHER): Payer: Self-pay

## 2017-07-16 ENCOUNTER — Encounter: Payer: Self-pay | Admitting: Cardiology

## 2017-07-16 VITALS — BP 110/64 | HR 60 | Ht 63.5 in | Wt 152.0 lb

## 2017-07-16 DIAGNOSIS — I2510 Atherosclerotic heart disease of native coronary artery without angina pectoris: Secondary | ICD-10-CM

## 2017-07-16 DIAGNOSIS — R072 Precordial pain: Secondary | ICD-10-CM | POA: Diagnosis not present

## 2017-07-16 DIAGNOSIS — I10 Essential (primary) hypertension: Secondary | ICD-10-CM | POA: Diagnosis not present

## 2017-07-16 DIAGNOSIS — R002 Palpitations: Secondary | ICD-10-CM | POA: Diagnosis not present

## 2017-07-16 DIAGNOSIS — E785 Hyperlipidemia, unspecified: Secondary | ICD-10-CM | POA: Diagnosis not present

## 2017-07-16 MED ORDER — ATORVASTATIN CALCIUM 40 MG PO TABS: 40.0000 mg | ORAL_TABLET | Freq: Every day | ORAL | 3 refills | Status: DC

## 2017-07-16 MED ORDER — METOPROLOL TARTRATE 25 MG PO TABS: 12.5000 mg | ORAL_TABLET | Freq: Two times a day (BID) | ORAL | 3 refills | Status: DC

## 2017-07-16 MED ORDER — LOSARTAN POTASSIUM 25 MG PO TABS: 25.0000 mg | ORAL_TABLET | Freq: Every day | ORAL | 3 refills | Status: DC

## 2017-07-16 NOTE — Patient Instructions (Signed)
Medication Instructions:   Your physician recommends that you continue on your current medications as directed. Please refer to the Current Medication list given to you today.    Labwork:  TOMORROW 07/17/17--BMET AND LIPIDS--PLEASE COME FASTING TO THIS LAB APPOINTMENT     Follow-Up:  4 MONTHS WITH DR Meda Coffee       If you need a refill on your cardiac medications before your next appointment, please call your pharmacy.

## 2017-07-16 NOTE — Progress Notes (Signed)
Cardiology Office Note:    Date:  07/17/2017   ID:  Jory Ee Meyer, DOB [birth year], MRN [id number]  PCP:  Kathyrn Lass, MD  Cardiologist:  Ena Dawley, MD   Referring MD: Kathyrn Lass, MD   Chief complain: Chest pain, palpitations  History of Present Illness:    Eric Rhodes is a 61 y.o. male with a hx of hypertension hyperlipidemia, sudden cardiac death in his brother, GERD treated by PPIs in the past who visited ER on October 25 for concern of chest pain and palpitations. The patient is an avid biker who writes 40 miles several times a week with no symptoms. That day he was also riding a bike and was asymptomatic, later on that night after dinner he developed retrosternal chest pain with diaphoresis and dizziness as well as palpitations. He went to the ER and rule out for ACS. He underwent a stress test that was negative for ischemia. His LVEF was normal. He was seen by Dr. Doylene Canard who performed an echocardiogram that showed normal LV size and function normal RV size and function, impaired relaxation, normal size of the ascending aorta, trace mitral tricuspid, pulmonic and aortic regurgitation. He was started on low-dose of losartan and metoprolol. Today he states that he just underwent an EGD and was found to have gastric ulcer and had no other episodes of chest pain. He was also started on Lipitor that he is tolerating well. He and his wife are extremely careful about diet their strict vegans, they weight their food.  Past Medical History:  Diagnosis Date  . BPH (benign prostatic hyperplasia)   . Chronic abdominal pain   . Chronic insomnia   . GERD (gastroesophageal reflux disease)    diet controlled  . H/O ETOH abuse   . Hernia, inguinal, left   . History of colonoscopy 2004  . History of endoscopy 2004  . Irritable bowel disease   . Prostatitis     Past Surgical History:  Procedure Laterality Date  . ANTERIOR CRUCIATE LIGAMENT REPAIR Left 1978  . BUNIONECTOMY     . COLON MEDOFF    . COLONOSCOPY    . HEMORRHOIDAL BANDING  10/2014  . HERNIA REPAIR Left 11/2011  . INGUINAL HERNIA REPAIR  11/07/2011   Procedure: HERNIA REPAIR INGUINAL ADULT;  Surgeon: Shann Medal, MD;  Location: [facility];  Service: General;  Laterality: Left;  Left Inguinal Herniorrhaphy with Mesh  . UPPER GASTROINTESTINAL ENDOSCOPY    . WISDOM TOOTH EXTRACTION      Current Medications: Current Meds  Medication Sig  . ALPRAZolam (XANAX) 0.5 MG tablet Take 0.5 mg daily as needed by mouth for anxiety.  . Amino Acids (L-CARNITINE PO) Take 1 tablet by mouth daily.  Marland Kitchen aspirin 81 MG EC tablet Take 81 mg by mouth daily. Swallow whole.  Marland Kitchen atorvastatin (LIPITOR) 40 MG tablet Take 1 tablet (40 mg total) daily by mouth.  . cetirizine (ZYRTEC) 10 MG tablet Take 10 mg daily by mouth.  . cholecalciferol (VITAMIN D) 1000 units tablet Take 1,000 Units by mouth daily.  Marland Kitchen co-enzyme Q-10 30 MG capsule Take 30 mg by mouth daily.  . diclofenac sodium (VOLTAREN) 1 % GEL Apply 2 g every 6 (six) hours as needed topically.  . fluticasone (FLONASE) 50 MCG/ACT nasal spray Place 2 sprays into both nostrils daily.  Marland Kitchen L-Methylfolate-Algae-B12-B6 (METANX) 3-90.314-2-35 MG CAPS Take 2 (two) times daily by mouth.  . losartan (COZAAR) 25 MG tablet Take 1 tablet (25 mg  total) daily by mouth.  . metoprolol tartrate (LOPRESSOR) 25 MG tablet Take 0.5 tablets (12.5 mg total) 2 (two) times daily by mouth.  . Multiple Vitamin (MULTIVITAMIN) tablet Take 1 tablet daily by mouth.  . nitroGLYCERIN (NITROSTAT) 0.4 MG SL tablet Place 1 tablet (0.4 mg total) under the tongue every 5 (five) minutes x 3 doses as needed for chest pain.  . Omega-3 Fatty Acids (FISH OIL PO) Take 1 capsule by mouth 2 (two) times daily.  . pantoprazole (PROTONIX) 40 MG tablet Take 1 tablet (40 mg total) by mouth daily.  . sucralfate (CARAFATE) 1 GM/10ML suspension Take 1 g 4 (four) times daily -  with meals and at bedtime by mouth.    . THEANINE PO Take 300 mg daily by mouth.  . therapeutic multivitamin-minerals (THERAGRAN-M) tablet Take 1 tablet by mouth daily.  . vitamin B-12 (CYANOCOBALAMIN) 100 MCG tablet Take 100 mcg by mouth daily.  Marland Kitchen zolpidem (AMBIEN) 5 MG tablet Take 5 mg by mouth at bedtime as needed for sleep.  . [DISCONTINUED] atorvastatin (LIPITOR) 40 MG tablet Take 1 tablet (40 mg total) by mouth daily.  . [DISCONTINUED] losartan (COZAAR) 25 MG tablet Take 25 mg daily by mouth.  . [DISCONTINUED] metoprolol tartrate (LOPRESSOR) 25 MG tablet Take 12.5 mg 2 (two) times daily by mouth.     Allergies:   Augmentin [amoxicillin-pot clavulanate]; Betadine [povidone iodine]; and Clavulanic acid   Social History   Socioeconomic History  . Marital status: Married    Spouse name: None  . Number of children: None  . Years of education: None  . Highest education level: None  Social Needs  . Financial resource strain: None  . Food insecurity - worry: None  . Food insecurity - inability: None  . Transportation needs - medical: None  . Transportation needs - non-medical: None  Occupational History  . None  Tobacco Use  . Smoking status: Never Smoker  . Smokeless tobacco: Never Used  Substance and Sexual Activity  . Alcohol use: No  . Drug use: No  . Sexual activity: None  Other Topics Concern  . None  Social History Narrative  . None    Family History: The patient's family history includes ADD / ADHD in his son and son; COPD in his father; Clotting disorder in his mother; Diabetes in his brother; Heart attack in his brother; Heart disease in his mother; Hypercholesterolemia in his mother; Hypertension in his father and mother; Myelodysplastic syndrome in his mother; Obesity in his brother; Psoriasis in his brother.   ROS:   Please see the history of present illness.    All other systems reviewed and are negative.  EKGs/Labs/Other Studies Reviewed:    The following studies were reviewed today:  EKG:   EKG is not ordered today.  The ekg from the hospital shows normal sinus rhythm with frequent PVCs in a short run of nonsustained VT of 4 beats.  Recent Labs: 06/17/2017: TSH 3.164 06/18/2017: BUN 22; Creatinine, Ser 1.12; Hemoglobin 14.0; Platelets 221; Potassium 3.8; Sodium 142  Recent Lipid Panel    Component Value Date/Time   CHOL 176 06/18/2017 0459   TRIG 32 06/18/2017 0459   HDL 56 06/18/2017 0459   CHOLHDL 3.1 06/18/2017 0459   VLDL 6 06/18/2017 0459   LDLCALC 114 (H) 06/18/2017 0459    Physical Exam:    VS:  BP 110/64   Pulse 60   Ht 5' 3.5" (1.613 m)   Wt 152 lb (68.9 kg)  SpO2 98%   BMI 26.50 kg/m     Wt Readings from Last 3 Encounters:  07/16/17 152 lb (68.9 kg)  06/18/17 152 lb 5.4 oz (69.1 kg)  11/20/11 170 lb (77.1 kg)     GEN:  Well nourished, well developed in no acute distress HEENT: Normal NECK: No JVD; No carotid bruits LYMPHATICS: No lymphadenopathy CARDIAC: RRR, 2/6 systolic and diastolic murmurs, rubs, gallops RESPIRATORY:  Clear to auscultation without rales, wheezing or rhonchi  ABDOMEN: Soft, non-tender, non-distended MUSCULOSKELETAL:  No edema; No deformity  SKIN: Warm and dry NEUROLOGIC:  Alert and oriented x 3 PSYCHIATRIC:  Normal affect   ASSESSMENT:    1. Hyperlipidemia, unspecified hyperlipidemia type   2. Palpitations   3. Coronary artery disease involving native coronary artery of native heart without angina pectoris   4. Essential hypertension   5. Precordial pain    PLAN:    In order of problems listed above:  1. Chest pain very atypical, the patient has diagnosis of gastric ulcer that'll probably explain his presentation, he was started on metoprolol losartan aspirin and atorvastatin. I have personally reviewed his chest CTA that shows calcifications in 2 coronary arteries, we will treat aggressively conservatively, he is already on very high exercise regimen and they restricted diet. 2. Hypertension -  controlled 3. Hyperlipidemia - elevated LDL despite a very strict diet with LDL of 137, I would encourage to continue taking atorvastatin. We will recheck lipids and LFTs The patient is moving to [place] for the winter and will be back in March we will arrange for follow-up then.   Medication Adjustments/Labs and Tests Ordered: Current medicines are reviewed at length with the patient today.  Concerns regarding medicines are outlined above.  Orders Placed This Encounter  Procedures  . Basic Metabolic Panel (BMET)  . Lipid Profile   Meds ordered this encounter  Medications  . metoprolol tartrate (LOPRESSOR) 25 MG tablet    Sig: Take 0.5 tablets (12.5 mg total) 2 (two) times daily by mouth.    Dispense:  90 tablet    Refill:  3  . atorvastatin (LIPITOR) 40 MG tablet    Sig: Take 1 tablet (40 mg total) daily by mouth.    Dispense:  30 tablet    Refill:  3  . losartan (COZAAR) 25 MG tablet    Sig: Take 1 tablet (25 mg total) daily by mouth.    Dispense:  90 tablet    Refill:  3    Signed, Ena Dawley, MD  07/17/2017 8:22 AM    [place]

## 2017-07-17 ENCOUNTER — Other Ambulatory Visit: Payer: BLUE CROSS/BLUE SHIELD | Admitting: *Deleted

## 2017-07-17 DIAGNOSIS — R002 Palpitations: Secondary | ICD-10-CM

## 2017-07-17 DIAGNOSIS — E785 Hyperlipidemia, unspecified: Secondary | ICD-10-CM | POA: Diagnosis not present

## 2017-07-17 LAB — LIPID PANEL
Chol/HDL Ratio: 2.2 ratio (ref 0.0–5.0)
Cholesterol, Total: 112 mg/dL (ref 100–199)
HDL: 52 mg/dL (ref >39)
LDL Calculated: 49 mg/dL (ref 0–99)
Triglycerides: 54 mg/dL (ref 0–149)
VLDL Cholesterol Cal: 11 mg/dL (ref 5–40)

## 2017-07-17 LAB — BASIC METABOLIC PANEL
BUN/Creatinine Ratio: 19 (ref 10–24)
BUN: 19 mg/dL (ref 8–27)
CO2: 26 mmol/L (ref 20–29)
Calcium: 9.6 mg/dL (ref 8.6–10.2)
Chloride: 102 mmol/L (ref 96–106)
Creatinine, Ser: 0.99 mg/dL (ref 0.76–1.27)
GFR calc Af Amer: 94 mL/min/{1.73_m2} (ref >59)
GFR calc non Af Amer: 81 mL/min/{1.73_m2} (ref >59)
Glucose: 90 mg/dL (ref 65–99)
Potassium: 4.6 mmol/L (ref 3.5–5.2)
Sodium: 143 mmol/L (ref 134–144)

## 2017-07-17 MED ORDER — ATORVASTATIN CALCIUM 40 MG PO TABS: 40.0000 mg | ORAL_TABLET | Freq: Every day | ORAL | 3 refills | Status: DC

## 2017-07-17 NOTE — Addendum Note (Signed)
Addended by: Nuala Alpha on: 07/17/2017 02:19 PM   Modules accepted: Orders

## 2017-07-18 ENCOUNTER — Telehealth: Payer: Self-pay | Admitting: Cardiology

## 2017-07-18 ENCOUNTER — Encounter: Payer: Self-pay | Admitting: *Deleted

## 2017-07-18 NOTE — Telephone Encounter (Signed)
Patient calling, states that he has a question about his lab results

## 2017-07-18 NOTE — Telephone Encounter (Signed)
Notes recorded by Nuala Alpha, LPN on 31/54/0086 at 9:41 AM EST Spoke with the pt and made him aware of his lab results per Dr Meda Coffee.  Pt requested for this to be mailed to him, as well as routed to his PCP.  Pt verbalized understanding. ------  Notes recorded by Nuala Alpha, LPN on 76/19/5093 at 8:45 AM EST Left a detailed message on the pts confirmed VM that per Dr Meda Coffee, his labs showed normal LFTs, and great improvement in LDL.  Left a detailed message for the pt to call back with any additional questions regarding these results. ------  Notes recorded by Dorothy Spark, MD on 07/18/2017 at 8:06 AM EST Normal LFTs, great improvement in LDL.

## 2017-08-15 DIAGNOSIS — K259 Gastric ulcer, unspecified as acute or chronic, without hemorrhage or perforation: Secondary | ICD-10-CM | POA: Diagnosis not present

## 2017-08-15 DIAGNOSIS — R109 Unspecified abdominal pain: Secondary | ICD-10-CM | POA: Diagnosis not present

## 2017-08-15 DIAGNOSIS — R634 Abnormal weight loss: Secondary | ICD-10-CM | POA: Diagnosis not present

## 2017-08-18 DIAGNOSIS — I10 Essential (primary) hypertension: Secondary | ICD-10-CM | POA: Diagnosis not present

## 2017-08-18 DIAGNOSIS — K209 Esophagitis, unspecified: Secondary | ICD-10-CM | POA: Diagnosis not present

## 2017-08-18 DIAGNOSIS — R1013 Epigastric pain: Secondary | ICD-10-CM | POA: Diagnosis not present

## 2017-08-18 DIAGNOSIS — K2970 Gastritis, unspecified, without bleeding: Secondary | ICD-10-CM | POA: Diagnosis not present

## 2017-08-18 DIAGNOSIS — Z8711 Personal history of peptic ulcer disease: Secondary | ICD-10-CM | POA: Diagnosis not present

## 2017-08-18 DIAGNOSIS — K253 Acute gastric ulcer without hemorrhage or perforation: Secondary | ICD-10-CM | POA: Diagnosis not present

## 2017-08-18 DIAGNOSIS — R634 Abnormal weight loss: Secondary | ICD-10-CM | POA: Diagnosis not present

## 2017-08-18 DIAGNOSIS — K2950 Unspecified chronic gastritis without bleeding: Secondary | ICD-10-CM | POA: Diagnosis not present

## 2017-08-18 DIAGNOSIS — K219 Gastro-esophageal reflux disease without esophagitis: Secondary | ICD-10-CM | POA: Diagnosis not present

## 2017-08-20 DIAGNOSIS — R109 Unspecified abdominal pain: Secondary | ICD-10-CM | POA: Diagnosis not present

## 2017-08-24 DIAGNOSIS — R079 Chest pain, unspecified: Secondary | ICD-10-CM | POA: Diagnosis not present

## 2017-08-24 DIAGNOSIS — I10 Essential (primary) hypertension: Secondary | ICD-10-CM | POA: Diagnosis not present

## 2017-08-24 DIAGNOSIS — I2510 Atherosclerotic heart disease of native coronary artery without angina pectoris: Secondary | ICD-10-CM | POA: Diagnosis not present

## 2017-08-24 DIAGNOSIS — Z881 Allergy status to other antibiotic agents status: Secondary | ICD-10-CM | POA: Diagnosis not present

## 2017-08-24 DIAGNOSIS — R634 Abnormal weight loss: Secondary | ICD-10-CM | POA: Diagnosis not present

## 2017-08-24 DIAGNOSIS — I77810 Thoracic aortic ectasia: Secondary | ICD-10-CM | POA: Diagnosis not present

## 2017-08-24 DIAGNOSIS — Z880 Allergy status to penicillin: Secondary | ICD-10-CM | POA: Diagnosis not present

## 2017-08-24 DIAGNOSIS — K2970 Gastritis, unspecified, without bleeding: Secondary | ICD-10-CM | POA: Diagnosis not present

## 2017-08-24 DIAGNOSIS — E785 Hyperlipidemia, unspecified: Secondary | ICD-10-CM | POA: Diagnosis not present

## 2017-08-24 DIAGNOSIS — Z79899 Other long term (current) drug therapy: Secondary | ICD-10-CM | POA: Diagnosis not present

## 2017-08-24 DIAGNOSIS — K219 Gastro-esophageal reflux disease without esophagitis: Secondary | ICD-10-CM | POA: Diagnosis not present

## 2017-08-24 DIAGNOSIS — R0789 Other chest pain: Secondary | ICD-10-CM | POA: Diagnosis not present

## 2017-08-25 DIAGNOSIS — R0789 Other chest pain: Secondary | ICD-10-CM | POA: Diagnosis not present

## 2017-08-25 DIAGNOSIS — I77810 Thoracic aortic ectasia: Secondary | ICD-10-CM | POA: Diagnosis not present

## 2017-08-25 DIAGNOSIS — E785 Hyperlipidemia, unspecified: Secondary | ICD-10-CM | POA: Diagnosis not present

## 2017-08-25 DIAGNOSIS — R079 Chest pain, unspecified: Secondary | ICD-10-CM | POA: Diagnosis not present

## 2017-08-25 DIAGNOSIS — H409 Unspecified glaucoma: Secondary | ICD-10-CM | POA: Diagnosis not present

## 2017-08-25 DIAGNOSIS — I209 Angina pectoris, unspecified: Secondary | ICD-10-CM | POA: Diagnosis not present

## 2017-08-25 DIAGNOSIS — I25119 Atherosclerotic heart disease of native coronary artery with unspecified angina pectoris: Secondary | ICD-10-CM | POA: Diagnosis not present

## 2017-09-11 DIAGNOSIS — E782 Mixed hyperlipidemia: Secondary | ICD-10-CM | POA: Diagnosis not present

## 2017-09-11 DIAGNOSIS — K219 Gastro-esophageal reflux disease without esophagitis: Secondary | ICD-10-CM | POA: Diagnosis not present

## 2017-09-11 DIAGNOSIS — Z6826 Body mass index (BMI) 26.0-26.9, adult: Secondary | ICD-10-CM | POA: Diagnosis not present

## 2017-09-11 DIAGNOSIS — Z0000 Encounter for general adult medical examination without abnormal findings: Secondary | ICD-10-CM | POA: Diagnosis not present

## 2017-09-11 DIAGNOSIS — R5383 Other fatigue: Secondary | ICD-10-CM | POA: Diagnosis not present

## 2017-09-11 DIAGNOSIS — I10 Essential (primary) hypertension: Secondary | ICD-10-CM | POA: Diagnosis not present

## 2017-09-29 DIAGNOSIS — K219 Gastro-esophageal reflux disease without esophagitis: Secondary | ICD-10-CM | POA: Diagnosis not present

## 2017-09-29 DIAGNOSIS — R12 Heartburn: Secondary | ICD-10-CM | POA: Diagnosis not present

## 2017-09-29 DIAGNOSIS — K5900 Constipation, unspecified: Secondary | ICD-10-CM | POA: Diagnosis not present

## 2017-09-29 DIAGNOSIS — R109 Unspecified abdominal pain: Secondary | ICD-10-CM | POA: Diagnosis not present

## 2017-10-10 DIAGNOSIS — E782 Mixed hyperlipidemia: Secondary | ICD-10-CM | POA: Diagnosis not present

## 2017-10-16 DIAGNOSIS — E782 Mixed hyperlipidemia: Secondary | ICD-10-CM | POA: Diagnosis not present

## 2017-10-16 DIAGNOSIS — I10 Essential (primary) hypertension: Secondary | ICD-10-CM | POA: Diagnosis not present

## 2017-10-16 DIAGNOSIS — Z0000 Encounter for general adult medical examination without abnormal findings: Secondary | ICD-10-CM | POA: Diagnosis not present

## 2017-10-16 DIAGNOSIS — Z6826 Body mass index (BMI) 26.0-26.9, adult: Secondary | ICD-10-CM | POA: Diagnosis not present

## 2017-10-16 DIAGNOSIS — R5383 Other fatigue: Secondary | ICD-10-CM | POA: Diagnosis not present

## 2017-10-16 DIAGNOSIS — G4700 Insomnia, unspecified: Secondary | ICD-10-CM | POA: Diagnosis not present

## 2017-10-23 DIAGNOSIS — I6529 Occlusion and stenosis of unspecified carotid artery: Secondary | ICD-10-CM | POA: Diagnosis not present

## 2017-12-08 DIAGNOSIS — E7800 Pure hypercholesterolemia, unspecified: Secondary | ICD-10-CM | POA: Diagnosis not present

## 2017-12-10 ENCOUNTER — Ambulatory Visit: Payer: BLUE CROSS/BLUE SHIELD | Admitting: Cardiology

## 2017-12-10 ENCOUNTER — Encounter: Payer: Self-pay | Admitting: Cardiology

## 2017-12-10 VITALS — BP 120/68 | HR 50 | Ht 63.5 in | Wt 155.2 lb

## 2017-12-10 DIAGNOSIS — R072 Precordial pain: Secondary | ICD-10-CM | POA: Diagnosis not present

## 2017-12-10 DIAGNOSIS — E785 Hyperlipidemia, unspecified: Secondary | ICD-10-CM | POA: Diagnosis not present

## 2017-12-10 DIAGNOSIS — R002 Palpitations: Secondary | ICD-10-CM

## 2017-12-10 DIAGNOSIS — I10 Essential (primary) hypertension: Secondary | ICD-10-CM | POA: Diagnosis not present

## 2017-12-10 DIAGNOSIS — I2584 Coronary atherosclerosis due to calcified coronary lesion: Secondary | ICD-10-CM | POA: Diagnosis not present

## 2017-12-10 DIAGNOSIS — I2510 Atherosclerotic heart disease of native coronary artery without angina pectoris: Secondary | ICD-10-CM

## 2017-12-10 DIAGNOSIS — Z789 Other specified health status: Secondary | ICD-10-CM | POA: Diagnosis not present

## 2017-12-10 NOTE — Patient Instructions (Signed)
Medication Instructions:   STOP TAKING CRESTOR NOW  STOP TAKING METOPROLOL NOW    Testing/Procedures:  CORONARY CT  Please arrive at the [facility] main entrance of [facility] at xx:xx AM (30-45 minutes prior to test start time)  [facility] [address], [place] [address] [phone]  Proceed to the [facility] Radiology Department (First Floor).  Please follow these instructions carefully (unless otherwise directed):   On the Night Before the Test: . Drink plenty of water. . Do not consume any caffeinated/decaffeinated beverages or chocolate 12 hours prior to your test. . Do not take any antihistamines 12 hours prior to your test.   On the Day of the Test: . Drink plenty of water. Do not drink any water within one hour of the test. . Do not eat any food 4 hours prior to the test. . You may take your regular medications prior to the test.   After the Test: . Drink plenty of water. . After receiving IV contrast, you may experience a mild flushed feeling. This is normal. . On occasion, you may experience a mild rash up to 24 hours after the test. This is not dangerous. If this occurs, you can take Benadryl 25 mg and increase your fluid intake. . If you experience trouble breathing, this can be serious. If it is severe call 911 IMMEDIATELY. If it is mild, please call our office.     Follow-Up:  NEW PATIENT APPOINTMENT AT VERY NEXT AVAILABLE WITH OUR LIPID CLINIC       If you need a refill on your cardiac medications before your next appointment, please call your pharmacy.

## 2017-12-10 NOTE — Progress Notes (Signed)
Cardiology Office Note:    Date:  12/10/2017   ID:  Jory Ee Deroo, DOB 07-Sep-1955, MRN [id number]  PCP:  Kathyrn Lass, MD  Cardiologist:  Ena Dawley, MD   Referring MD: Kathyrn Lass, MD   Chief complain: Chest pain  History of Present Illness:    NIKOLAY DEMETRIOU is a 62 y.o. male with a hx of hypertension hyperlipidemia, sudden cardiac death in his brother, GERD treated by PPIs in the past who visited ER on October 25 for concern of chest pain and palpitations. The patient is an avid biker who writes 40 miles several times a week with no symptoms. That day he was also riding a bike and was asymptomatic, later on that night after dinner he developed retrosternal chest pain with diaphoresis and dizziness as well as palpitations. He went to the ER and rule out for ACS. He underwent a stress test that was negative for ischemia. His LVEF was normal. He was seen by Dr. Doylene Canard who performed an echocardiogram that showed normal LV size and function normal RV size and function, impaired relaxation, normal size of the ascending aorta, trace mitral tricuspid, pulmonic and aortic regurgitation. He was started on low-dose of losartan and metoprolol. Today he states that he just underwent an EGD and was found to have gastric ulcer and had no other episodes of chest pain. He was also started on Lipitor that he is tolerating well. He and his wife are extremely careful about diet their strict vegans, they weight their food.  12/10/2017 - the patient is coming after 5 months, he went to [place] where he lives in winter, he developed significant abdominal pain with bloating, it is very difficult for the patient to distinguish whether this is epigastric pain vs chest pain.he underwent EGD and colonoscopy in [place] and was told that his ulcers are healed. He eventually went to the ER with chest pain, ACS was ruled out and stress echocardiogram was negative for ischemia. He continues to have symptoms of  overflow diarrhea secondary to constipation, per patient's symptoms typical for irritable bowel syndrome, he has tried different gastroenterologist that don't have a right answer or treatment for this patient and he will try to find another one.He continues to have chest pain sthat are sharp and can also feel like pressure,last week he had chest pain while biking.  It appears that his symptoms are related to statins, he tried to discontinue atorvastatin with significant improvement of symptoms, he was prescribed Crestor with similar symptoms, however he continues to take it.  Past Medical History:  Diagnosis Date  . BPH (benign prostatic hyperplasia)   . Chronic abdominal pain   . Chronic insomnia   . GERD (gastroesophageal reflux disease)    diet controlled  . H/O ETOH abuse   . Hernia, inguinal, left   . History of colonoscopy 2004  . History of endoscopy 2004  . Irritable bowel disease   . Prostatitis     Past Surgical History:  Procedure Laterality Date  . ANTERIOR CRUCIATE LIGAMENT REPAIR Left 1978  . BUNIONECTOMY    . COLON MEDOFF    . COLONOSCOPY    . HEMORRHOIDAL BANDING  10/2014  . HERNIA REPAIR Left 11/2011  . INGUINAL HERNIA REPAIR  11/07/2011   Procedure: HERNIA REPAIR INGUINAL ADULT;  Surgeon: Shann Medal, MD;  Location: [facility];  Service: General;  Laterality: Left;  Left Inguinal Herniorrhaphy with Mesh  . UPPER GASTROINTESTINAL ENDOSCOPY    . WISDOM  TOOTH EXTRACTION      Current Medications: Current Meds  Medication Sig  . ALPRAZolam (XANAX) 0.5 MG tablet Take 0.5 mg daily as needed by mouth for anxiety.  . Amino Acids (L-CARNITINE PO) Take 1 tablet by mouth daily.  Marland Kitchen aspirin 81 MG EC tablet Take 81 mg by mouth daily. Swallow whole.  . cetirizine (ZYRTEC) 10 MG tablet Take 10 mg daily by mouth.  . cholecalciferol (VITAMIN D) 1000 units tablet Take 1,000 Units by mouth daily.  Marland Kitchen co-enzyme Q-10 30 MG capsule Take 30 mg by mouth daily.  Marland Kitchen  dexlansoprazole (DEXILANT) 60 MG capsule Take 60 mg by mouth as directed.  . diclofenac sodium (VOLTAREN) 1 % GEL Apply topically as directed.  . fluticasone (FLONASE) 50 MCG/ACT nasal spray Place 2 sprays into both nostrils daily.  Marland Kitchen L-Methylfolate-Algae-B12-B6 (METANX) 3-90.314-2-35 MG CAPS Take 2 (two) times daily by mouth.  . losartan (COZAAR) 25 MG tablet Take 1 tablet (25 mg total) daily by mouth.  . Multiple Vitamin (MULTIVITAMIN) tablet Take 1 tablet daily by mouth.  . nitroGLYCERIN (NITROSTAT) 0.4 MG SL tablet Place 1 tablet (0.4 mg total) under the tongue every 5 (five) minutes x 3 doses as needed for chest pain.  . Omega-3 Fatty Acids (FISH OIL PO) Take 1 capsule by mouth 2 (two) times daily.  . pantoprazole (PROTONIX) 40 MG tablet Take 1 tablet (40 mg total) by mouth daily.  . sucralfate (CARAFATE) 1 GM/10ML suspension Take 1 g 4 (four) times daily -  with meals and at bedtime by mouth.  . THEANINE PO Take 300 mg daily by mouth.  . therapeutic multivitamin-minerals (THERAGRAN-M) tablet Take 1 tablet by mouth daily.  . vitamin B-12 (CYANOCOBALAMIN) 100 MCG tablet Take 100 mcg by mouth daily.  Marland Kitchen zolpidem (AMBIEN) 5 MG tablet Take 5 mg by mouth at bedtime as needed for sleep.  . [DISCONTINUED] metoprolol tartrate (LOPRESSOR) 25 MG tablet Take 0.5 tablets (12.5 mg total) 2 (two) times daily by mouth.  . [DISCONTINUED] rosuvastatin (CRESTOR) 5 MG tablet Take 5 mg by mouth daily.    Allergies:   Augmentin [amoxicillin-pot clavulanate]; Betadine [povidone iodine]; and Clavulanic acid   Social History   Socioeconomic History  . Marital status: Married    Spouse name: Not on file  . Number of children: Not on file  . Years of education: Not on file  . Highest education level: Not on file  Occupational History  . Not on file  Social Needs  . Financial resource strain: Not on file  . Food insecurity:    Worry: Not on file    Inability: Not on file  . Transportation needs:     Medical: Not on file    Non-medical: Not on file  Tobacco Use  . Smoking status: Never Smoker  . Smokeless tobacco: Never Used  Substance and Sexual Activity  . Alcohol use: No  . Drug use: No  . Sexual activity: Not on file  Lifestyle  . Physical activity:    Days per week: Not on file    Minutes per session: Not on file  . Stress: Not on file  Relationships  . Social connections:    Talks on phone: Not on file    Gets together: Not on file    Attends religious service: Not on file    Active member of club or organization: Not on file    Attends meetings of clubs or organizations: Not on file    Relationship status:  Not on file  Other Topics Concern  . Not on file  Social History Narrative  . Not on file    Family History: The patient's family history includes ADD / ADHD in his son and son; COPD in his father; Clotting disorder in his mother; Diabetes in his brother; Heart attack in his brother; Heart disease in his mother; Hypercholesterolemia in his mother; Hypertension in his father and mother; Myelodysplastic syndrome in his mother; Obesity in his brother; Psoriasis in his brother.   ROS:   Please see the history of present illness.    All other systems reviewed and are negative.  EKGs/Labs/Other Studies Reviewed:    The following studies were reviewed today:  EKG:  EKG is not ordered today.  The ekg from the hospital shows normal sinus rhythm with frequent PVCs in a short run of nonsustained VT of 4 beats.  Recent Labs: 06/17/2017: TSH 3.164 06/18/2017: Hemoglobin 14.0; Platelets 221 07/17/2017: BUN 19; Creatinine, Ser 0.99; Potassium 4.6; Sodium 143  Recent Lipid Panel    Component Value Date/Time   CHOL 112 07/17/2017 0753   TRIG 54 07/17/2017 0753   HDL 52 07/17/2017 0753   CHOLHDL 2.2 07/17/2017 0753   CHOLHDL 3.1 06/18/2017 0459   VLDL 6 06/18/2017 0459   LDLCALC 49 07/17/2017 0753    Physical Exam:    VS:  BP 120/68   Pulse (!) 50   Ht 5' 3.5"  (1.613 m)   Wt 155 lb 3.2 oz (70.4 kg)   SpO2 98%   BMI 27.06 kg/m     Wt Readings from Last 3 Encounters:  12/10/17 155 lb 3.2 oz (70.4 kg)  07/16/17 152 lb (68.9 kg)  06/18/17 152 lb 5.4 oz (69.1 kg)     GEN:  Well nourished, well developed in no acute distress HEENT: Normal NECK: No JVD; No carotid bruits LYMPHATICS: No lymphadenopathy CARDIAC: RRR, 2/6 systolic and diastolic murmurs, rubs, gallops RESPIRATORY:  Clear to auscultation without rales, wheezing or rhonchi  ABDOMEN: Soft, non-tender, non-distended MUSCULOSKELETAL:  No edema; No deformity  SKIN: Warm and dry NEUROLOGIC:  Alert and oriented x 3 PSYCHIATRIC:  Normal affect   ASSESSMENT:    1. Precordial chest pain   2. Coronary artery disease involving native coronary artery of native heart without angina pectoris   3. Coronary artery calcification   4. Statin intolerance   5. Palpitations   6. Hyperlipidemia, unspecified hyperlipidemia type   7. Essential hypertension    PLAN:    In order of problems listed above:  1. Chest pain with some typical and atypical features, 2 vessel coronary artery calcification on chest CT, we will obtain coronary CT with CT FFR or further evaluation to rule out hemodynamically significant lesions. 2. Significant statin intolerance will discontinue Crestor, we will refer to her lipid clinic for consideration of PC-SK 9 inhibitors. 3. Hypertension - controlled, we will discontinue metoprolol as he is bradycardic and is on very low-dose. Hyperlipidemia - elevated LDL despite a very strict diet with LDL of 137, he has severe symptoms of diarrhea, constipation, flatulence and epigastric pain associated with statins. We'll refer to lipid clinic for PC SK 9 inhibitors.   Medication Adjustments/Labs and Tests Ordered: Current medicines are reviewed at length with the patient today.  Concerns regarding medicines are outlined above.  Orders Placed This Encounter  Procedures  . CT  CORONARY MORPH W/CTA COR W/SCORE W/CA W/CM &/OR WO/CM  . CT CORONARY FRACTIONAL FLOW RESERVE DATA PREP  .  CT CORONARY FRACTIONAL FLOW RESERVE FLUID ANALYSIS   No orders of the defined types were placed in this encounter.   Signed, Ena Dawley, MD  12/10/2017 5:13 PM    [place]

## 2017-12-18 ENCOUNTER — Ambulatory Visit (INDEPENDENT_AMBULATORY_CARE_PROVIDER_SITE_OTHER): Payer: BLUE CROSS/BLUE SHIELD | Admitting: Pharmacist

## 2017-12-18 DIAGNOSIS — E785 Hyperlipidemia, unspecified: Secondary | ICD-10-CM | POA: Insufficient documentation

## 2017-12-18 DIAGNOSIS — I2510 Atherosclerotic heart disease of native coronary artery without angina pectoris: Secondary | ICD-10-CM | POA: Diagnosis not present

## 2017-12-18 DIAGNOSIS — I2584 Coronary atherosclerosis due to calcified coronary lesion: Secondary | ICD-10-CM

## 2017-12-18 DIAGNOSIS — E782 Mixed hyperlipidemia: Secondary | ICD-10-CM

## 2017-12-18 NOTE — Progress Notes (Signed)
Patient ID: Eric Rhodes                 DOB: Jul 23, 1956                    MRN: [id number]     HPI: Eric Rhodes App is a 62 y.o. male patient referred to lipid clinic by Dr Meda Coffee. PMH is significant for HTN, HLD, GERD with hx of gastric ulcer, sudden cardiac death in his brother, and abdominal pain ? epigastric pain vs chest pain. Stress test was negative for ischemia and LVEF was normal. Chest CT did show 2 vessel coronary artery calcification, he is pending CT FFR to evaluate for any hemodynamically significant lesions. He continues to have sharp chest pain however this resolved when he discontinued atorvastatin. He was then prescribed rosuvastatin with similar symptoms. Therapy was discontinued at most recent office visit with Dr Meda Coffee on 12/10/17 and pt presents today for further lipid management.  Pt reports his GI symptoms have improved since stopping statin therapy and since his GI doctor in [place] prescribed dicyclomine. Pt likely with underlying IBS, exacerbated by statin therapy. He also reports that his LFTs doubled when he took Lipitor in the past.   He has still been taking low dose metoprolol 12.5mg  BID because he was previously told that his HR should stay < 150 when exercising and he had 1 episode where his HR increased to 160. He has been feeling well on the metoprolol but plans to decrease his dose to 12.5mg  just in the evening.  Current Medications: none Intolerances: atorvastatin 40mg  daily, rosuvastatin 5mg  daily - diarrhea, constipation, flatulence, cramping, epigastric pain Risk Factors: 2 vessel coronary calcifications LDL goal: 100mg /dL  Diet: Likes fish and chicken, vegetables, oatmeal, fruit, no dairy.   Exercise: Avid biker - 40 miles several times a week, goes to the gym and lifts weights for 2 hours on off days.  Family History: ADD / ADHD in his son and son; COPD in his father; Clotting disorder in his mother; Diabetes in his brother; Heart attack in  his brother; Heart disease in his mother; Hypercholesterolemia in his mother; Hypertension in his father and mother; Myelodysplastic syndrome in his mother; Obesity in his brother.   Social History: Denies tobacco, alcohol, and illicit drug use.  Labs: 12/08/17: TC 151, TG 51, HDL 57, LDL 84 (on rosuvastatin 5mg  daily) 07/17/17: TC 112, TG 54, HDL 52, LDL 49 (on atorvastatin 40mg  daily) 06/18/17: TC 176, TG 32, HDL 56, LDL 114 (no therapy - improved diet and exercise) 04/23/17: TC 215, TG 87, HDL 61, LDL 137 (baseline on no therapy)   Past Medical History:  Diagnosis Date  . BPH (benign prostatic hyperplasia)   . Chronic abdominal pain   . Chronic insomnia   . GERD (gastroesophageal reflux disease)    diet controlled  . H/O ETOH abuse   . Hernia, inguinal, left   . History of colonoscopy 2004  . History of endoscopy 2004  . Irritable bowel disease   . Prostatitis     Current Outpatient Medications on File Prior to Visit  Medication Sig Dispense Refill  . ALPRAZolam (XANAX) 0.5 MG tablet Take 0.5 mg daily as needed by mouth for anxiety.    . Amino Acids (L-CARNITINE PO) Take 1 tablet by mouth daily.    Marland Kitchen aspirin 81 MG EC tablet Take 81 mg by mouth daily. Swallow whole.    . cetirizine (ZYRTEC) 10 MG tablet Take 10 mg daily  by mouth.    . cholecalciferol (VITAMIN D) 1000 units tablet Take 1,000 Units by mouth daily.    Marland Kitchen co-enzyme Q-10 30 MG capsule Take 30 mg by mouth daily.    Marland Kitchen dexlansoprazole (DEXILANT) 60 MG capsule Take 60 mg by mouth as directed.    . diclofenac sodium (VOLTAREN) 1 % GEL Apply topically as directed.    . fluticasone (FLONASE) 50 MCG/ACT nasal spray Place 2 sprays into both nostrils daily.    Marland Kitchen L-Methylfolate-Algae-B12-B6 (METANX) 3-90.314-2-35 MG CAPS Take 2 (two) times daily by mouth.    . losartan (COZAAR) 25 MG tablet Take 1 tablet (25 mg total) daily by mouth. 90 tablet 3  . Multiple Vitamin (MULTIVITAMIN) tablet Take 1 tablet daily by mouth.    .  nitroGLYCERIN (NITROSTAT) 0.4 MG SL tablet Place 1 tablet (0.4 mg total) under the tongue every 5 (five) minutes x 3 doses as needed for chest pain. 25 tablet 1  . Omega-3 Fatty Acids (FISH OIL PO) Take 1 capsule by mouth 2 (two) times daily.    . pantoprazole (PROTONIX) 40 MG tablet Take 1 tablet (40 mg total) by mouth daily. 30 tablet 3  . sucralfate (CARAFATE) 1 GM/10ML suspension Take 1 g 4 (four) times daily -  with meals and at bedtime by mouth.    . THEANINE PO Take 300 mg daily by mouth.    . therapeutic multivitamin-minerals (THERAGRAN-M) tablet Take 1 tablet by mouth daily.    . vitamin B-12 (CYANOCOBALAMIN) 100 MCG tablet Take 100 mcg by mouth daily.    Marland Kitchen zolpidem (AMBIEN) 5 MG tablet Take 5 mg by mouth at bedtime as needed for sleep.     No current facility-administered medications on file prior to visit.     Allergies  Allergen Reactions  . Augmentin [Amoxicillin-Pot Clavulanate] Other (See Comments)    Causes GI cramps  . Betadine [Povidone Iodine]     Skin sensitive  . Clavulanic Acid     GI Cramps    Assessment/Plan:  1. Hyperlipidemia - Discussed statin rechallenge vs PCSK9i therapy. Lipitor and Crestor have seemed to exacerbate patient's IBS. Patient's GI symptoms have improved since starting dicyclomine, and he is willing to retry low dose Crestor 5mg  3 days per week. He is pending CT FFR which will help to guide lipid targets pending results. Currently targeting LDL goal < 100 and based on baseline LDL 114, Crestor 3 days a week should bring LDL to goal. Advised pt to contact clinic with any adverse events. Will follow up with patient pending CT FFR results if results change LDL goal.   Eric Rhodes, PharmD, CPP, [place] [address] [place], [place] [address] Phone: [phone]; Fax: [phone] 12/18/2017 3:48 PM

## 2017-12-18 NOTE — Patient Instructions (Addendum)
It was nice to meet you today  Restart rosuvastatin 5mg  3 days a week and see if you tolerate therapy better in combination with your dicyclomine  Call Ladana Chavero in the lipid clinic with any side effects or concerns [phone]

## 2018-01-12 ENCOUNTER — Telehealth: Payer: Self-pay | Admitting: *Deleted

## 2018-01-12 DIAGNOSIS — R002 Palpitations: Secondary | ICD-10-CM

## 2018-01-12 DIAGNOSIS — E785 Hyperlipidemia, unspecified: Secondary | ICD-10-CM

## 2018-01-12 MED ORDER — METOPROLOL TARTRATE 25 MG PO TABS: 12.5000 mg | ORAL_TABLET | Freq: Every day | ORAL | 3 refills | Status: DC

## 2018-01-12 MED ORDER — LOSARTAN POTASSIUM 25 MG PO TABS: 25.0000 mg | ORAL_TABLET | Freq: Every day | ORAL | 3 refills | Status: DC

## 2018-01-12 NOTE — Telephone Encounter (Signed)
-----   Message from Dorothy Spark, MD sent at 01/12/2018 10:14 AM EDT ----- Karlene Einstein, Please prescribe - refill this patient metoprolol tartrate 12. 5 mg po QHS and losartan 25 mg po daily. [place] Siren

## 2018-01-12 NOTE — Telephone Encounter (Signed)
Refill request sent to the pts pharmacy of choice listed on his chart.  This refill request was okayed per Dr Meda Coffee.

## 2018-01-14 ENCOUNTER — Other Ambulatory Visit: Payer: Self-pay

## 2018-01-14 ENCOUNTER — Ambulatory Visit (HOSPITAL_COMMUNITY)
Admission: RE | Admit: 2018-01-14 | Discharge: 2018-01-14 | Disposition: A | Discharge status: Home / Self Care | Payer: BLUE CROSS/BLUE SHIELD | Source: Ambulatory Visit | Attending: Cardiology | Admitting: Cardiology

## 2018-01-14 DIAGNOSIS — I2584 Coronary atherosclerosis due to calcified coronary lesion: Secondary | ICD-10-CM

## 2018-01-14 DIAGNOSIS — R072 Precordial pain: Secondary | ICD-10-CM

## 2018-01-14 DIAGNOSIS — R079 Chest pain, unspecified: Secondary | ICD-10-CM | POA: Diagnosis not present

## 2018-01-14 DIAGNOSIS — E785 Hyperlipidemia, unspecified: Secondary | ICD-10-CM

## 2018-01-14 DIAGNOSIS — R002 Palpitations: Secondary | ICD-10-CM

## 2018-01-14 DIAGNOSIS — I2510 Atherosclerotic heart disease of native coronary artery without angina pectoris: Secondary | ICD-10-CM | POA: Insufficient documentation

## 2018-01-14 LAB — POCT I-STAT CREATININE: Creatinine, Ser: <0.2 mg/dL — ABNORMAL LOW (ref 0.61–1.24)

## 2018-01-14 IMAGING — CT CT CORONARY FRACTIONAL FLOW RESERVE
4 of 7 series · 8 of 20 positions shown, 9 images · IV contrast (APPLIED)
Comparison: none

CLINICAL DATA: CAD

EXAM:
FFR-CT
MEDICATIONS:
None
TECHNIQUE: Best systolic and diastolic cardiac CT phases using normal and sharp
kernels sent to [HOSPITAL] for analysis

[Series 6: best diast 69 % · axial · 0.38mm/px · z∈[+1037,+1088]mm · 2 of 382 slices shown, 3 images]
[im 128/382  vessel]
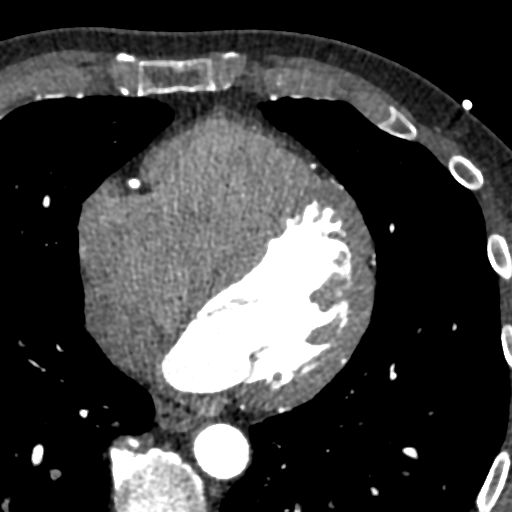
[im 128/382  lung]
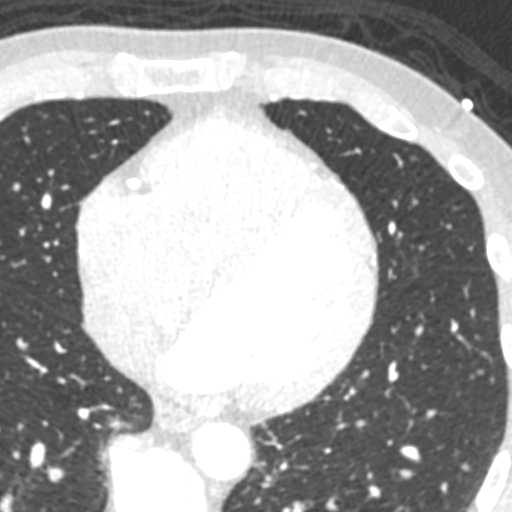
[im 255/382  vessel]
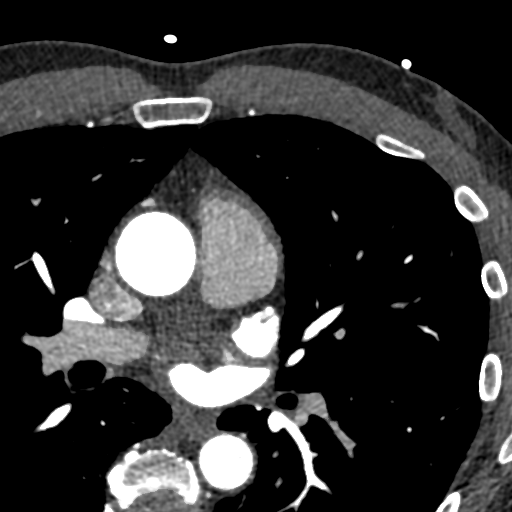

[Series 7: best syst 53 % · axial · 0.38mm/px · z∈[+1037,+1088]mm · 2 of 382 slices shown]
[im 128/382  vessel]
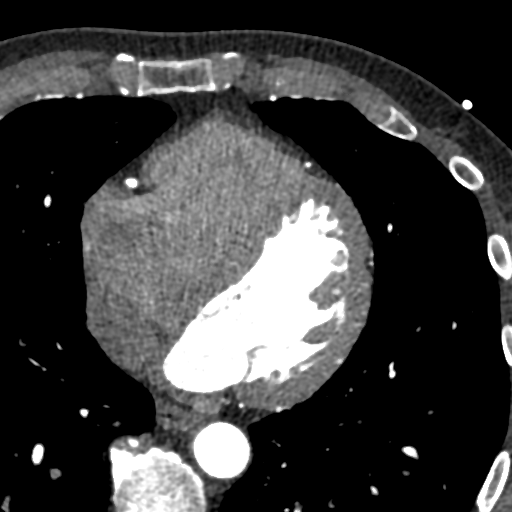
[im 255/382  vessel]
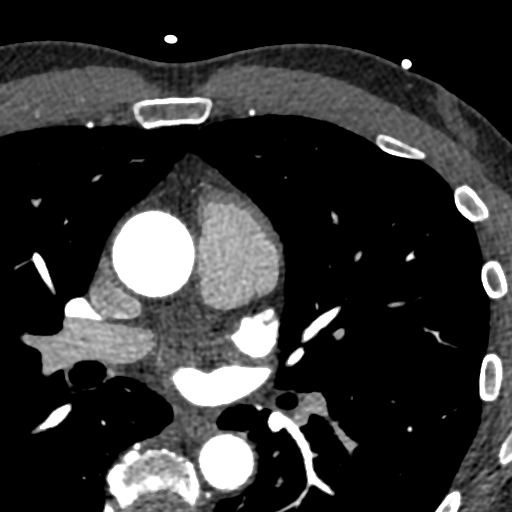

[Series 8: ts diast sharp 69 % · axial · 0.38mm/px · z∈[+1037,+1088]mm · 2 of 382 slices shown]
[im 128/382  lung]
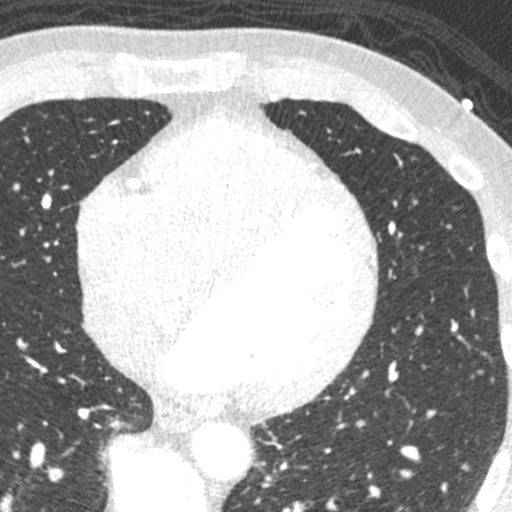
[im 255/382  lung]
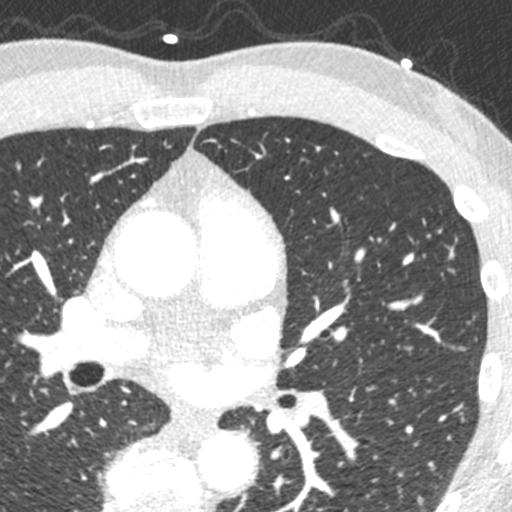

[Series 9: ts syst sharp 53 % · axial · 0.38mm/px · z∈[+1037,+1088]mm · 2 of 382 slices shown]
[im 128/382  lung]
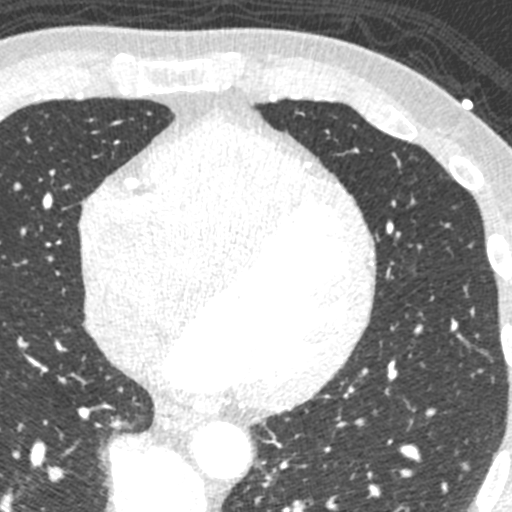
[im 255/382  lung]
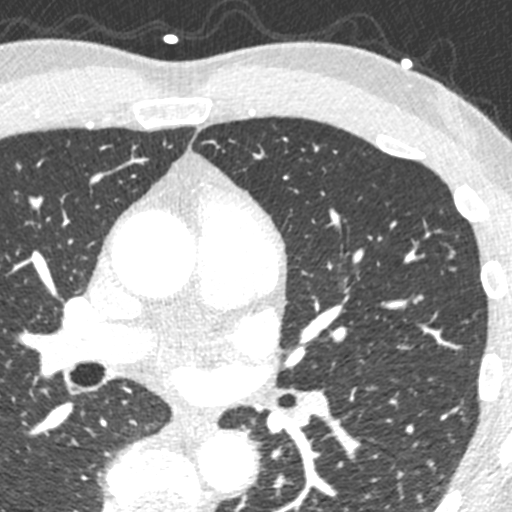

[8 of 20 positions shown; findings below may reference images not displayed]

FINDINGS: Normal FFR-CT

RCA:.98

LAD:.93

D1:.89

Circumflex.96

[W2]
IMPRESSION: Normal FFR-CT would indicate the calcific lesion of concern in the
ostium of the first diagonal not significant

## 2018-01-14 MED ORDER — METOPROLOL TARTRATE 25 MG PO TABS: 12.5000 mg | ORAL_TABLET | Freq: Every day | ORAL | 3 refills | Status: DC

## 2018-01-14 MED ORDER — LOSARTAN POTASSIUM 25 MG PO TABS: 25.0000 mg | ORAL_TABLET | Freq: Every day | ORAL | 3 refills | Status: DC

## 2018-01-14 MED ORDER — METOPROLOL TARTRATE 5 MG/5ML IV SOLN: 5.0000 mg | INTRAVENOUS | Status: DC | PRN

## 2018-01-14 MED ORDER — IOPAMIDOL (ISOVUE-370) INJECTION 76%
INTRAVENOUS | Status: AC
  Administered 2018-01-14: 80 mL via INTRAVENOUS
  Filled 2018-01-14: qty 100

## 2018-01-14 MED ORDER — NITROGLYCERIN 0.4 MG SL SUBL
0.8000 mg | SUBLINGUAL_TABLET | SUBLINGUAL | Status: DC | PRN
  Administered 2018-01-14: 0.8 mg via SUBLINGUAL

## 2018-01-14 MED ORDER — NITROGLYCERIN 0.4 MG SL SUBL
SUBLINGUAL_TABLET | SUBLINGUAL | Status: AC
  Administered 2018-01-14: 0.8 mg via SUBLINGUAL
  Filled 2018-01-14: qty 2

## 2018-01-14 MED ORDER — METOPROLOL TARTRATE 5 MG/5ML IV SOLN
INTRAVENOUS | Status: AC
  Filled 2018-01-14: qty 5

## 2018-01-19 ENCOUNTER — Telehealth: Payer: Self-pay | Admitting: Cardiology

## 2018-01-19 NOTE — Telephone Encounter (Signed)
New Message:    Dr Janus Molder said he was told last week he has V Tach.His question is ,can he continue his intense exercise program?

## 2018-01-19 NOTE — Telephone Encounter (Signed)
Will route to Dr. Meda Coffee to see if pt ok to continue current intense exercise program?

## 2018-01-20 ENCOUNTER — Telehealth: Payer: Self-pay | Admitting: Pharmacist

## 2018-01-20 ENCOUNTER — Encounter: Payer: Self-pay | Admitting: *Deleted

## 2018-01-20 NOTE — Telephone Encounter (Signed)
Left a detailed message on the pts confirmed VM with Dr Francesca Oman recommendations based on his exercise regimen.  Advised the pt to call back with any further questions regarding this.

## 2018-01-20 NOTE — Telephone Encounter (Signed)
70% cardio, 30% weights/strenght, up to HR of 140 - for max 20% of his exercise time

## 2018-01-20 NOTE — Telephone Encounter (Addendum)
Pt aware he does not need lipid visit in person at this time as we are waiting to hear back from his insurance about Repatha coverage. Will f/u with pt once determination is made and can schedule 1st injection in clinic if pt prefers. He prefers to use [facility] pharmacy if his insurance allows.

## 2018-01-20 NOTE — Telephone Encounter (Signed)
Ok.  He wants to know about his exercise regimen. Can he proceed with his current regimen, which is pretty intense, or should he do light exercise or none at all? Please advise!

## 2018-01-20 NOTE — Telephone Encounter (Signed)
Recent Imaging revealed significant coronary calcifications therefore need to intensify cholesterol management and target LDL goal of less than 70. Pt currently tolerating rosuvastatin 5mg  daily. Will submit for PCSK9i therapy in addition to low dose rosuvastatin to bring LDL to goal since has been unable to tolerate higher doses due to GI symptoms.

## 2018-01-20 NOTE — Telephone Encounter (Signed)
Ivy, I called him yesterday, no EP needed for now.

## 2018-01-20 NOTE — Telephone Encounter (Signed)
Dr. Meda Coffee, we referred him to EP for abnormal EKG showing v-tach.   Waiting on EP scheduler to arrange appt. Pt wants you to advise on exercise. Please advise.

## 2018-01-21 DIAGNOSIS — N400 Enlarged prostate without lower urinary tract symptoms: Secondary | ICD-10-CM | POA: Diagnosis not present

## 2018-01-21 DIAGNOSIS — R310 Gross hematuria: Secondary | ICD-10-CM | POA: Diagnosis not present

## 2018-01-22 MED ORDER — EVOLOCUMAB 140 MG/ML ~~LOC~~ SOAJ: 140.0000 mg | SUBCUTANEOUS | 3 refills | Status: DC

## 2018-01-22 NOTE — Telephone Encounter (Signed)
Pt approved for Repatha through insurance. Will send to [facility] per patient request. He is aware to call if [facility] unable to fill. He will also call to set up time to come in to do first injection.

## 2018-01-22 NOTE — Addendum Note (Signed)
Addended by: Erskine Emery on: 01/22/2018 01:37 PM   Modules accepted: Orders

## 2018-02-03 ENCOUNTER — Other Ambulatory Visit: Payer: Self-pay | Admitting: Pharmacist

## 2018-02-03 DIAGNOSIS — E7800 Pure hypercholesterolemia, unspecified: Secondary | ICD-10-CM

## 2018-02-05 ENCOUNTER — Telehealth: Payer: Self-pay | Admitting: Cardiology

## 2018-02-05 NOTE — Telephone Encounter (Signed)
New message   FYI Patient calling to inform staff he is mailing a form from his gym to the office for completion. He is needing clearance to exercise.  No follow up call needed.

## 2018-02-12 ENCOUNTER — Telehealth: Payer: Self-pay | Admitting: Cardiology

## 2018-02-12 NOTE — Telephone Encounter (Signed)
New Message: ° ° ° ° ° ° °Pt is returning a call °

## 2018-02-12 NOTE — Telephone Encounter (Signed)
Spoke with the pt and informed him that Dr Meda Coffee filled out his exercise form to train with a personal trainer, and this is available to pick-up or be mailed.  Pt states he would like this mailed to his confirmed address.  Informed the pt that I will mail this out today.

## 2018-03-11 ENCOUNTER — Encounter: Payer: Self-pay | Admitting: Podiatry

## 2018-03-11 ENCOUNTER — Ambulatory Visit (INDEPENDENT_AMBULATORY_CARE_PROVIDER_SITE_OTHER): Payer: BLUE CROSS/BLUE SHIELD | Admitting: Podiatry

## 2018-03-11 ENCOUNTER — Ambulatory Visit (INDEPENDENT_AMBULATORY_CARE_PROVIDER_SITE_OTHER): Payer: BLUE CROSS/BLUE SHIELD

## 2018-03-11 DIAGNOSIS — M19079 Primary osteoarthritis, unspecified ankle and foot: Secondary | ICD-10-CM | POA: Diagnosis not present

## 2018-03-11 DIAGNOSIS — M779 Enthesopathy, unspecified: Secondary | ICD-10-CM | POA: Diagnosis not present

## 2018-03-12 NOTE — Progress Notes (Signed)
Subjective:   Patient ID: Eric Rhodes, male   DOB: 62 y.o.   MRN: [id number]   HPI 62 year old male presents the office today for concerns of pain to the right first MPJ.  He states that it hurts on top of the joint and he has noticed some swelling to the area.  He states that he gets irritated with hiking and hiking boots.  Has been icing and taking Celebrex which is been helpful.  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  He is hesitant to have an osteotomy performed on the right foot but he wants to consider surgical intervention on the right foot as he is very active.   Review of Systems  All other systems reviewed and are negative.  Past Medical History:  Diagnosis Date  . BPH (benign prostatic hyperplasia)   . Chronic abdominal pain   . Chronic insomnia   . GERD (gastroesophageal reflux disease)    diet controlled  . H/O ETOH abuse   . Hernia, inguinal, left   . History of colonoscopy 2004  . History of endoscopy 2004  . Irritable bowel disease   . Prostatitis     Past Surgical History:  Procedure Laterality Date  . ANTERIOR CRUCIATE LIGAMENT REPAIR Left 1978  . BUNIONECTOMY    . COLON MEDOFF    . COLONOSCOPY    . HEMORRHOIDAL BANDING  10/2014  . HERNIA REPAIR Left 11/2011  . INGUINAL HERNIA REPAIR  11/07/2011   Procedure: HERNIA REPAIR INGUINAL ADULT;  Surgeon: Shann Medal, MD;  Location: [facility];  Service: General;  Laterality: Left;  Left Inguinal Herniorrhaphy with Mesh  . UPPER GASTROINTESTINAL ENDOSCOPY    . WISDOM TOOTH EXTRACTION       Current Outpatient Medications:  .  ALPRAZolam (XANAX) 0.5 MG tablet, Take 0.5 mg daily as needed by mouth for anxiety., Disp: , Rfl:  .  Amino Acids (L-CARNITINE PO), Take 1 tablet by mouth daily., Disp: , Rfl:  .  aspirin 81 MG EC tablet, Take 81 mg by mouth daily. Swallow whole., Disp: , Rfl:   .  cetirizine (ZYRTEC) 10 MG tablet, Take 10 mg daily by mouth., Disp: , Rfl:  .  cholecalciferol (VITAMIN D) 1000 units tablet, Take 1,000 Units by mouth daily., Disp: , Rfl:  .  co-enzyme Q-10 30 MG capsule, Take 30 mg by mouth daily., Disp: , Rfl:  .  dexlansoprazole (DEXILANT) 60 MG capsule, Take 60 mg by mouth as directed., Disp: , Rfl:  .  diclofenac sodium (VOLTAREN) 1 % GEL, Apply topically as directed., Disp: , Rfl:  .  dicyclomine (BENTYL) 10 MG capsule, Take 10 mg by mouth every evening., Disp: , Rfl:  .  Evolocumab (REPATHA SURECLICK) 732 MG/ML SOAJ, Inject 140 mg into the skin every 14 (fourteen) days., Disp: 6 pen, Rfl: 3 .  fluticasone (FLONASE) 50 MCG/ACT nasal spray, Place 2 sprays into both nostrils daily., Disp: , Rfl:  .  L-Methylfolate-Algae-B12-B6 (METANX) 3-90.314-2-35 MG CAPS, Take 2 (two) times daily by mouth., Disp: , Rfl:  .  losartan (COZAAR) 25 MG tablet, Take 1 tablet (25 mg total) by mouth daily., Disp: 90 tablet, Rfl: 3 .  metoprolol tartrate (LOPRESSOR) 25 MG tablet, Take 0.5 tablets (12.5 mg total) by mouth at bedtime., Disp: 45 tablet, Rfl: 3 .  Multiple Vitamin (MULTIVITAMIN) tablet, Take 1 tablet daily by mouth.,  Disp: , Rfl:  .  nitroGLYCERIN (NITROSTAT) 0.4 MG SL tablet, Place 1 tablet (0.4 mg total) under the tongue every 5 (five) minutes x 3 doses as needed for chest pain. (Patient not taking: Reported on 03/11/2018), Disp: 25 tablet, Rfl: 1 .  Omega-3 Fatty Acids (FISH OIL PO), Take 1 capsule by mouth 2 (two) times daily., Disp: , Rfl:  .  pantoprazole (PROTONIX) 40 MG tablet, Take 1 tablet (40 mg total) by mouth daily. (Patient not taking: Reported on 01/14/2018), Disp: 30 tablet, Rfl: 3 .  rosuvastatin (CRESTOR) 5 MG tablet, Take 5 mg by mouth daily., Disp: , Rfl:  .  sucralfate (CARAFATE) 1 GM/10ML suspension, Take 1 g 4 (four) times daily -  with meals and at bedtime by mouth., Disp: , Rfl:  .  THEANINE PO, Take 300 mg daily by mouth., Disp: , Rfl:  .   therapeutic multivitamin-minerals (THERAGRAN-M) tablet, Take 1 tablet by mouth daily., Disp: , Rfl:  .  vitamin B-12 (CYANOCOBALAMIN) 100 MCG tablet, Take 100 mcg by mouth daily., Disp: , Rfl:  .  zolpidem (AMBIEN) 5 MG tablet, Take 5 mg by mouth at bedtime as needed for sleep., Disp: , Rfl:   Allergies  Allergen Reactions  . Augmentin [Amoxicillin-Pot Clavulanate] Other (See Comments)    Causes GI cramps  . Betadine [Povidone Iodine]     Skin sensitive  . Clavulanic Acid     GI Cramps         Objective:  Physical Exam  General: AAO x3, NAD  Dermatological: Skin is warm, dry and supple bilateral. Nails x 10 are well manicured; remaining integument appears unremarkable at this time. There are no open sores, no preulcerative lesions, no rash or signs of infection present.  Vascular: Dorsalis Pedis artery and Posterior Tibial artery pedal pulses are 2/4 bilateral with immedate capillary fill time. There is no pain with calf compression, swelling, warmth, erythema.   Neruologic: Grossly intact via light touch bilateral.  Protective threshold with Semmes Wienstein monofilament intact to all pedal sites bilateral.   Musculoskeletal: The right foot there is a dorsal spur present on the first MPJ.  There is crepitation with first MPJ range of motion however there does appear to be adequate range of motion first MPJ  He has pain in range of motion.  There is mild crepitation of the left first MPJ as well but this without any pain.  Muscular strength 5/5 in all groups tested bilateral.  Gait: Unassisted, Nonantalgic.       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  Arthritic changes present the first MPJ on the right foot.  Dorsal spine is present as well.  No evidence of acute  fracture. -We did discussion regards to both conservative as well as surgical options.  He is very well aware of his options at this point he wants to proceed with surgery.  Discussed options and he does not want to have an osteotomy performed ORIF MPJ fusion or implant.  We discussed a cheilectomy with either an amniotic graft or possibly a Cartiva. He is going to look at the [place] but he seems to be wanting to do an amniotic graft over the metatarsal head.  He wants toto discuss with his wife and schedule at his convience.   Bonna Gains  Jacqualyn Posey DPM

## 2018-03-18 DIAGNOSIS — L821 Other seborrheic keratosis: Secondary | ICD-10-CM | POA: Diagnosis not present

## 2018-03-18 DIAGNOSIS — D220 Melanocytic nevi of lip: Secondary | ICD-10-CM | POA: Diagnosis not present

## 2018-03-24 ENCOUNTER — Telehealth: Payer: Self-pay | Admitting: *Deleted

## 2018-03-24 NOTE — Telephone Encounter (Signed)
"  I want to see if I can move my surgery to a week earlier.  My foot is hurting more and it's making me grumpy."  Yes, Dr. Jacqualyn Posey can do it on August 14.  I'll get it rescheduled.  "I need to reschedule my consult with Catalina Antigua because it's scheduled for August 15."  I'll transfer you to a scheduler.  He can see you on August 12.    I transferred the call to Theo Dills.  I rescheduled the surgery in [facility] from 04/22/2018 to 04/15/2018.

## 2018-03-24 NOTE — Telephone Encounter (Signed)
I rescheduled post op appointments to schedule new date of surgery.

## 2018-03-27 ENCOUNTER — Encounter (INDEPENDENT_AMBULATORY_CARE_PROVIDER_SITE_OTHER): Payer: Self-pay

## 2018-03-27 ENCOUNTER — Other Ambulatory Visit: Payer: BLUE CROSS/BLUE SHIELD | Admitting: *Deleted

## 2018-03-27 DIAGNOSIS — E7800 Pure hypercholesterolemia, unspecified: Secondary | ICD-10-CM | POA: Diagnosis not present

## 2018-03-27 LAB — LIPID PANEL
Chol/HDL Ratio: 1.5 ratio (ref 0.0–5.0)
Cholesterol, Total: 91 mg/dL — ABNORMAL LOW (ref 100–199)
HDL: 62 mg/dL (ref >39)
LDL Calculated: 20 mg/dL (ref 0–99)
Triglycerides: 43 mg/dL (ref 0–149)
VLDL Cholesterol Cal: 9 mg/dL (ref 5–40)

## 2018-03-27 LAB — HEPATIC FUNCTION PANEL
ALT: 26 IU/L (ref 0–44)
AST: 22 IU/L (ref 0–40)
Albumin: 4.3 g/dL (ref 3.6–4.8)
Alkaline Phosphatase: 46 IU/L (ref 39–117)
Bilirubin Total: 1.5 mg/dL — ABNORMAL HIGH (ref 0.0–1.2)
Bilirubin, Direct: 0.29 mg/dL (ref 0.00–0.40)
Total Protein: 6.1 g/dL (ref 6.0–8.5)

## 2018-03-30 ENCOUNTER — Telehealth: Payer: Self-pay | Admitting: *Deleted

## 2018-03-30 NOTE — Telephone Encounter (Signed)
"  I am a patient of Dr. Jacqualyn Posey.  I'm scheduled for surgery on August 14.  We talked about using Celebrex for post-operative pain.  We were going to start a week before.  I want to see if I can get him to do a prescription for me, [place] on [address].  Celebrex 200 mg quantity 61 po bid please let me know if this is possible.  Thank you."

## 2018-03-30 NOTE — Telephone Encounter (Signed)
Pt request to start Celebrex 200mg  #60 one capsule bid prior to surgery.

## 2018-03-31 MED ORDER — CELECOXIB 200 MG PO CAPS
200.0000 mg | ORAL_CAPSULE | Freq: Two times a day (BID) | ORAL | 1 refills | Status: DC
Start: 2018-03-31 — End: 2018-05-01

## 2018-03-31 MED ORDER — CELECOXIB 200 MG PO CAPS: 200.0000 mg | ORAL_CAPSULE | Freq: Two times a day (BID) | ORAL | 1 refills | Status: DC

## 2018-03-31 NOTE — Telephone Encounter (Signed)
Left message informing pt Dr. Jacqualyn Posey ordered the Celebrex for pre-op, sent to [facility].

## 2018-04-08 ENCOUNTER — Telehealth: Payer: Self-pay

## 2018-04-08 NOTE — Telephone Encounter (Signed)
   Chart reviewed.  Patient has non-obstructive CAD noted on Coronary CTA in 12/2017.    RCRI:  0.4%  L/M for patient to call back to assess for any clinical change since last visit with Dr. Ena Dawley in 12/2017.  If no change, he should be able to proceed with surgery.  Richardson Dopp, PA-C    04/08/2018 3:31 PM

## 2018-04-08 NOTE — Telephone Encounter (Signed)
   [facility] Pre-operative Risk Assessment    Request for surgical clearance:  1. What type of surgery is being performed?    - Cheilectomy with graft application to RIGHT foot  2. When is this surgery scheduled?    - 04/15/2018  3. Are there any medications that need to be held prior to surgery and how long?   - Please advise  4. Practice name and name of physician performing surgery?     - [place], DPM  5. What is your office phone and fax number?     - Phone: [phone]   - Fax: [phone]  6. Anesthesia type (None, local, MAC, general) ?    - N/A   Velna Ochs 04/08/2018, 10:29 AM  _________________________________________________________________   (provider comments below)

## 2018-04-08 NOTE — Telephone Encounter (Signed)
Spoke with patient. He is doing well.  No chest pain. He may proceed with surgery at acceptable risk.  Letter faxed to surgeon.  Call back staff: Please make sure letter received by surgeon.  Note will be removed from preop.  Richardson Dopp, PA-C    04/08/2018 4:51 PM

## 2018-04-09 ENCOUNTER — Telehealth: Payer: Self-pay | Admitting: Podiatry

## 2018-04-09 NOTE — Telephone Encounter (Signed)
Spoke with Kathryne Hitch from requesting office and she confirmed they have received clearance

## 2018-04-09 NOTE — Telephone Encounter (Signed)
Called Tyreka back with [facility] and confirmed we received surgical clearance yesterday on Mr. Valade.

## 2018-04-09 NOTE — Telephone Encounter (Addendum)
This is Tyreka calling from [place]. I was calling to see if you had received his surgical clearance form that we faxed over yesterday? If you would, give me a call back at [phone]. We will be doing pre-op until 5 pm and then tomorrow between 8 am and 5 pm if you have to. Thank you.

## 2018-04-13 ENCOUNTER — Ambulatory Visit (INDEPENDENT_AMBULATORY_CARE_PROVIDER_SITE_OTHER): Payer: BLUE CROSS/BLUE SHIELD | Admitting: Podiatry

## 2018-04-13 ENCOUNTER — Encounter: Payer: Self-pay | Admitting: Podiatry

## 2018-04-13 DIAGNOSIS — M19079 Primary osteoarthritis, unspecified ankle and foot: Secondary | ICD-10-CM | POA: Diagnosis not present

## 2018-04-13 DIAGNOSIS — M7751 Other enthesopathy of right foot: Secondary | ICD-10-CM

## 2018-04-13 DIAGNOSIS — M779 Enthesopathy, unspecified: Secondary | ICD-10-CM

## 2018-04-13 NOTE — Patient Instructions (Signed)
Pre-Operative Instructions  Congratulations, you have decided to take an important step towards improving your quality of life.  You can be assured that the doctors and staff at [facility] will be with you every step of the way.  Here are some important things you should know:  1. Plan to be at the surgery center/hospital at least 1 (one) hour prior to your scheduled time, unless otherwise directed by the surgical center/hospital staff.  You must have a responsible adult accompany you, remain during the surgery and drive you home.  Make sure you have directions to the surgical center/hospital to ensure you arrive on time. 2. If you are having surgery at [facility] or [facility] hospitals, you will need a copy of your medical history and physical form from your family physician within one month prior to the date of surgery. We will give you a form for your primary physician to complete.  3. We make every effort to accommodate the date you request for surgery.  However, there are times where surgery dates or times have to be moved.  We will contact you as soon as possible if a change in schedule is required.   4. No aspirin/ibuprofen for one week before surgery.  If you are on aspirin, any non-steroidal anti-inflammatory medications (Mobic, Aleve, Ibuprofen) should not be taken seven (7) days prior to your surgery.  You make take Tylenol for pain prior to surgery.  5. Medications - If you are taking daily heart and blood pressure medications, seizure, reflux, allergy, asthma, anxiety, pain or diabetes medications, make sure you notify the surgery center/hospital before the day of surgery so they can tell you which medications you should take or avoid the day of surgery. 6. No food or drink after midnight the night before surgery unless directed otherwise by surgical center/hospital staff. 7. No alcoholic beverages 24-hours prior to surgery.  No smoking 24-hours prior or 24-hours after  surgery. 8. Wear loose pants or shorts. They should be loose enough to fit over bandages, boots, and casts. 9. Don't wear slip-on shoes. Sneakers are preferred. 10. Bring your boot with you to the surgery center/hospital.  Also bring crutches or a walker if your physician has prescribed it for you.  If you do not have this equipment, it will be provided for you after surgery. 11. If you have not been contacted by the surgery center/hospital by the day before your surgery, call to confirm the date and time of your surgery. 12. Leave-time from work may vary depending on the type of surgery you have.  Appropriate arrangements should be made prior to surgery with your employer. 13. Prescriptions will be provided immediately following surgery by your doctor.  Fill these as soon as possible after surgery and take the medication as directed. Pain medications will not be refilled on weekends and must be approved by the doctor. 14. Remove nail polish on the operative foot and avoid getting pedicures prior to surgery. 15. Wash the night before surgery.  The night before surgery wash the foot and leg well with water and the antibacterial soap provided. Be sure to pay special attention to beneath the toenails and in between the toes.  Wash for at least three (3) minutes. Rinse thoroughly with water and dry well with a towel.  Perform this wash unless told not to do so by your physician.  Enclosed: 1 Ice pack (please put in freezer the night before surgery)   1 Hibiclens skin cleaner     Pre-op instructions  If you have any questions regarding the instructions, please do not hesitate to call our office.  [place]: [address], [place], [place] [address] -- [phone]  [place]: [address] [place], [place] [address] -- [phone]  [place]: [address]  [place], [place] [address] -- [phone]  [place]: [address], [address], [place], [place] [address] -- [phone]  Website:  [email or web address] 

## 2018-04-14 ENCOUNTER — Telehealth: Payer: Self-pay | Admitting: Podiatry

## 2018-04-14 MED ORDER — CEPHALEXIN 500 MG PO CAPS: 500.0000 mg | ORAL_CAPSULE | Freq: Three times a day (TID) | ORAL | 0 refills | Status: DC

## 2018-04-14 MED ORDER — OXYCODONE-ACETAMINOPHEN 5-325 MG PO TABS: 1.0000 | ORAL_TABLET | Freq: Four times a day (QID) | ORAL | 0 refills | Status: DC | PRN

## 2018-04-14 NOTE — Progress Notes (Addendum)
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  Overall the pain may be somewhat better but continues to get pain he is very active and he wants to go and proceed with surgery at this point.  He is taking anti-inflammatories, change shoes, offloading with a significant resolution of the pain.  He denies any recent injury or trauma. No significant swelling or redness he has noticed recently. Denies any systemic complaints such as fevers, chills, nausea, vomiting. No acute changes since last appointment, and no other complaints at this time.   Objective: AAO x3, NAD DP/PT pulses palpable bilaterally, CRT less than 3 seconds Prominent dorsal spurring is present at the right first metatarsophalangeal joint and there is restriction in range of motion and there is tenderness and range of motion.  He is able to dorsiflex the first MPJ to about 15 to 20 degrees.  There is tenderness over the first MPJ.  Minimal crepitation with MPJ range of motion.  There is no other areas of tenderness. No open lesions or pre-ulcerative lesions.  No pain with calf compression, swelling, warmth, erythema  Assessment: 62 year old male hallux limitus right foot  Plan: -All treatment options discussed with the patient including all alternatives, risks, complications.  -Again reviewed the x-rays with him and we discussed with conservative as well as surgical treatment options.  At this point he wished to proceed with surgery.  After discussion regards to options he does not want to have an osteotomy or fusion or implant.  He was to proceed with a cheilectomy with a graft along the first metatarsal phalangeal joint.  We discussed the amniotic graft he wished to proceed. -The incision placement as well as the postoperative course was discussed with the patient. I discussed risks of the surgery which include, but not limited to, infection, bleeding,  pain, swelling, need for further surgery, delayed or nonhealing, painful or ugly scar, numbness or sensation changes, over/under correction, recurrence, transfer lesions, further deformity, hardware failure, DVT/PE, loss of toe/foot. Patient understands these risks and wishes to proceed with surgery. The surgical consent was reviewed with the patient all 3 pages were signed. No promises or guarantees were given to the outcome of the procedure. All questions were answered to the best of my ability. Before the surgery the patient was encouraged to call the office if there is any further questions. The surgery will be performed at the [facility] on an outpatient basis. -CAM boot dispensed for postoperative course -Postop medications sent to [facility]  -Patient encouraged to call the office with any questions, concerns, change in symptoms.   Trula Slade DPM

## 2018-04-14 NOTE — Addendum Note (Signed)
Addended by: Celesta Gentile R on: 04/14/2018 09:00 AM   Modules accepted: Orders

## 2018-04-14 NOTE — Telephone Encounter (Signed)
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 I'm guessing that you sent a prescription in that Dr. Jacqualyn Posey told me about taking antibiotic for surgery, the keflex. I have a quick question if you could please give me a call back at [phone]. Thanks.

## 2018-04-15 ENCOUNTER — Encounter: Payer: Self-pay | Admitting: Podiatry

## 2018-04-15 DIAGNOSIS — M2021 Hallux rigidus, right foot: Secondary | ICD-10-CM

## 2018-04-15 DIAGNOSIS — M25571 Pain in right ankle and joints of right foot: Secondary | ICD-10-CM | POA: Diagnosis not present

## 2018-04-15 DIAGNOSIS — E7800 Pure hypercholesterolemia, unspecified: Secondary | ICD-10-CM | POA: Diagnosis not present

## 2018-04-15 DIAGNOSIS — M779 Enthesopathy, unspecified: Secondary | ICD-10-CM | POA: Diagnosis not present

## 2018-04-15 DIAGNOSIS — M205X1 Other deformities of toe(s) (acquired), right foot: Secondary | ICD-10-CM | POA: Diagnosis not present

## 2018-04-15 NOTE — Telephone Encounter (Signed)
LVM for patient to return my call 

## 2018-04-16 ENCOUNTER — Ambulatory Visit: Payer: BLUE CROSS/BLUE SHIELD | Admitting: Podiatry

## 2018-04-18 ENCOUNTER — Telehealth: Payer: Self-pay | Admitting: *Deleted

## 2018-04-18 NOTE — Telephone Encounter (Signed)
Called patient and patient that he was doing good today and that the first few days the nerve block wore off and could not bend his toes or foot and was using ice and by today the patient stated that he was doing really good and very little pain and could wiggle his toes and move his foot and I stated to the patient to call the [place] office if any concerns or questions and that we would see patient Monday for his appointment. [place] Mon

## 2018-04-20 ENCOUNTER — Ambulatory Visit (INDEPENDENT_AMBULATORY_CARE_PROVIDER_SITE_OTHER): Payer: BLUE CROSS/BLUE SHIELD

## 2018-04-20 ENCOUNTER — Encounter: Payer: BLUE CROSS/BLUE SHIELD | Admitting: Podiatry

## 2018-04-20 ENCOUNTER — Encounter: Payer: Self-pay | Admitting: Podiatry

## 2018-04-20 ENCOUNTER — Ambulatory Visit (INDEPENDENT_AMBULATORY_CARE_PROVIDER_SITE_OTHER): Payer: BLUE CROSS/BLUE SHIELD | Admitting: Podiatry

## 2018-04-20 VITALS — BP 136/81 | HR 65 | Temp 97.4°F

## 2018-04-20 DIAGNOSIS — M19079 Primary osteoarthritis, unspecified ankle and foot: Secondary | ICD-10-CM

## 2018-04-20 DIAGNOSIS — M779 Enthesopathy, unspecified: Secondary | ICD-10-CM | POA: Diagnosis not present

## 2018-04-21 NOTE — Progress Notes (Signed)
Subjective: Eric Rhodes is a 62 y.o. is seen today in office s/p right foot cheilectomy preformed on 04/15/2018.  He states his pain is controlled and his max pain level has been 1/10.  Is internal ice and elevate as much as possible he is taking Celebrex for pain.  He does state that he has put some weight to the foot to try to help with range of motion.  Denies any systemic complaints such as fevers, chills, nausea, vomiting. No calf pain, chest pain, shortness of breath.   Objective: General: No acute distress, AAOx3  DP/PT pulses palpable 2/4, CRT < 3 sec to all digits.  Protective sensation intact. Motor function intact.  Right foot: Incision is well coapted without any evidence of dehiscence and sutures are intact. There is a faint rim of surrounding erythema but this is likely more from inflammation as opposed to infection and there is no drainage or pus there is no ascending cellulitis.  There is no fluctuance, crepitus, malodor, drainage/purulence. There is mild edema around the surgical site. There is no pain along the surgical site. No pain with MPJ ROM.  No other areas of tenderness to bilateral lower extremities.  No other open lesions or pre-ulcerative lesions.  No pain with calf compression, swelling, warmth, erythema.   Assessment and Plan:  Status post right foot surgery, doing well with no complications   -Treatment options discussed including all alternatives, risks, and complications -X-rays were obtained reviewed.  Status post cheilectomy of the first MPJ without any evidence of acute fracture. -Incision appears to be healing well.  Antibiotic ointment was applied followed by a bandage.  Keep the dressing clean, dry, intact. -Ice/elevation -Pain medication as needed. -Monitor for any clinical signs or symptoms of infection and DVT/PE and directed to call the office immediately should any occur or go to the ER. -Follow-up in 10- days for possible suture removal or  sooner if any problems arise. In the meantime, encouraged to call the office with any questions, concerns, change in symptoms.   Celesta Gentile, DPM

## 2018-04-27 ENCOUNTER — Encounter: Payer: BLUE CROSS/BLUE SHIELD | Admitting: Podiatry

## 2018-04-29 DIAGNOSIS — Z1159 Encounter for screening for other viral diseases: Secondary | ICD-10-CM | POA: Diagnosis not present

## 2018-04-29 DIAGNOSIS — Z79899 Other long term (current) drug therapy: Secondary | ICD-10-CM | POA: Diagnosis not present

## 2018-04-29 DIAGNOSIS — Z0000 Encounter for general adult medical examination without abnormal findings: Secondary | ICD-10-CM | POA: Diagnosis not present

## 2018-04-30 ENCOUNTER — Observation Stay (HOSPITAL_COMMUNITY)
Admission: EM | Admit: 2018-04-30 | Discharge: 2018-05-01 | Disposition: A | Discharge status: Home / Self Care | Payer: BLUE CROSS/BLUE SHIELD | Source: Ambulatory Visit | Attending: Internal Medicine | Admitting: Internal Medicine

## 2018-04-30 ENCOUNTER — Other Ambulatory Visit: Payer: Self-pay

## 2018-04-30 ENCOUNTER — Ambulatory Visit (INDEPENDENT_AMBULATORY_CARE_PROVIDER_SITE_OTHER): Payer: Self-pay | Admitting: Podiatry

## 2018-04-30 DIAGNOSIS — F419 Anxiety disorder, unspecified: Secondary | ICD-10-CM | POA: Diagnosis not present

## 2018-04-30 DIAGNOSIS — Z8249 Family history of ischemic heart disease and other diseases of the circulatory system: Secondary | ICD-10-CM | POA: Insufficient documentation

## 2018-04-30 DIAGNOSIS — I2510 Atherosclerotic heart disease of native coronary artery without angina pectoris: Secondary | ICD-10-CM | POA: Diagnosis present

## 2018-04-30 DIAGNOSIS — N400 Enlarged prostate without lower urinary tract symptoms: Secondary | ICD-10-CM | POA: Insufficient documentation

## 2018-04-30 DIAGNOSIS — Z880 Allergy status to penicillin: Secondary | ICD-10-CM | POA: Diagnosis not present

## 2018-04-30 DIAGNOSIS — Z9889 Other specified postprocedural states: Secondary | ICD-10-CM | POA: Diagnosis not present

## 2018-04-30 DIAGNOSIS — R001 Bradycardia, unspecified: Secondary | ICD-10-CM | POA: Insufficient documentation

## 2018-04-30 DIAGNOSIS — Z888 Allergy status to other drugs, medicaments and biological substances status: Secondary | ICD-10-CM | POA: Diagnosis not present

## 2018-04-30 DIAGNOSIS — K589 Irritable bowel syndrome without diarrhea: Secondary | ICD-10-CM | POA: Insufficient documentation

## 2018-04-30 DIAGNOSIS — R079 Chest pain, unspecified: Secondary | ICD-10-CM | POA: Diagnosis not present

## 2018-04-30 DIAGNOSIS — Z7982 Long term (current) use of aspirin: Secondary | ICD-10-CM | POA: Diagnosis not present

## 2018-04-30 DIAGNOSIS — G4700 Insomnia, unspecified: Secondary | ICD-10-CM | POA: Insufficient documentation

## 2018-04-30 DIAGNOSIS — Z7951 Long term (current) use of inhaled steroids: Secondary | ICD-10-CM | POA: Insufficient documentation

## 2018-04-30 DIAGNOSIS — Z8711 Personal history of peptic ulcer disease: Secondary | ICD-10-CM | POA: Diagnosis not present

## 2018-04-30 DIAGNOSIS — M19079 Primary osteoarthritis, unspecified ankle and foot: Secondary | ICD-10-CM

## 2018-04-30 DIAGNOSIS — I10 Essential (primary) hypertension: Principal | ICD-10-CM | POA: Insufficient documentation

## 2018-04-30 DIAGNOSIS — E785 Hyperlipidemia, unspecified: Secondary | ICD-10-CM | POA: Diagnosis not present

## 2018-04-30 DIAGNOSIS — R002 Palpitations: Secondary | ICD-10-CM | POA: Diagnosis not present

## 2018-04-30 DIAGNOSIS — Z79899 Other long term (current) drug therapy: Secondary | ICD-10-CM | POA: Insufficient documentation

## 2018-04-30 DIAGNOSIS — F5104 Psychophysiologic insomnia: Secondary | ICD-10-CM | POA: Diagnosis present

## 2018-04-30 DIAGNOSIS — M779 Enthesopathy, unspecified: Secondary | ICD-10-CM

## 2018-04-30 DIAGNOSIS — K219 Gastro-esophageal reflux disease without esophagitis: Secondary | ICD-10-CM | POA: Diagnosis not present

## 2018-04-30 DIAGNOSIS — R072 Precordial pain: Secondary | ICD-10-CM | POA: Diagnosis present

## 2018-04-30 MED ORDER — MUPIROCIN 2 % EX OINT: 1.0000 "application " | TOPICAL_OINTMENT | Freq: Two times a day (BID) | CUTANEOUS | 2 refills | Status: DC

## 2018-04-30 NOTE — Progress Notes (Signed)
Subjective: Eric Rhodes is a 62 y.o. is seen today in office s/p right foot cheilectomy preformed on 04/15/2018.Marland Kitchen  He states that he has been doing well not have any significant pain.  He is transition to wearing a sandal on his own.  He noticed limited drainage from the incision so has been put his work on the area daily.  Denies any pus.  Some minimal redness but this is been getting better as well.  He has no other concerns today.  Overall he feels well and denies any systemic complaints such as fevers, chills, nausea, vomiting. No calf pain, chest pain, shortness of breath.   Objective: General: No acute distress, AAOx3  DP/PT pulses palpable 2/4, CRT < 3 sec to all digits.  Protective sensation intact. Motor function intact.  Right foot: Incision is well coapted without any evidence of dehiscence and sutures are intact.  There is some mild motion across the middle of the incision there is no drainage or pus identified today there is some faint redness but there is no increase in warmth from there is mild swelling to this area but there is some erythema.  No increased warmth there is no ascending cellulitis.  No fluctuation or crepitation.  There is no malodor. No pain with MPJ range of motion.   No other areas of tenderness to bilateral lower extremities.  No other open lesions or pre-ulcerative lesions.  No pain with calf compression, swelling, warmth, erythema.   Assessment and Plan:  Status post right foot surgery, doing well however with minimal redness and edema  -Treatment options discussed including all alternatives, risks, and complications -Overall he is doing better.  He has been wearing a regular shoe.  I want to get back into at least wearing a surgical shoe for the insertion of completeness will probably need sutures intact for 1 more week.  Sudden for some financial problems.  He wants to resolve his ophthalmologist gave him some of this today.  Continue ice  elevation. -Monitor for any clinical signs or symptoms of infection and DVT/PE and directed to call the office immediately should any occur or go to the ER. -Follow-up in 1 week for possible suture removal or sooner if any problems arise. In the meantime, encouraged to call the office with any questions, concerns, change in symptoms.   Celesta Gentile, DPM

## 2018-04-30 NOTE — ED Triage Notes (Addendum)
Patient c/o nausea, jittery. States that he saw his provider on Wednesday and they discussed stopping his xanax and his [place]. Took 1/2 his dose of xanax tonight and has not taken [place] last night (or tonight). States that he slept poorly last night. Also c/o agitation.

## 2018-05-01 ENCOUNTER — Emergency Department (HOSPITAL_COMMUNITY): Payer: BLUE CROSS/BLUE SHIELD

## 2018-05-01 ENCOUNTER — Encounter (HOSPITAL_COMMUNITY): Payer: Self-pay | Admitting: Family Medicine

## 2018-05-01 DIAGNOSIS — K219 Gastro-esophageal reflux disease without esophagitis: Secondary | ICD-10-CM

## 2018-05-01 DIAGNOSIS — R002 Palpitations: Secondary | ICD-10-CM | POA: Diagnosis present

## 2018-05-01 DIAGNOSIS — F419 Anxiety disorder, unspecified: Secondary | ICD-10-CM | POA: Diagnosis not present

## 2018-05-01 DIAGNOSIS — I10 Essential (primary) hypertension: Secondary | ICD-10-CM | POA: Diagnosis not present

## 2018-05-01 DIAGNOSIS — R079 Chest pain, unspecified: Secondary | ICD-10-CM | POA: Diagnosis not present

## 2018-05-01 DIAGNOSIS — F5104 Psychophysiologic insomnia: Secondary | ICD-10-CM | POA: Diagnosis present

## 2018-05-01 DIAGNOSIS — E782 Mixed hyperlipidemia: Secondary | ICD-10-CM | POA: Diagnosis not present

## 2018-05-01 DIAGNOSIS — G4700 Insomnia, unspecified: Secondary | ICD-10-CM | POA: Diagnosis not present

## 2018-05-01 DIAGNOSIS — E785 Hyperlipidemia, unspecified: Secondary | ICD-10-CM | POA: Diagnosis not present

## 2018-05-01 DIAGNOSIS — I2510 Atherosclerotic heart disease of native coronary artery without angina pectoris: Secondary | ICD-10-CM

## 2018-05-01 LAB — BASIC METABOLIC PANEL
Anion gap: 10 (ref 5–15)
BUN: 14 mg/dL (ref 8–23)
CO2: 28 mmol/L (ref 22–32)
CREATININE: 1.21 mg/dL (ref 0.61–1.24)
Calcium: 9.6 mg/dL (ref 8.9–10.3)
Chloride: 102 mmol/L (ref 98–111)
GFR calc Af Amer: >60 mL/min (ref >60)
GLUCOSE: 145 mg/dL — AB (ref 70–99)
POTASSIUM: 3.9 mmol/L (ref 3.5–5.1)
Sodium: 140 mmol/L (ref 135–145)

## 2018-05-01 LAB — CBC
HEMATOCRIT: 45.7 % (ref 39.0–52.0)
Hemoglobin: 15.1 g/dL (ref 13.0–17.0)
MCH: 30.4 pg (ref 26.0–34.0)
MCHC: 33 g/dL (ref 30.0–36.0)
MCV: 92 fL (ref 78.0–100.0)
PLATELETS: 220 10*3/uL (ref 150–400)
RBC: 4.97 MIL/uL (ref 4.22–5.81)
RDW: 11.9 % (ref 11.5–15.5)
WBC: 8.8 10*3/uL (ref 4.0–10.5)

## 2018-05-01 LAB — MAGNESIUM: Magnesium: 1.9 mg/dL (ref 1.7–2.4)

## 2018-05-01 LAB — TSH: TSH: 3.208 u[IU]/mL (ref 0.350–4.500)

## 2018-05-01 LAB — I-STAT TROPONIN, ED: Troponin i, poc: 0.01 ng/mL (ref 0.00–0.08)

## 2018-05-01 LAB — TROPONIN I
Troponin I: <0.03 ng/mL (ref <0.03)
Troponin I: <0.03 ng/mL (ref <0.03)

## 2018-05-01 IMAGING — DX DG CHEST 2V
2 series · 2 of 2 positions shown · non-contrast
Comparison: [DATE]

CLINICAL DATA: Chest pain, nausea

EXAM:
CHEST - 2 VIEW

[chest pa]
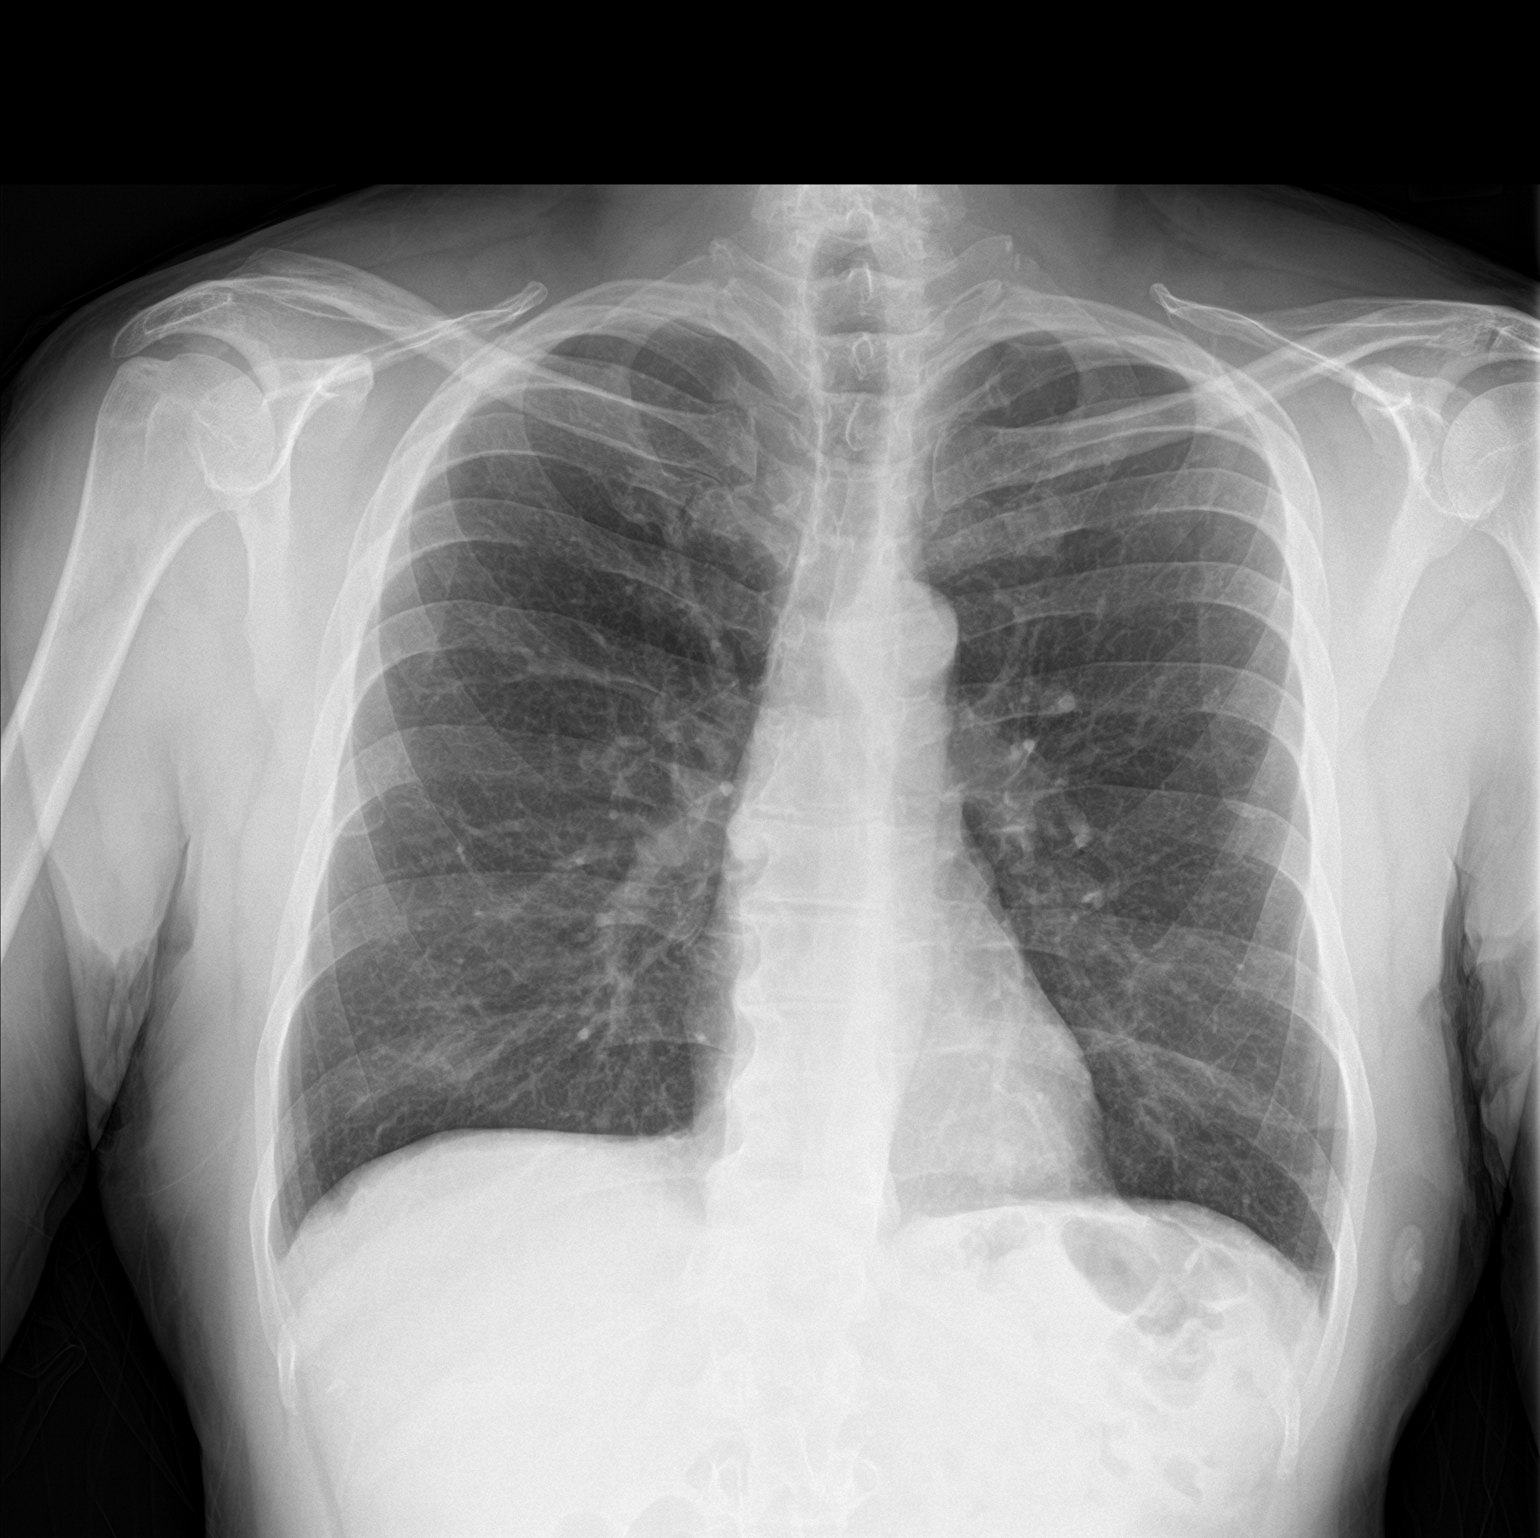

[chest lat]
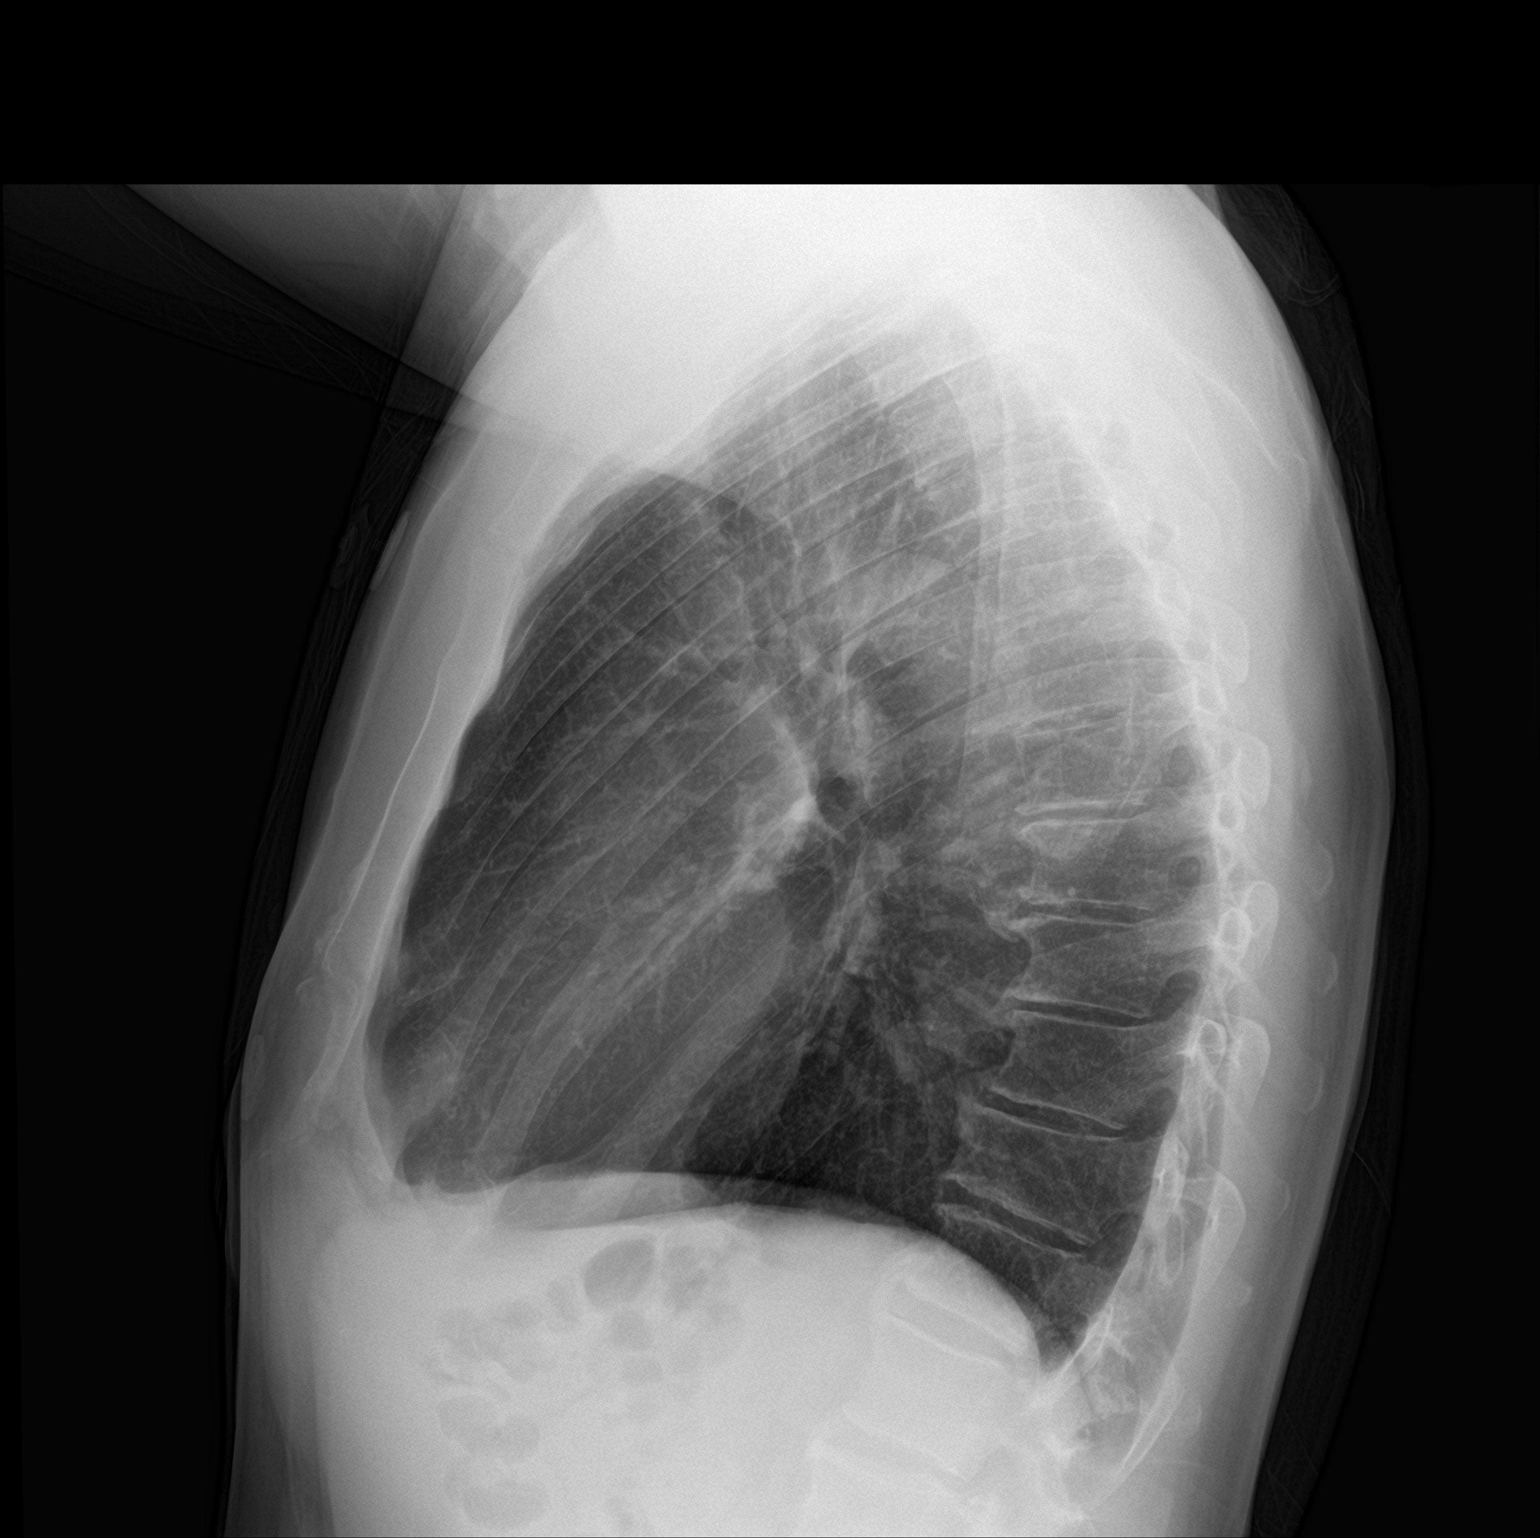

[2 of 2 positions shown; findings below may reference images not displayed]

FINDINGS: Heart and mediastinal contours are within normal limits. No focal
opacities or effusions. No acute bony abnormality.
IMPRESSION: No active cardiopulmonary disease.

## 2018-05-01 MED ORDER — DICLOFENAC SODIUM 1 % TD GEL: 2.0000 g | Freq: Four times a day (QID) | TRANSDERMAL | Status: DC | PRN

## 2018-05-01 MED ORDER — OXYCODONE-ACETAMINOPHEN 5-325 MG PO TABS: 1.0000 | ORAL_TABLET | Freq: Four times a day (QID) | ORAL | Status: DC | PRN

## 2018-05-01 MED ORDER — ASPIRIN EC 81 MG PO TBEC
81.0000 mg | DELAYED_RELEASE_TABLET | Freq: Every day | ORAL | Status: DC
  Administered 2018-05-01: 81 mg via ORAL
  Filled 2018-05-01: qty 1

## 2018-05-01 MED ORDER — SODIUM CHLORIDE 0.9 % IV SOLN: 250.0000 mL | INTRAVENOUS | Status: DC | PRN

## 2018-05-01 MED ORDER — PANTOPRAZOLE SODIUM 40 MG PO TBEC
40.0000 mg | DELAYED_RELEASE_TABLET | Freq: Every day | ORAL | Status: DC
  Filled 2018-05-01: qty 1

## 2018-05-01 MED ORDER — VITAMIN B-12 1000 MCG PO TABS
1000.0000 ug | ORAL_TABLET | Freq: Every day | ORAL | Status: DC
  Administered 2018-05-01: 1000 ug via ORAL
  Filled 2018-05-01: qty 1

## 2018-05-01 MED ORDER — ENOXAPARIN SODIUM 40 MG/0.4ML ~~LOC~~ SOLN: 40.0000 mg | Freq: Every day | SUBCUTANEOUS | Status: DC

## 2018-05-01 MED ORDER — METOPROLOL TARTRATE 25 MG PO TABS: 12.5000 mg | ORAL_TABLET | Freq: Every day | ORAL | Status: DC

## 2018-05-01 MED ORDER — MORPHINE SULFATE (PF) 2 MG/ML IV SOLN: 2.0000 mg | INTRAVENOUS | Status: DC | PRN

## 2018-05-01 MED ORDER — ADULT MULTIVITAMIN W/MINERALS CH
1.0000 | ORAL_TABLET | Freq: Every day | ORAL | Status: DC
  Administered 2018-05-01: 1 via ORAL
  Filled 2018-05-01: qty 1

## 2018-05-01 MED ORDER — ACETAMINOPHEN 325 MG PO TABS: 650.0000 mg | ORAL_TABLET | ORAL | Status: DC | PRN

## 2018-05-01 MED ORDER — ALPRAZOLAM 0.25 MG PO TABS: 0.5000 mg | ORAL_TABLET | Freq: Every day | ORAL | Status: DC | PRN

## 2018-05-01 MED ORDER — ONDANSETRON HCL 4 MG/2ML IJ SOLN: 4.0000 mg | Freq: Four times a day (QID) | INTRAMUSCULAR | Status: DC | PRN

## 2018-05-01 MED ORDER — SUCRALFATE 1 GM/10ML PO SUSP
1.0000 g | Freq: Three times a day (TID) | ORAL | Status: DC
  Filled 2018-05-01: qty 10

## 2018-05-01 MED ORDER — ASPIRIN 300 MG RE SUPP: 300.0000 mg | RECTAL | Status: DC

## 2018-05-01 MED ORDER — OMEGA-3-ACID ETHYL ESTERS 1 G PO CAPS
1.0000 g | ORAL_CAPSULE | Freq: Two times a day (BID) | ORAL | Status: DC
  Administered 2018-05-01: 1 g via ORAL
  Filled 2018-05-01: qty 1

## 2018-05-01 MED ORDER — CELECOXIB 200 MG PO CAPS
200.0000 mg | ORAL_CAPSULE | Freq: Two times a day (BID) | ORAL | Status: DC
  Filled 2018-05-01: qty 1

## 2018-05-01 MED ORDER — LOSARTAN POTASSIUM 25 MG PO TABS
25.0000 mg | ORAL_TABLET | Freq: Every day | ORAL | Status: DC
  Administered 2018-05-01: 25 mg via ORAL
  Filled 2018-05-01: qty 1

## 2018-05-01 MED ORDER — VITAMIN D 1000 UNITS PO TABS
1000.0000 [IU] | ORAL_TABLET | Freq: Every day | ORAL | Status: DC
  Administered 2018-05-01: 1000 [IU] via ORAL
  Filled 2018-05-01: qty 1

## 2018-05-01 MED ORDER — SODIUM CHLORIDE 0.9% FLUSH: 3.0000 mL | INTRAVENOUS | Status: DC | PRN

## 2018-05-01 MED ORDER — ALPRAZOLAM 0.5 MG PO TABS: ORAL_TABLET | ORAL | 0 refills | Status: DC

## 2018-05-01 MED ORDER — NITROGLYCERIN 0.4 MG SL SUBL: 0.4000 mg | SUBLINGUAL_TABLET | SUBLINGUAL | Status: DC | PRN

## 2018-05-01 MED ORDER — ASPIRIN 81 MG PO CHEW: 324.0000 mg | CHEWABLE_TABLET | ORAL | Status: DC

## 2018-05-01 MED ORDER — DICYCLOMINE HCL 10 MG PO CAPS: 10.0000 mg | ORAL_CAPSULE | Freq: Every evening | ORAL | Status: DC

## 2018-05-01 MED ORDER — SODIUM CHLORIDE 0.9% FLUSH
3.0000 mL | Freq: Two times a day (BID) | INTRAVENOUS | Status: DC
  Administered 2018-05-01: 3 mL via INTRAVENOUS

## 2018-05-01 NOTE — Progress Notes (Signed)
patient is alert and oriented up walking in the hallways with no distress.

## 2018-05-01 NOTE — Discharge Instructions (Signed)
Heart palpitations at night occur when you get the feeling of a strong pulse in your chest, neck, or head after you lay down to sleep. Its important to note that while these may be unsettling, theyre usually normal and arent typically a sign of anything more serious.  If you sleep on your side, you may be more susceptible to heart palpitations at night due to the way your body bends and pressure builds up internally. The most common form of palpitation unrelated to your heart occurs when bending over, as theres an increase in abdominal pressure that then transports to your esophagus, which is located behind the left atrium of your heart.  Another factor to consider when experiencing palpitations at night is that they may be occurring throughout the day, but youre only noticing them at night due to lower noise levels and reduced distractions as you lie in bed.

## 2018-05-01 NOTE — Discharge Summary (Signed)
Physician Discharge Summary  Eric Rhodes:[id number] DOB: 1956/02/16 DOA: 04/30/2018  PCP: Kathyrn Lass, MD  Admit date: 04/30/2018 Discharge date: 05/01/2018  Admitted From: home Discharge disposition: home   Recommendations for Outpatient Follow-Up:   Outpatient event monitor Follow up with GI ? Sleep study  Discharge Diagnosis:   Principal Problem:   Precordial chest pain Active Problems:   Hyperlipidemia   Chronic insomnia   Anxiety   Nonobstructive atherosclerosis of coronary artery   Intermittent palpitations   GERD (gastroesophageal reflux disease)   Chest pain    Discharge Condition: Improved.  Diet recommendation: Low sodium, heart healthy  Wound care: None.  Code status: Full.   History of Present Illness:   Eric Rhodes is a 62 y.o. male with medical history significant for irritable bowel syndrome, peptic ulcer disease, nonobstructive CAD, hypertension, hyperlipidemia, and anxiety, now presenting to the emergency department for evaluation of intermittent palpitations and chest discomfort.  Patient reports that he has been trying to taper down and stop his Xanax over the past several days and has developed intermittent palpitations with chest discomfort, mainly at night when trying to sleep.  He has had mild nausea without vomiting or diarrhea associated with this.  He denies shortness of breath, cough, leg swelling, or leg tenderness.  He has had similar symptoms previously, but not for several months at least.  He had coronary CTA in May with nonobstructive CAD.  He has been intolerant to statins and is now on Repatha.   Hospital Course by Problem:   Chest discomfort; palpitations; non-obstructive CAD   - worse with laying down -tele in hospital unrevealing -may need event monitor -? Need for sleep study to r/o OSA  -TSH/Mg normal  GERD; history of PUD  - needs outpatient GI referral   Anxiety  - Has been trying to  taper down and stop Xanax  - plan to wean over 2 weeks   Hyperlipidemia  - Unable to tolerate statin and currently managed with [facility]:      Discharge Exam:   Vitals:   05/01/18 0415 05/01/18 0509  BP: 119/72 105/68  Pulse: (!) 58 61  Resp: 18 18  Temp:  98.1 F (36.7 C)  SpO2: 97% 98%   Vitals:   05/01/18 0230 05/01/18 0330 05/01/18 0415 05/01/18 0509  BP: 134/69 113/70 119/72 105/68  Pulse: 63 (!) 59 (!) 58 61  Resp: 19 17 18 18   Temp:    98.1 F (36.7 C)  TempSrc:    Oral  SpO2: 97% 97% 97% 98%  Weight:      Height:        General exam: Appears calm and comfortable.     The results of significant diagnostics from this hospitalization (including imaging, microbiology, ancillary and laboratory) are listed below for reference.     Procedures and Diagnostic Studies:   Dg Chest 2 View  Result Date: 05/01/2018 CLINICAL DATA:  Chest pain, nausea EXAM: CHEST - 2 VIEW COMPARISON:  06/17/2017 FINDINGS: Heart and mediastinal contours are within normal limits. No focal opacities or effusions. No acute bony abnormality. IMPRESSION: No active cardiopulmonary disease. Electronically Signed   By: Rolm Baptise M.D.   On: 05/01/2018 00:28     Labs:   Basic Metabolic Panel: Recent Labs  Lab 04/30/18 2359 05/01/18 0447  NA 140  --   K 3.9  --   CL 102  --  CO2 28  --   GLUCOSE 145*  --   BUN 14  --   CREATININE 1.21  --   CALCIUM 9.6  --   MG  --  1.9   GFR Estimated Creatinine Clearance: 51.4 mL/min (by C-G formula based on SCr of 1.21 mg/dL). Liver Function Tests: No results for input(s): AST, ALT, ALKPHOS, BILITOT, PROT, ALBUMIN in the last 168 hours. No results for input(s): LIPASE, AMYLASE in the last 168 hours. No results for input(s): AMMONIA in the last 168 hours. Coagulation profile No results for input(s): INR, PROTIME in the last 168 hours.  CBC: Recent Labs  Lab 04/30/18 2359  WBC 8.8  HGB 15.1  HCT 45.7  MCV  92.0  PLT 220   Cardiac Enzymes: Recent Labs  Lab 05/01/18 0447 05/01/18 1012  TROPONINI <0.03 <0.03   BNP: Invalid input(s): POCBNP CBG: No results for input(s): GLUCAP in the last 168 hours. D-Dimer No results for input(s): DDIMER in the last 72 hours. Hgb A1c No results for input(s): HGBA1C in the last 72 hours. Lipid Profile No results for input(s): CHOL, HDL, LDLCALC, TRIG, CHOLHDL, LDLDIRECT in the last 72 hours. Thyroid function studies Recent Labs    05/01/18 0447  TSH 3.208   Anemia work up No results for input(s): VITAMINB12, FOLATE, FERRITIN, TIBC, IRON, RETICCTPCT in the last 72 hours. Microbiology No results found for this or any previous visit (from the past 240 hour(s)).   Discharge Instructions:   Discharge Instructions    Diet - low sodium heart healthy   Complete by:  As directed    Discharge instructions   Complete by:  As directed    Would taper xanax from 0.5 mg to 0.25 mg for 2 weeks then stop   Increase activity slowly   Complete by:  As directed      Allergies as of 05/01/2018      Reactions   Augmentin [amoxicillin-pot Clavulanate] Other (See Comments)   Causes GI cramps   Betadine [povidone Iodine]    Skin sensitive   Clavulanic Acid    GI Cramps   Neosporin [neomycin-bacitracin Zn-polymyx] Rash   Contact dermatitis      Medication List    STOP taking these medications   rosuvastatin 5 MG tablet Commonly known as:  CRESTOR     TAKE these medications   ALPRAZolam 0.5 MG tablet Commonly known as:  XANAX Would continue the 0.25 mg dose daily as needed for 2 weeks then stop What changed:    how much to take  how to take this  when to take this  reasons to take this  additional instructions   aspirin 81 MG EC tablet Take 81 mg by mouth daily. Swallow whole.   cetirizine 10 MG tablet Commonly known as:  ZYRTEC Take 10 mg by mouth daily as needed for allergies.   cholecalciferol 1000 units tablet Commonly known  as:  VITAMIN D Take 1,000 Units by mouth daily.   co-enzyme Q-10 30 MG capsule Take 30 mg by mouth daily.   Dexlansoprazole 30 MG capsule Take 30 mg by mouth 2 (two) times daily.   Evolocumab 140 MG/ML Soaj Inject 140 mg into the skin every 14 (fourteen) days.   FISH OIL PO Take 1 capsule by mouth 2 (two) times daily.   losartan 25 MG tablet Commonly known as:  COZAAR Take 1 tablet (25 mg total) by mouth daily.   METANX 3-90.314-2-35 MG Caps Take 1 capsule by mouth 2 (  two) times daily.   metoprolol tartrate 25 MG tablet Commonly known as:  LOPRESSOR Take 0.5 tablets (12.5 mg total) by mouth at bedtime.   multivitamin tablet Take 1 tablet daily by mouth.   mupirocin ointment 2 % Commonly known as:  BACTROBAN Apply 1 application topically 2 (two) times daily.   nitroGLYCERIN 0.4 MG SL tablet Commonly known as:  NITROSTAT Place 1 tablet (0.4 mg total) under the tongue every 5 (five) minutes x 3 doses as needed for chest pain.   tacrolimus 0.1 % ointment Commonly known as:  PROTOPIC Apply 1 application topically daily as needed. For excema   traZODone 50 MG tablet Commonly known as:  DESYREL Take 50 mg by mouth at bedtime.   vitamin B-12 100 MCG tablet Commonly known as:  CYANOCOBALAMIN Take 100 mcg by mouth daily.      Follow-up Information    Kathyrn Lass, MD Follow up.   Specialty:  Family Medicine Why:  for referral back to Dr. Meda Coffee for 30 day event monitor Contact information: [place] [address] [phone]        can follow up with [facility] for GI issues.            Time coordinating discharge: 35 min  Signed:  Geradine Girt  [facility] 05/01/2018, 4:22 PM

## 2018-05-01 NOTE — ED Provider Notes (Signed)
[facility] Provider Note   CSN: [id number] Arrival date & time: 04/30/18  2325     History   Chief Complaint Chief Complaint  Patient presents with  . Palpitations    HPI Eric Rhodes is a 62 y.o. male.  The history is provided by the patient and the spouse.  Palpitations   This is a new problem. The current episode started more than 2 days ago. The problem occurs daily. The problem has been gradually worsening. Associated symptoms include chest pain, nausea and shortness of breath. Pertinent negatives include no fever, no syncope and no vomiting.  Pt with History of hypertension, hyperlipidemia.  Patient presents with recent increase in palpitations and chest pounding/pain.  He reports it seems to be getting worse at night.  He does report a recent change in his sleep medications including tapering down Xanax as well as substituting Ambien for trazodone Feels that this may be exacerbating his symptoms.  Syncope.  He reports nausea, but no vomiting.  Past Medical History:  Diagnosis Date  . BPH (benign prostatic hyperplasia)   . Chronic abdominal pain   . Chronic insomnia   . GERD (gastroesophageal reflux disease)    diet controlled  . H/O ETOH abuse   . Hernia, inguinal, left   . History of colonoscopy 2004  . History of endoscopy 2004  . Irritable bowel disease   . Prostatitis     Patient Active Problem List   Diagnosis Date Noted  . Chronic insomnia 05/01/2018  . Anxiety 05/01/2018  . Nonobstructive atherosclerosis of coronary artery 05/01/2018  . Intermittent palpitations 05/01/2018  . GERD (gastroesophageal reflux disease) 05/01/2018  . Chest pain 05/01/2018  . Hyperlipidemia 12/18/2017  . Coronary artery calcification 12/18/2017  . Precordial chest pain 06/18/2017  . Inguinal hernia, left 09/27/2011    Past Surgical History:  Procedure Laterality Date  . ANTERIOR CRUCIATE LIGAMENT REPAIR Left 1978  . BUNIONECTOMY    .  COLON MEDOFF    . COLONOSCOPY    . HEMORRHOIDAL BANDING  10/2014  . HERNIA REPAIR Left 11/2011  . INGUINAL HERNIA REPAIR  11/07/2011   Procedure: HERNIA REPAIR INGUINAL ADULT;  Surgeon: Shann Medal, MD;  Location: [place] [facility];  Service: General;  Laterality: Left;  Left Inguinal Herniorrhaphy with Mesh  . UPPER GASTROINTESTINAL ENDOSCOPY    . WISDOM TOOTH EXTRACTION          Home Medications    Prior to Admission medications   Medication Sig Start Date End Date Taking? Authorizing Provider  ALPRAZolam Duanne Moron) 0.5 MG tablet Take 0.5 mg daily as needed by mouth for anxiety.    [provider]  Amino Acids (L-CARNITINE PO) Take 1 tablet by mouth daily.    [provider]  aspirin 81 MG EC tablet Take 81 mg by mouth daily. Swallow whole.    [provider]  celecoxib (CELEBREX) 200 MG capsule Take 1 capsule (200 mg total) by mouth 2 (two) times daily. 03/31/18   Trula Slade, DPM  cephALEXin (KEFLEX) 500 MG capsule Take 1 capsule (500 mg total) by mouth 3 (three) times daily. 04/14/18   Trula Slade, DPM  cetirizine (ZYRTEC) 10 MG tablet Take 10 mg daily by mouth.    [provider]  cholecalciferol (VITAMIN D) 1000 units tablet Take 1,000 Units by mouth daily.    [provider]  co-enzyme Q-10 30 MG capsule Take 30 mg by mouth daily.  [provider]  dexlansoprazole (DEXILANT) 60 MG capsule Take 60 mg by mouth as directed.    [provider]  diclofenac sodium (VOLTAREN) 1 % GEL Apply topically as directed.    [provider]  dicyclomine (BENTYL) 10 MG capsule Take 10 mg by mouth every evening.    [provider]  Evolocumab (REPATHA SURECLICK) 147 MG/ML SOAJ Inject 140 mg into the skin every 14 (fourteen) days. 01/22/18   Dorothy Spark, MD  fluticasone (FLONASE) 50 MCG/ACT nasal spray Place 2 sprays into both nostrils daily.    [provider]    L-Methylfolate-Algae-B12-B6 Glade Stanford) 3-90.314-2-35 MG CAPS Take 2 (two) times daily by mouth.    [provider]  losartan (COZAAR) 25 MG tablet Take 1 tablet (25 mg total) by mouth daily. 01/14/18   Dorothy Spark, MD  metoprolol tartrate (LOPRESSOR) 25 MG tablet Take 0.5 tablets (12.5 mg total) by mouth at bedtime. 01/14/18 04/14/18  Dorothy Spark, MD  Multiple Vitamin (MULTIVITAMIN) tablet Take 1 tablet daily by mouth.    [provider]  mupirocin ointment (BACTROBAN) 2 % Apply 1 application topically 2 (two) times daily. 04/30/18   Trula Slade, DPM  nitroGLYCERIN (NITROSTAT) 0.4 MG SL tablet Place 1 tablet (0.4 mg total) under the tongue every 5 (five) minutes x 3 doses as needed for chest pain. Patient not taking: Reported on 03/11/2018 06/18/17   Dixie Dials, MD  Omega-3 Fatty Acids (FISH OIL PO) Take 1 capsule by mouth 2 (two) times daily.    [provider]  oxyCODONE-acetaminophen (PERCOCET/ROXICET) 5-325 MG tablet Take 1-2 tablets by mouth every 6 (six) hours as needed for severe pain. 04/14/18   Trula Slade, DPM  pantoprazole (PROTONIX) 40 MG tablet Take 1 tablet (40 mg total) by mouth daily. Patient not taking: Reported on 01/14/2018 06/18/17   Dixie Dials, MD  rosuvastatin (CRESTOR) 5 MG tablet Take 5 mg by mouth daily.    [provider]  sucralfate (CARAFATE) 1 GM/10ML suspension Take 1 g 4 (four) times daily -  with meals and at bedtime by mouth.    [provider]  THEANINE PO Take 300 mg daily by mouth.    [provider]  therapeutic multivitamin-minerals (THERAGRAN-M) tablet Take 1 tablet by mouth daily.    [provider]  vitamin B-12 (CYANOCOBALAMIN) 100 MCG tablet Take 100 mcg by mouth daily.    [provider]  zolpidem (AMBIEN) 5 MG tablet Take 5 mg by mouth at bedtime as needed for sleep.    [provider]    Family History Family History  Problem Relation Age of  Onset  . Heart disease Mother        Energy manager  . Myelodysplastic syndrome Mother        chronic  . Hypertension Mother   . Clotting disorder Mother   . Hypercholesterolemia Mother   . COPD Father        respiratory failure  . Hypertension Father   . Diabetes Brother   . Psoriasis Brother   . Obesity Brother   . Heart attack Brother   . ADD / ADHD Son   . ADD / ADHD Son     Social History Social History   Tobacco Use  . Smoking status: Never Smoker  . Smokeless tobacco: Never Used  Substance Use Topics  . Alcohol use: No  . Drug use: No     Allergies   Augmentin [amoxicillin-pot clavulanate]; Betadine [  povidone iodine]; Clavulanic acid; and Neosporin [neomycin-bacitracin zn-polymyx]   Review of Systems Review of Systems  Constitutional: Negative for fever.  Respiratory: Positive for shortness of breath.   Cardiovascular: Positive for chest pain and palpitations. Negative for syncope.  Gastrointestinal: Positive for diarrhea and nausea. Negative for vomiting.  Neurological: Negative for syncope.  Psychiatric/Behavioral: Positive for sleep disturbance.  All other systems reviewed and are negative.    Physical Exam Updated Vital Signs BP 105/68   Pulse 61   Temp 98.1 F (36.7 C) (Oral)   Resp 18   Ht 1.613 m (5' 3.5")   Wt 65.8 kg   SpO2 98%   BMI 25.28 kg/m   Physical Exam CONSTITUTIONAL: Well developed/well nourished HEAD: Normocephalic/atraumatic EYES: EOMI/PERRL ENMT: Mucous membranes moist NECK: supple no meningeal signs SPINE/BACK:entire spine nontender CV: S1/S2 noted, no murmurs/rubs/gallops noted LUNGS: Lungs are clear to auscultation bilaterally, no apparent distress ABDOMEN: soft, nontender, no rebound or guarding, bowel sounds noted throughout abdomen GU:no cva tenderness NEURO: Pt is awake/alert/appropriate, moves all extremitiesx4.  No facial droop.   EXTREMITIES: pulses normal/equal, full ROM, no calf tenderness or  edema SKIN: warm, color normal PSYCH: no abnormalities of mood noted, alert and oriented to situation   ED Treatments / Results  Labs (all labs ordered are listed, but only abnormal results are displayed) Labs Reviewed  BASIC METABOLIC PANEL - Abnormal; Notable for the following components:      Result Value   Glucose, Bld 145 (*)    All other components within normal limits  CBC  TROPONIN I  TSH  MAGNESIUM  TROPONIN I  TROPONIN I  I-STAT TROPONIN, ED    EKG EKG Interpretation  Date/Time:  Friday May 01 2018 00:09:31 EDT Ventricular Rate:  60 PR Interval:  162 QRS Duration: 96 QT Interval:  404 QTC Calculation: 404 R Axis:   76 Text Interpretation:  Normal sinus rhythm Normal ECG Confirmed by Ripley Fraise [phone]) on 05/01/2018 1:30:41 AM   Radiology Dg Chest 2 View  Result Date: 05/01/2018 CLINICAL DATA:  Chest pain, nausea EXAM: CHEST - 2 VIEW COMPARISON:  06/17/2017 FINDINGS: Heart and mediastinal contours are within normal limits. No focal opacities or effusions. No acute bony abnormality. IMPRESSION: No active cardiopulmonary disease. Electronically Signed   By: Rolm Baptise M.D.   On: 05/01/2018 00:28    Procedures Procedures (including critical care time)  Medications Ordered in ED Medications  aspirin EC tablet 81 mg (has no administration in time range)  celecoxib (CELEBREX) capsule 200 mg (has no administration in time range)  losartan (COZAAR) tablet 25 mg (has no administration in time range)  metoprolol tartrate (LOPRESSOR) tablet 12.5 mg (has no administration in time range)  ALPRAZolam (XANAX) tablet 0.5 mg (has no administration in time range)  pantoprazole (PROTONIX) EC tablet 40 mg (has no administration in time range)  dicyclomine (BENTYL) capsule 10 mg (has no administration in time range)  sucralfate (CARAFATE) 1 GM/10ML suspension 1 g (has no administration in time range)  vitamin B-12 (CYANOCOBALAMIN) tablet 1,000 mcg (has no  administration in time range)  cholecalciferol (VITAMIN D) tablet 1,000 Units (has no administration in time range)  multivitamin with minerals tablet 1 tablet (has no administration in time range)  omega-3 acid ethyl esters (LOVAZA) capsule 1 g (has no administration in time range)  diclofenac sodium (VOLTAREN) 1 % transdermal gel 2 g (has no administration in time range)  aspirin chewable tablet 324 mg (has no administration in time range)  Or  aspirin suppository 300 mg (has no administration in time range)  nitroGLYCERIN (NITROSTAT) SL tablet 0.4 mg (has no administration in time range)  acetaminophen (TYLENOL) tablet 650 mg (has no administration in time range)  ondansetron (ZOFRAN) injection 4 mg (has no administration in time range)  enoxaparin (LOVENOX) injection 40 mg (has no administration in time range)  sodium chloride flush (NS) 0.9 % injection 3 mL (has no administration in time range)  sodium chloride flush (NS) 0.9 % injection 3 mL (has no administration in time range)  0.9 %  sodium chloride infusion (has no administration in time range)  morphine 2 MG/ML injection 2-4 mg (has no administration in time range)     Initial Impression / Assessment and Plan / ED Course  I have reviewed the triage vital signs and the nursing notes.  Pertinent labs & imaging results that were available during my care of the patient were reviewed by me and considered in my medical decision making (see chart for details).     Patient with increasing history of palpitations and chest pounding.  EKG here and telemetry strips have been reviewed.  No dysrhythmias noted.  However on previous admissions it appears he had runs of nonsustained V. Tach He has some risk factors for ACS, previous CT coronary revealed calcific lesions.  Given his increasing symptoms of palpitations and chest pounding, patient will be admitted.  I suspect this may be exacerbated by the tapering down of the Xanax.  It may  be reasonable to restart his regular dosing of Xanax for now until his palpitations are further explored Discussed with Dr. Myna Hidalgo for admission  Final Clinical Impressions(s) / ED Diagnoses   Final diagnoses:  Palpitations  Chest pain, rule out acute myocardial infarction    ED Discharge Orders    None       Ripley Fraise, MD 05/01/18 [phone]

## 2018-05-01 NOTE — H&P (Signed)
History and Physical    Eric Rhodes CBJ:[id number] DOB: March 13, 1956 DOA: 04/30/2018  PCP: Kathyrn Lass, MD   Patient coming from: Home   Chief Complaint: Intermittent palpitations, chest discomfort, nausea   HPI: Eric Rhodes is a 62 y.o. male with medical history significant for irritable bowel syndrome, peptic ulcer disease, nonobstructive CAD, hypertension, hyperlipidemia, and anxiety, now presenting to the emergency department for evaluation of intermittent palpitations and chest discomfort.  Patient reports that he has been trying to taper down and stop his Xanax over the past several days and has developed intermittent palpitations with chest discomfort, mainly at night when trying to sleep.  He has had mild nausea without vomiting or diarrhea associated with this.  He denies shortness of breath, cough, leg swelling, or leg tenderness.  He has had similar symptoms previously, but not for several months at least.  He had coronary CTA in May with nonobstructive CAD.  He has been intolerant to statins and is now on Repatha.  ED Course: Upon arrival to the ED, patient is found to be afebrile, saturating well on room air, and with vitals otherwise normal.  EKG features normal sinus rhythm, chest x-ray is negative for acute cardiopulmonary disease, and initial troponin is normal.  Chemistry panel and CBC are unremarkable.  Patient remains hemodynamically stable and asymptomatic while in the emergency department and will be observed for ongoing evaluation and management.  Review of Systems:  All other systems reviewed and apart from HPI, are negative.  Past Medical History:  Diagnosis Date  . BPH (benign prostatic hyperplasia)   . Chronic abdominal pain   . Chronic insomnia   . GERD (gastroesophageal reflux disease)    diet controlled  . H/O ETOH abuse   . Hernia, inguinal, left   . History of colonoscopy 2004  . History of endoscopy 2004  . Irritable bowel disease   .  Prostatitis     Past Surgical History:  Procedure Laterality Date  . ANTERIOR CRUCIATE LIGAMENT REPAIR Left 1978  . BUNIONECTOMY    . COLON MEDOFF    . COLONOSCOPY    . HEMORRHOIDAL BANDING  10/2014  . HERNIA REPAIR Left 11/2011  . INGUINAL HERNIA REPAIR  11/07/2011   Procedure: HERNIA REPAIR INGUINAL ADULT;  Surgeon: Shann Medal, MD;  Location: [facility];  Service: General;  Laterality: Left;  Left Inguinal Herniorrhaphy with Mesh  . UPPER GASTROINTESTINAL ENDOSCOPY    . WISDOM TOOTH EXTRACTION       reports that he has never smoked. He has never used smokeless tobacco. He reports that he does not drink alcohol or use drugs.  Allergies  Allergen Reactions  . Augmentin [Amoxicillin-Pot Clavulanate] Other (See Comments)    Causes GI cramps  . Betadine [Povidone Iodine]     Skin sensitive  . Clavulanic Acid     GI Cramps  . Neosporin [Neomycin-Bacitracin Zn-Polymyx] Rash    Contact dermatitis    Family History  Problem Relation Age of Onset  . Heart disease Mother        Energy manager  . Myelodysplastic syndrome Mother        chronic  . Hypertension Mother   . Clotting disorder Mother   . Hypercholesterolemia Mother   . COPD Father        respiratory failure  . Hypertension Father   . Diabetes Brother   . Psoriasis Brother   . Obesity Brother   . Heart attack Brother   . ADD /  ADHD Son   . ADD / ADHD Son      Prior to Admission medications   Medication Sig Start Date End Date Taking? Authorizing Provider  ALPRAZolam Duanne Moron) 0.5 MG tablet Take 0.5 mg daily as needed by mouth for anxiety.    [provider]  Amino Acids (L-CARNITINE PO) Take 1 tablet by mouth daily.    [provider]  aspirin 81 MG EC tablet Take 81 mg by mouth daily. Swallow whole.    [provider]  celecoxib (CELEBREX) 200 MG capsule Take 1 capsule (200 mg total) by mouth 2 (two) times daily. 03/31/18   Trula Slade, DPM  cephALEXin  (KEFLEX) 500 MG capsule Take 1 capsule (500 mg total) by mouth 3 (three) times daily. 04/14/18   Trula Slade, DPM  cetirizine (ZYRTEC) 10 MG tablet Take 10 mg daily by mouth.    [provider]  cholecalciferol (VITAMIN D) 1000 units tablet Take 1,000 Units by mouth daily.    [provider]  co-enzyme Q-10 30 MG capsule Take 30 mg by mouth daily.    [provider]  dexlansoprazole (DEXILANT) 60 MG capsule Take 60 mg by mouth as directed.    [provider]  diclofenac sodium (VOLTAREN) 1 % GEL Apply topically as directed.    [provider]  dicyclomine (BENTYL) 10 MG capsule Take 10 mg by mouth every evening.    [provider]  Evolocumab (REPATHA SURECLICK) 035 MG/ML SOAJ Inject 140 mg into the skin every 14 (fourteen) days. 01/22/18   Dorothy Spark, MD  fluticasone (FLONASE) 50 MCG/ACT nasal spray Place 2 sprays into both nostrils daily.    [provider]  L-Methylfolate-Algae-B12-B6 Glade Stanford) 3-90.314-2-35 MG CAPS Take 2 (two) times daily by mouth.    [provider]  losartan (COZAAR) 25 MG tablet Take 1 tablet (25 mg total) by mouth daily. 01/14/18   Dorothy Spark, MD  metoprolol tartrate (LOPRESSOR) 25 MG tablet Take 0.5 tablets (12.5 mg total) by mouth at bedtime. 01/14/18 04/14/18  Dorothy Spark, MD  Multiple Vitamin (MULTIVITAMIN) tablet Take 1 tablet daily by mouth.    [provider]  mupirocin ointment (BACTROBAN) 2 % Apply 1 application topically 2 (two) times daily. 04/30/18   Trula Slade, DPM  nitroGLYCERIN (NITROSTAT) 0.4 MG SL tablet Place 1 tablet (0.4 mg total) under the tongue every 5 (five) minutes x 3 doses as needed for chest pain. Patient not taking: Reported on 03/11/2018 06/18/17   Dixie Dials, MD  Omega-3 Fatty Acids (FISH OIL PO) Take 1 capsule by mouth 2 (two) times daily.    [provider]  oxyCODONE-acetaminophen (PERCOCET/ROXICET) 5-325 MG tablet Take  1-2 tablets by mouth every 6 (six) hours as needed for severe pain. 04/14/18   Trula Slade, DPM  pantoprazole (PROTONIX) 40 MG tablet Take 1 tablet (40 mg total) by mouth daily. Patient not taking: Reported on 01/14/2018 06/18/17   Dixie Dials, MD  rosuvastatin (CRESTOR) 5 MG tablet Take 5 mg by mouth daily.    [provider]  sucralfate (CARAFATE) 1 GM/10ML suspension Take 1 g 4 (four) times daily -  with meals and at bedtime by mouth.    [provider]  THEANINE PO Take 300 mg daily by mouth.    [provider]  therapeutic multivitamin-minerals (THERAGRAN-M) tablet Take 1 tablet by mouth daily.    [provider]  vitamin B-12 (CYANOCOBALAMIN) 100 MCG tablet Take 100  mcg by mouth daily.    [provider]  zolpidem (AMBIEN) 5 MG tablet Take 5 mg by mouth at bedtime as needed for sleep.    [provider]    Physical Exam: Vitals:   04/30/18 2344 05/01/18 0200 05/01/18 0230 05/01/18 0330  BP:  134/73 134/69 113/70  Pulse:  63 63 (!) 59  Resp:  18 19 17   Temp:      TempSrc:      SpO2:  98% 97% 97%  Weight: 65.8 kg     Height: 5' 3.5" (1.613 m)         Constitutional: NAD, calm  Eyes: PERTLA, lids and conjunctivae normal ENMT: Mucous membranes are moist. Posterior pharynx clear of any exudate or lesions.   Neck: normal, supple, no masses, no thyromegaly Respiratory: clear to auscultation bilaterally, no wheezing, no crackles. Normal respiratory effort.    Cardiovascular: S1 & S2 heard, regular rate and rhythm. No extremity edema.   Abdomen: No distension, no tenderness, soft. Bowel sounds active.  Musculoskeletal: no clubbing / cyanosis. No joint deformity upper and lower extremities.   Skin: no significant rashes, lesions, ulcers. Warm, dry, well-perfused. Neurologic: No facial asymmetry. Gross hearing deficit. Sensation intact. Strength 5/5 in all 4 limbs.  Psychiatric: Alert and oriented x 3. Calm, cooperative.      Labs on Admission: I have personally reviewed following labs and imaging studies  CBC: Recent Labs  Lab 04/30/18 2359  WBC 8.8  HGB 15.1  HCT 45.7  MCV 92.0  PLT 638   Basic Metabolic Panel: Recent Labs  Lab 04/30/18 2359  NA 140  K 3.9  CL 102  CO2 28  GLUCOSE 145*  BUN 14  CREATININE 1.21  CALCIUM 9.6   GFR: Estimated Creatinine Clearance: 51.4 mL/min (by C-G formula based on SCr of 1.21 mg/dL). Liver Function Tests: No results for input(s): AST, ALT, ALKPHOS, BILITOT, PROT, ALBUMIN in the last 168 hours. No results for input(s): LIPASE, AMYLASE in the last 168 hours. No results for input(s): AMMONIA in the last 168 hours. Coagulation Profile: No results for input(s): INR, PROTIME in the last 168 hours. Cardiac Enzymes: No results for input(s): CKTOTAL, CKMB, CKMBINDEX, TROPONINI in the last 168 hours. BNP (last 3 results) No results for input(s): PROBNP in the last 8760 hours. HbA1C: No results for input(s): HGBA1C in the last 72 hours. CBG: No results for input(s): GLUCAP in the last 168 hours. Lipid Profile: No results for input(s): CHOL, HDL, LDLCALC, TRIG, CHOLHDL, LDLDIRECT in the last 72 hours. Thyroid Function Tests: No results for input(s): TSH, T4TOTAL, FREET4, T3FREE, THYROIDAB in the last 72 hours. Anemia Panel: No results for input(s): VITAMINB12, FOLATE, FERRITIN, TIBC, IRON, RETICCTPCT in the last 72 hours. Urine analysis:    Component Value Date/Time   COLORURINE YELLOW 10/22/2007 1555   APPEARANCEUR CLEAR 10/22/2007 1555   LABSPEC 1.005 10/22/2007 1555   PHURINE 6.0 10/22/2007 1555   GLUCOSEU NEGATIVE 10/22/2007 1555   HGBUR NEGATIVE 10/22/2007 [place] 10/22/2007 1555   KETONESUR 15 (A) 10/22/2007 1555   PROTEINUR NEGATIVE 10/22/2007 1555   UROBILINOGEN 0.2 10/22/2007 1555   NITRITE NEGATIVE 10/22/2007 1555   LEUKOCYTESUR  10/22/2007 1555    NEGATIVE MICROSCOPIC NOT DONE ON URINES WITH NEGATIVE PROTEIN,  BLOOD, LEUKOCYTES, NITRITE, OR GLUCOSE <1000 mg/dL.   Sepsis Labs: @LABRCNTIP (procalcitonin:4,lacticidven:4) )No results found for this or any previous visit (from the past 240 hour(s)).   Radiological Exams on Admission: Dg Chest 2  View  Result Date: 05/01/2018 CLINICAL DATA:  Chest pain, nausea EXAM: CHEST - 2 VIEW COMPARISON:  06/17/2017 FINDINGS: Heart and mediastinal contours are within normal limits. No focal opacities or effusions. No acute bony abnormality. IMPRESSION: No active cardiopulmonary disease. Electronically Signed   By: Rolm Baptise M.D.   On: 05/01/2018 00:28    EKG: Independently reviewed. Normal sinus rhythm.   Assessment/Plan  1. Chest discomfort; palpitations; non-obstructive CAD   - Presents with intermittent palpitations and chest discomfort, worse at night  - He has hx of non-obstructive CAD noted on coronary CTA in May  - EKG, CXR, and initial troponin are normal   - DDx includes ACS, transient arrhythmia, PVC's, anxiety in setting of tapering off of Xanax, or GI-related given hx of IBS and PUD - Continue cardiac monitoring, obtain serial troponin measurements, continue ARB, ASA, and beta-blocker as tolerated, continue PPI and Carafate, continue low-dose Xanax, repeat EKG in am, and check mag level and TSH    2. GERD; hitsory of PUD  - Continue PPI and sucralfate    3. Anxiety  - Has been trying to taper down and stop Xanax  - There is concern that the presenting complaints may be related to anxiety and his current low-dose prn Xanax dose will be continued for now   4. Hyperlipidemia  - Unable to tolerate statin and currently managed with Repatha     DVT prophylaxis: Lovenox Code Status: Full  Family Communication: Discussed with patient  Consults called: none Admission status: Observation     Vianne Bulls, MD [facility] Pager [phone]  If 7PM-7AM, please contact night-coverage [email or web address] Password [email or web address]  05/01/2018, 4:05 AM

## 2018-05-07 ENCOUNTER — Encounter: Payer: Self-pay | Admitting: Podiatry

## 2018-05-07 ENCOUNTER — Encounter: Payer: BLUE CROSS/BLUE SHIELD | Admitting: Podiatry

## 2018-05-07 ENCOUNTER — Ambulatory Visit (INDEPENDENT_AMBULATORY_CARE_PROVIDER_SITE_OTHER): Payer: BLUE CROSS/BLUE SHIELD | Admitting: Podiatry

## 2018-05-07 DIAGNOSIS — M19079 Primary osteoarthritis, unspecified ankle and foot: Secondary | ICD-10-CM

## 2018-05-07 MED ORDER — CEPHALEXIN 500 MG PO CAPS: 500.0000 mg | ORAL_CAPSULE | Freq: Three times a day (TID) | ORAL | 0 refills | Status: DC

## 2018-05-08 ENCOUNTER — Encounter: Payer: BLUE CROSS/BLUE SHIELD | Admitting: Podiatry

## 2018-05-08 DIAGNOSIS — Z23 Encounter for immunization: Secondary | ICD-10-CM | POA: Diagnosis not present

## 2018-05-08 DIAGNOSIS — R197 Diarrhea, unspecified: Secondary | ICD-10-CM | POA: Diagnosis not present

## 2018-05-11 NOTE — Progress Notes (Signed)
Subjective: Eric Rhodes is a 62 y.o. is seen today in office s/p right foot cheilectomy preformed on 04/15/2018.  He states that overall he is doing better and has been putting the mupirocin on the incision today.  He will have some very minimal occasional spotting of some drainage on the bandage but overall is doing much better.  He states that the redness that he was having last appointment swelling is also improved he has some occasional burning on the incision but otherwise his pain is controlled and he is doing well.  He is remained in the surgical shoe since I last saw him. Denies any systemic complaints such as fevers, chills, nausea, vomiting. No calf pain, chest pain, shortness of breath.   Objective: General: No acute distress, AAOx3  DP/PT pulses palpable 2/4, CRT < 3 sec to all digits.  Protective sensation intact. Motor function intact.  Right foot: Incision is well coapted without any evidence of dehiscence and sutures are intact.  There is decrease edema on the surgical site there is decreased erythema.  There is no drainage or pus identified today and there is no fluctuation or crepitation is no increase in warmth.  Incision appears to be healing.  No significant discomfort to palpation surgical site.  There is no pain with MPJ range of motion. No other areas of tenderness to bilateral lower extremities.  No other open lesions or pre-ulcerative lesions.  No pain with calf compression, swelling, warmth, erythema.   Assessment and Plan:  Status post right foot surgery, improving  -Treatment options discussed including all alternatives, risks, and complications -Overall he is continued to improve.  I want him to continue the mupirocin ointment dressing changes daily.  Unusual to the sutures today.  I want to continue with surgical shoe.  Continue ice elevate.  Still some minimal swelling although the erythema is improved that she will be cautious in general a week of antibiotics.   Prescribed Keflex. -Monitor for any clinical signs or symptoms of infection and directed to call the office immediately should any occur or go to the ER.  Return in about 1 week (around 05/14/2018).  Eric Rhodes DPM

## 2018-05-14 ENCOUNTER — Encounter: Payer: Self-pay | Admitting: Podiatry

## 2018-05-14 ENCOUNTER — Ambulatory Visit (INDEPENDENT_AMBULATORY_CARE_PROVIDER_SITE_OTHER): Payer: BLUE CROSS/BLUE SHIELD

## 2018-05-14 ENCOUNTER — Ambulatory Visit (INDEPENDENT_AMBULATORY_CARE_PROVIDER_SITE_OTHER): Payer: BLUE CROSS/BLUE SHIELD | Admitting: Podiatry

## 2018-05-14 DIAGNOSIS — M19071 Primary osteoarthritis, right ankle and foot: Secondary | ICD-10-CM

## 2018-05-14 DIAGNOSIS — M19079 Primary osteoarthritis, unspecified ankle and foot: Secondary | ICD-10-CM

## 2018-05-14 NOTE — Progress Notes (Signed)
Subjective: Eric Rhodes is a 62 y.o. is seen today in office s/p right foot cheilectomy preformed on 04/15/2018.  He is doing better. He has been applying mupirocin to the incision daily. There was no drainage today. He states that he feels great. He is still wearing the surgical shoe.  Denies any systemic complaints such as fevers, chills, nausea, vomiting. No calf pain, chest pain, shortness of breath.   Objective: General: No acute distress, AAOx3  DP/PT pulses palpable 2/4, CRT < 3 sec to all digits.  Protective sensation intact. Motor function intact.  Right foot: Incision is well coapted without any evidence of dehiscence and sutures are intact.  There is decrease edema on the surgical site there is no significant erythema.  There is no drainage or pus identified today and there is no fluctuation or crepitation is no increase in warmth or other signs of infection.  Incision appears to be healing.  No significant discomfort to palpation surgical site.  There is no pain with MPJ range of motion. No other areas of tenderness to bilateral lower extremities.  No other open lesions or pre-ulcerative lesions.  No pain with calf compression, swelling, warmth, erythema.   Assessment and Plan:  Status post right foot surgery, improving  -Treatment options discussed including all alternatives, risks, and complications -Include the sutures removed without complications the incision remains well coapted and is doing well.  Silvadene was applied followed by dressing.  Continue mupirocin ointment dressing changes daily.  Remain in the surgical shoe for now but next 3 weeks or to transition to regular shoes as the incision remains healing well.  Start physical therapy next week to work on range of motion.  Continue ice elevate as well as a compression bandage.  Trula Slade DPM

## 2018-05-15 NOTE — Addendum Note (Signed)
Addended by: Cranford Mon R on: 05/15/2018 01:53 PM   Modules accepted: Orders

## 2018-05-18 DIAGNOSIS — M25475 Effusion, left foot: Secondary | ICD-10-CM | POA: Diagnosis not present

## 2018-05-18 DIAGNOSIS — M25674 Stiffness of right foot, not elsewhere classified: Secondary | ICD-10-CM | POA: Diagnosis not present

## 2018-05-18 DIAGNOSIS — M25572 Pain in left ankle and joints of left foot: Secondary | ICD-10-CM | POA: Diagnosis not present

## 2018-05-18 DIAGNOSIS — R269 Unspecified abnormalities of gait and mobility: Secondary | ICD-10-CM | POA: Diagnosis not present

## 2018-05-20 DIAGNOSIS — M25674 Stiffness of right foot, not elsewhere classified: Secondary | ICD-10-CM | POA: Diagnosis not present

## 2018-05-20 DIAGNOSIS — M25475 Effusion, left foot: Secondary | ICD-10-CM | POA: Diagnosis not present

## 2018-05-20 DIAGNOSIS — R269 Unspecified abnormalities of gait and mobility: Secondary | ICD-10-CM | POA: Diagnosis not present

## 2018-05-20 DIAGNOSIS — M25572 Pain in left ankle and joints of left foot: Secondary | ICD-10-CM | POA: Diagnosis not present

## 2018-05-22 DIAGNOSIS — R269 Unspecified abnormalities of gait and mobility: Secondary | ICD-10-CM | POA: Diagnosis not present

## 2018-05-22 DIAGNOSIS — M25674 Stiffness of right foot, not elsewhere classified: Secondary | ICD-10-CM | POA: Diagnosis not present

## 2018-05-22 DIAGNOSIS — M25475 Effusion, left foot: Secondary | ICD-10-CM | POA: Diagnosis not present

## 2018-05-22 DIAGNOSIS — M25572 Pain in left ankle and joints of left foot: Secondary | ICD-10-CM | POA: Diagnosis not present

## 2018-05-25 DIAGNOSIS — R269 Unspecified abnormalities of gait and mobility: Secondary | ICD-10-CM | POA: Diagnosis not present

## 2018-05-25 DIAGNOSIS — M25572 Pain in left ankle and joints of left foot: Secondary | ICD-10-CM | POA: Diagnosis not present

## 2018-05-25 DIAGNOSIS — M25674 Stiffness of right foot, not elsewhere classified: Secondary | ICD-10-CM | POA: Diagnosis not present

## 2018-05-25 DIAGNOSIS — M25475 Effusion, left foot: Secondary | ICD-10-CM | POA: Diagnosis not present

## 2018-05-27 DIAGNOSIS — M25572 Pain in left ankle and joints of left foot: Secondary | ICD-10-CM | POA: Diagnosis not present

## 2018-05-27 DIAGNOSIS — K648 Other hemorrhoids: Secondary | ICD-10-CM | POA: Diagnosis not present

## 2018-05-27 DIAGNOSIS — M25674 Stiffness of right foot, not elsewhere classified: Secondary | ICD-10-CM | POA: Diagnosis not present

## 2018-05-27 DIAGNOSIS — R269 Unspecified abnormalities of gait and mobility: Secondary | ICD-10-CM | POA: Diagnosis not present

## 2018-05-27 DIAGNOSIS — M25475 Effusion, left foot: Secondary | ICD-10-CM | POA: Diagnosis not present

## 2018-05-28 ENCOUNTER — Ambulatory Visit: Payer: BLUE CROSS/BLUE SHIELD

## 2018-05-28 ENCOUNTER — Ambulatory Visit (INDEPENDENT_AMBULATORY_CARE_PROVIDER_SITE_OTHER): Payer: BLUE CROSS/BLUE SHIELD | Admitting: Podiatry

## 2018-05-28 DIAGNOSIS — M19079 Primary osteoarthritis, unspecified ankle and foot: Secondary | ICD-10-CM

## 2018-05-28 DIAGNOSIS — M2011 Hallux valgus (acquired), right foot: Secondary | ICD-10-CM

## 2018-05-28 MED ORDER — MUPIROCIN 2 % EX OINT: 1.0000 "application " | TOPICAL_OINTMENT | Freq: Two times a day (BID) | CUTANEOUS | 2 refills | Status: DC

## 2018-05-28 NOTE — Progress Notes (Signed)
Subjective: Eric Rhodes is a 62 y.o. is seen today in office s/p right foot cheilectomy preformed on 04/15/2018.  He states that he is doing well and PT is going great. He is able to dorsiflex his 1st MTPJ to about 50 degrees. He has been back in shoes for 10 days. He has been cycling every other day and going to the gym as well and working with a trainer. He could use the mupirocin ointment on the wound daily.  He is a very small amount of bloody drainage when he puts the ointment on.  Otherwise he is doing well.  The swelling has also improved.  Denies any redness or red streaks.  Denies any systemic complaints such as fevers, chills, nausea, vomiting. No calf pain, chest pain, shortness of breath.   Objective: General: No acute distress, AAOx3  DP/PT pulses palpable 2/4, CRT < 3 sec to all digits.  Protective sensation intact. Motor function intact.  Right foot: Incision is well coapted without any evidence of dehiscence and scars formed.  Unable to identify any drainage or pus today.  There is decrease edema.  There is no significant tenderness palpation the surgical site and there is no erythema or increase in warmth or any ascending cellulitis.  There is no fluctuation or crepitation or any malodor.  There is no pain with MPJ range of motion. No other open lesions or pre-ulcerative lesions.  No pain with calf compression, swelling, warmth, erythema.   Assessment and Plan:  Status post right foot surgery, improving  -Treatment options discussed including all alternatives, risks, and complications -Include the sutures removed without complications the incision remains well coapted and is doing well.  Mupirocin ointment was applied today followed by a dressing.  Continue physical therapy.  Continue wearing supportive shoes.  Gradual increase in activity level.  Continue ice elevation as well.  Return in about 4 weeks (around 06/25/2018).  Trula Slade DPM

## 2018-05-29 DIAGNOSIS — M25572 Pain in left ankle and joints of left foot: Secondary | ICD-10-CM | POA: Diagnosis not present

## 2018-05-29 DIAGNOSIS — M25475 Effusion, left foot: Secondary | ICD-10-CM | POA: Diagnosis not present

## 2018-05-29 DIAGNOSIS — R269 Unspecified abnormalities of gait and mobility: Secondary | ICD-10-CM | POA: Diagnosis not present

## 2018-05-29 DIAGNOSIS — M25674 Stiffness of right foot, not elsewhere classified: Secondary | ICD-10-CM | POA: Diagnosis not present

## 2018-06-01 DIAGNOSIS — R269 Unspecified abnormalities of gait and mobility: Secondary | ICD-10-CM | POA: Diagnosis not present

## 2018-06-01 DIAGNOSIS — M25674 Stiffness of right foot, not elsewhere classified: Secondary | ICD-10-CM | POA: Diagnosis not present

## 2018-06-01 DIAGNOSIS — M25572 Pain in left ankle and joints of left foot: Secondary | ICD-10-CM | POA: Diagnosis not present

## 2018-06-01 DIAGNOSIS — M25475 Effusion, left foot: Secondary | ICD-10-CM | POA: Diagnosis not present

## 2018-06-03 DIAGNOSIS — M25475 Effusion, left foot: Secondary | ICD-10-CM | POA: Diagnosis not present

## 2018-06-03 DIAGNOSIS — M25572 Pain in left ankle and joints of left foot: Secondary | ICD-10-CM | POA: Diagnosis not present

## 2018-06-03 DIAGNOSIS — R269 Unspecified abnormalities of gait and mobility: Secondary | ICD-10-CM | POA: Diagnosis not present

## 2018-06-03 DIAGNOSIS — M25674 Stiffness of right foot, not elsewhere classified: Secondary | ICD-10-CM | POA: Diagnosis not present

## 2018-06-05 DIAGNOSIS — K648 Other hemorrhoids: Secondary | ICD-10-CM | POA: Diagnosis not present

## 2018-06-08 DIAGNOSIS — M25475 Effusion, left foot: Secondary | ICD-10-CM | POA: Diagnosis not present

## 2018-06-08 DIAGNOSIS — R269 Unspecified abnormalities of gait and mobility: Secondary | ICD-10-CM | POA: Diagnosis not present

## 2018-06-08 DIAGNOSIS — M25674 Stiffness of right foot, not elsewhere classified: Secondary | ICD-10-CM | POA: Diagnosis not present

## 2018-06-08 DIAGNOSIS — M25572 Pain in left ankle and joints of left foot: Secondary | ICD-10-CM | POA: Diagnosis not present

## 2018-06-12 DIAGNOSIS — M25475 Effusion, left foot: Secondary | ICD-10-CM | POA: Diagnosis not present

## 2018-06-12 DIAGNOSIS — R269 Unspecified abnormalities of gait and mobility: Secondary | ICD-10-CM | POA: Diagnosis not present

## 2018-06-12 DIAGNOSIS — M25572 Pain in left ankle and joints of left foot: Secondary | ICD-10-CM | POA: Diagnosis not present

## 2018-06-12 DIAGNOSIS — M25674 Stiffness of right foot, not elsewhere classified: Secondary | ICD-10-CM | POA: Diagnosis not present

## 2018-06-15 DIAGNOSIS — M25674 Stiffness of right foot, not elsewhere classified: Secondary | ICD-10-CM | POA: Diagnosis not present

## 2018-06-15 DIAGNOSIS — M25572 Pain in left ankle and joints of left foot: Secondary | ICD-10-CM | POA: Diagnosis not present

## 2018-06-15 DIAGNOSIS — R269 Unspecified abnormalities of gait and mobility: Secondary | ICD-10-CM | POA: Diagnosis not present

## 2018-06-15 DIAGNOSIS — M25475 Effusion, left foot: Secondary | ICD-10-CM | POA: Diagnosis not present

## 2018-06-17 DIAGNOSIS — M25475 Effusion, left foot: Secondary | ICD-10-CM | POA: Diagnosis not present

## 2018-06-17 DIAGNOSIS — M25572 Pain in left ankle and joints of left foot: Secondary | ICD-10-CM | POA: Diagnosis not present

## 2018-06-17 DIAGNOSIS — R269 Unspecified abnormalities of gait and mobility: Secondary | ICD-10-CM | POA: Diagnosis not present

## 2018-06-17 DIAGNOSIS — M25674 Stiffness of right foot, not elsewhere classified: Secondary | ICD-10-CM | POA: Diagnosis not present

## 2018-06-19 ENCOUNTER — Telehealth: Payer: Self-pay | Admitting: Cardiology

## 2018-06-19 MED ORDER — METOPROLOL TARTRATE 25 MG PO TABS: 12.5000 mg | ORAL_TABLET | Freq: Two times a day (BID) | ORAL | 3 refills | Status: DC

## 2018-06-19 NOTE — Telephone Encounter (Signed)
Metoprolol tatrate 12.5 mg po bid sent to the pts confirmed pharmacy of choice.  Pt aware of med sent.  Pt gracious for all the assistance provided.

## 2018-06-19 NOTE — Telephone Encounter (Signed)
New Message:    It is getting time for him to get his Metoprolol refilled. He says he needs to talk to you about the dose.

## 2018-06-19 NOTE — Telephone Encounter (Signed)
Pt is calling in to refill his metoprolol 12.5 mg.  Pt is currently prescribed this at metoprolol 12.5 mg po daily at bedtime.  Pt states he is an avid runner and biker, and exercises 7 days a week 3 hours at a time.  Pt states on the 4 days he bikes, he takes metoprolol 12.5 mg po in the AM (to get HR in target range), then takes his 12.5 mg po at bedtime (as scheduled). Pts question is if Dr Meda Coffee would be ok changing his metoprolol order to 12.5 mg po bid. He states he will take it only on biking days, but for pharmacy reasons, it would be easier to send it in as metoprolol 12.5 mg po bid.  Endorsed to the pt that this should not be a problem at all, but I will route this request to Dr Meda Coffee to review and advise on requested order, then send this to his pharmacy thereafter. Pt verbalized understanding and agrees with this plan.

## 2018-06-19 NOTE — Telephone Encounter (Signed)
Toprol 12.5 p.o. twice daily is okay with me.

## 2018-06-25 ENCOUNTER — Telehealth: Payer: Self-pay | Admitting: Cardiology

## 2018-06-25 ENCOUNTER — Ambulatory Visit (INDEPENDENT_AMBULATORY_CARE_PROVIDER_SITE_OTHER): Payer: BLUE CROSS/BLUE SHIELD

## 2018-06-25 ENCOUNTER — Ambulatory Visit (INDEPENDENT_AMBULATORY_CARE_PROVIDER_SITE_OTHER): Payer: BLUE CROSS/BLUE SHIELD | Admitting: Podiatry

## 2018-06-25 DIAGNOSIS — M2011 Hallux valgus (acquired), right foot: Secondary | ICD-10-CM

## 2018-06-25 DIAGNOSIS — I700 Atherosclerosis of aorta: Secondary | ICD-10-CM | POA: Diagnosis not present

## 2018-06-25 DIAGNOSIS — M7751 Other enthesopathy of right foot: Secondary | ICD-10-CM

## 2018-06-25 DIAGNOSIS — M722 Plantar fascial fibromatosis: Secondary | ICD-10-CM | POA: Diagnosis not present

## 2018-06-25 DIAGNOSIS — K219 Gastro-esophageal reflux disease without esophagitis: Secondary | ICD-10-CM | POA: Diagnosis not present

## 2018-06-25 DIAGNOSIS — E7800 Pure hypercholesterolemia, unspecified: Secondary | ICD-10-CM | POA: Diagnosis not present

## 2018-06-25 DIAGNOSIS — Z79899 Other long term (current) drug therapy: Secondary | ICD-10-CM | POA: Diagnosis not present

## 2018-06-25 DIAGNOSIS — M205X1 Other deformities of toe(s) (acquired), right foot: Secondary | ICD-10-CM

## 2018-06-25 NOTE — Telephone Encounter (Signed)
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

## 2018-06-25 NOTE — Telephone Encounter (Signed)
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  Pt states this is causing him sleep deprivation. Pt states that this has only been occurring for the past 2 nights.  Pt states when he is up and active, he is completely asymptomatic. Pt states he went to his PCP about this, and he stated to the pt that its only "mild PVCs," and he felt this was benign.  Pt states he is taking all his cardiac meds prescribed.  Pt states he would feel much more reassured if he could be assessed by Cards, before assuming that his issue is "benign." Pt states his HR and BP have been running WNL's, with last readings: HR-62 and BP-113/68.  Scheduled the pt to see Richardson Dopp PA-C for tomorrow 10/25 at 1015.  Advised the pt to arrive 15 mins prior to this appt.  Advised the pt to hydrate well, continue his current meds and continue taking his beta blocker.  Advised the pt to call us back if symptoms worsen between now and tomorrow.  Informed the pt that Dr Meda Coffee is out of the office, but I will route this message to her as a general FYI, of current plan.  Pt verbalized understanding and agrees with this plan. Pt more than gracious for all the assistance provided.

## 2018-06-26 ENCOUNTER — Ambulatory Visit (INDEPENDENT_AMBULATORY_CARE_PROVIDER_SITE_OTHER): Payer: BLUE CROSS/BLUE SHIELD | Admitting: Physician Assistant

## 2018-06-26 ENCOUNTER — Ambulatory Visit (INDEPENDENT_AMBULATORY_CARE_PROVIDER_SITE_OTHER): Payer: BLUE CROSS/BLUE SHIELD

## 2018-06-26 ENCOUNTER — Encounter: Payer: Self-pay | Admitting: Physician Assistant

## 2018-06-26 VITALS — BP 110/52 | HR 62 | Ht 63.5 in | Wt 152.8 lb

## 2018-06-26 DIAGNOSIS — I2510 Atherosclerotic heart disease of native coronary artery without angina pectoris: Secondary | ICD-10-CM

## 2018-06-26 DIAGNOSIS — I10 Essential (primary) hypertension: Secondary | ICD-10-CM

## 2018-06-26 DIAGNOSIS — E785 Hyperlipidemia, unspecified: Secondary | ICD-10-CM

## 2018-06-26 DIAGNOSIS — R002 Palpitations: Secondary | ICD-10-CM | POA: Diagnosis not present

## 2018-06-26 LAB — BASIC METABOLIC PANEL
BUN/Creatinine Ratio: 13 (ref 10–24)
BUN: 15 mg/dL (ref 8–27)
CO2: 26 mmol/L (ref 20–29)
CREATININE: 1.12 mg/dL (ref 0.76–1.27)
Calcium: 9.5 mg/dL (ref 8.6–10.2)
Chloride: 98 mmol/L (ref 96–106)
GFR calc non Af Amer: 70 mL/min/{1.73_m2} (ref >59)
GFR, EST AFRICAN AMERICAN: 80 mL/min/{1.73_m2} (ref >59)
Glucose: 89 mg/dL (ref 65–99)
POTASSIUM: 4.5 mmol/L (ref 3.5–5.2)
Sodium: 137 mmol/L (ref 134–144)

## 2018-06-26 LAB — MAGNESIUM: Magnesium: 2.2 mg/dL (ref 1.6–2.3)

## 2018-06-26 NOTE — Patient Instructions (Signed)
Medication Instructions:  1. Your physician recommends that you continue on your current medications as directed. Please refer to the Current Medication list given to you today.  If you need a refill on your cardiac medications before your next appointment, please call your pharmacy.   Lab work: TODAY BMET, MAGNESIUM LEVEL If you have labs (blood work) drawn today and your tests are completely normal, you will receive your results only by: Marland Kitchen MyChart Message (if you have MyChart) OR . A paper copy in the mail If you have any lab test that is abnormal or we need to change your treatment, we will call you to review the results.  Testing/Procedures: Your physician has recommended that you wear an 30 DAYevent monitor. Event monitors are medical devices that record the heart's electrical activity. Doctors most often [place] these monitors to diagnose arrhythmias. Arrhythmias are problems with the speed or rhythm of the heartbeat. The monitor is a small, portable device. You can wear one while you do your normal daily activities. This is usually used to diagnose what is causing palpitations/syncope (passing out).    Follow-Up: At [facility], you and your health needs are our priority.  As part of our continuing mission to provide you with exceptional heart care, we have created designated Provider Care Teams.  These Care Teams include your primary Cardiologist (physician) and Advanced Practice Providers (APPs -  Physician Assistants and Nurse Practitioners) who all work together to provide you with the care you need, when you need it. You will need a follow up appointment in 6 weeks.  Please call our office 2 months in advance to schedule this appointment.  You may see Ena Dawley, MD or one of the following Advanced Practice Providers onyour designated Care Team:   Lyda Jester, PA-C Melina Copa, PA-C . Ermalinda Barrios, PA-C  Any Other Special Instructions Will Be Listed Below (If  Applicable).

## 2018-06-26 NOTE — Progress Notes (Signed)
Cardiology Office Note:    Date:  06/26/2018   ID:  Eric Rhodes, DOB 28-Aug-1956, MRN [id number]  PCP:  Eric Lass, MD  Cardiologist:  Eric Dawley, MD   Electrophysiologist:  None   Referring MD: Eric Lass, MD   Chief Complaint  Patient presents with  . Palpitations     History of Present Illness:    Eric Rhodes is a 62 y.o. male with coronary artery disease, GERD/peptic ulcer disease, hypertension, hyperlipidemia, family history of sudden cardiac death.  Nuclear stress test in October 2018 demonstrated no ischemia or scar.  Patient ultimately underwent coronary CTA in May 2019 which demonstrated <30% plaque in the left main, <50% calcific plaque in the proximal and mid LAD, mid LAD myocardial bridging, 50-75% calcific plaque in the ostial D1, <30% calcific plaque in the proximal and mid and distal RCA, <30% calcific plaque in the mid PLB.  The calcium score was 285.  FFR of the diagonal was normal.  He was noted to have 4 beats of nonsustained ventricular tachycardia on his ECG.  Initially, Dr. Meda Rhodes recommended referral to electrophysiology.  However, she ultimately decided that this was not necessary.  He has also been followed in our lipid clinic and has been started on evolocumab.     Eric Rhodes returns for evaluation of palpitations.  He is here today with his wife.  He was hospitalized one year ago with chest discomfort and was ultimately diagnosed with peptic ulcer disease.  He does note that he was having a lot of PVCs at that time and decreased his caffeine intake with some improvement.  He has recently had his Xanax and zolpidem changed to trazodone to help with sleep.  He was intolerant of this and had to stop it.  His primary care doctor recently placed him on doxepin and changed his proton pump inhibitor to Protonix.  Recently, he has had more palpitations at night.  He has had a lot of chest and abdominal pressure with associated nausea.  He has taken  Tums and Gas-X with improvement.  He remains quite active and rides his bike 3 times a week.  He denies any exertional chest discomfort or exertional breath.  He denies orthopnea, paroxysmal nocturnal dyspnea or lower extremity swelling.  Prior CV studies:   The following studies were reviewed today:  Coronary CTA 01/14/2018 Calcium Score: Calcium noted in LM, LAD and RCA Coronary Arteries: Right dominant with no anomalies LM: Less than 30% calcific plaque LAD: Less than 50% calcific plaque in proximal and mid vessel There also appears to be and area of myocardial bridging in the mid LAD D1: Large vessel with 50-75% calcific plaque at the ostium D2: Normal Circumflex: Normal OM1: Normal OM2: Normal RCA: Less than 30% calcific plaque in the proximal mid and distal vessel PDA: Normal PLA: Less than 30% calcific plaque in the mid PLB IMPRESSION: 1. Calcium Score 285 which is 5 th percentile for age and sex 2.  Normal aortic root 3.3 cm 3. CAD see description above Possible obstructive lesion at the ostium of a large first diagonal Study will be sent for FFR-CT 4.  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  No reversible ischemia or infarction. 2. Normal left ventricular wall motion. 3. Left ventricular ejection fraction 61% 4. Non invasive risk stratification*: Low   Past Medical History:  Diagnosis Date  . BPH (benign prostatic hyperplasia)   . Chronic abdominal pain   . Chronic insomnia   . GERD (gastroesophageal reflux disease)    diet controlled  . H/O ETOH abuse   . Hernia, inguinal, left   . History of  colonoscopy 2004  . History of endoscopy 2004  . Irritable bowel disease   . Prostatitis    Surgical Hx: The patient  has a past surgical history that includes Anterior cruciate ligament repair (Left, 1978); Colonoscopy; Upper gastrointestinal endoscopy; Wisdom tooth extraction; Inguinal hernia repair (11/07/2011); Hernia repair (Left, 11/2011); Bunionectomy; HEMORRHOIDAL BANDING (10/2014); and COLON MEDOFF.   Current Medications: Current Meds  Medication Sig  . aspirin 81 MG EC tablet Take 81 mg by mouth daily. Swallow whole.  . cetirizine (ZYRTEC) 10 MG tablet Take 10 mg by mouth daily as needed for allergies.   . cholecalciferol (VITAMIN D) 1000 units tablet Take 1,000 Units by mouth daily.  Marland Kitchen co-enzyme Q-10 30 MG capsule Take 30 mg by mouth daily.  Marland Kitchen dicyclomine (BENTYL) 10 MG capsule Take 10 mg by mouth daily after supper.  . Evolocumab (REPATHA SURECLICK) 485 MG/ML SOAJ Inject 140 mg into the skin every 14 (fourteen) days.  Marland Kitchen losartan (COZAAR) 25 MG tablet Take 1 tablet (25 mg total) by mouth daily.  . metoprolol tartrate (LOPRESSOR) 25 MG tablet Take 0.5 tablets (12.5 mg total) by mouth 2 (two) times daily.  . Multiple Vitamin (MULTIVITAMIN) tablet Take 1 tablet daily by mouth.  . nitroGLYCERIN (NITROSTAT) 0.4 MG SL tablet Place 1 tablet (0.4 mg total) under the tongue every 5 (five) minutes x 3 doses as needed for chest pain.  . Omega-3 Fatty Acids (FISH OIL PO) Take 1 capsule by mouth 2 (two) times daily.  . pantoprazole (PROTONIX) 40 MG tablet Take 40 mg by mouth daily.  . tacrolimus (PROTOPIC) 0.1 % ointment Apply 1 application topically daily as needed. For excema  . vitamin B-12 (CYANOCOBALAMIN) 100 MCG tablet Take 100 mcg by mouth daily.     Allergies:   Augmentin [amoxicillin-pot clavulanate]; Betadine [povidone iodine]; Clavulanic acid; and Neosporin [neomycin-bacitracin zn-polymyx]   Social History   Tobacco Use  . Smoking status: Never Smoker  . Smokeless tobacco:  Never Used  Substance Use Topics  . Alcohol use: No  . Drug use: No     Family Hx: The patient's family history includes ADD / ADHD in his son and son; COPD in his father; Clotting disorder in his mother; Diabetes in his brother; Heart attack in his brother; Heart disease in his mother; Hypercholesterolemia in his mother; Hypertension in his father and mother; Myelodysplastic syndrome in his mother; Obesity in his brother; Psoriasis in his brother.  ROS:   Please see the history of present illness.    Review of Systems  Cardiovascular: Positive for chest pain and palpitations.  Gastrointestinal: Positive for abdominal pain, constipation and diarrhea.   All other systems reviewed and are negative.   EKGs/Labs/Other Test Reviewed:    EKG:  EKG is  ordered today.  The ekg ordered today demonstrates sinus bradycardia, heart rate 52, normal axis, no acute ST-T wave changes, QTC 385, similar to old EKGs  Recent Labs: 03/27/2018: ALT 26 04/30/2018: BUN 14; Creatinine, Ser 1.21; Hemoglobin 15.1; Platelets 220; Potassium 3.9; Sodium 140 05/01/2018: Magnesium 1.9;  TSH 3.208   Recent Lipid Panel Lab Results  Component Value Date/Time   CHOL 91 (L) 03/27/2018 07:55 AM   TRIG 43 03/27/2018 07:55 AM   HDL 62 03/27/2018 07:55 AM   CHOLHDL 1.5 03/27/2018 07:55 AM   CHOLHDL 3.1 06/18/2017 04:59 AM   LDLCALC 20 03/27/2018 07:55 AM    Physical Exam:    VS:  BP (!) 110/52   Pulse 62   Ht 5' 3.5" (1.613 m)   Wt 152 lb 12.8 oz (69.3 kg)   SpO2 97%   BMI 26.64 kg/m     Wt Readings from Last 3 Encounters:  06/26/18 152 lb 12.8 oz (69.3 kg)  04/30/18 145 lb (65.8 kg)  12/10/17 155 lb 3.2 oz (70.4 kg)     Physical Exam  Constitutional: He is oriented to person, place, and time. He appears well-developed and well-nourished. No distress.  HENT:  Head: Normocephalic and atraumatic.  Eyes: No scleral icterus.  Neck: No JVD present. No thyromegaly present.  Cardiovascular: Normal rate and  regular rhythm.  No murmur heard. Pulmonary/Chest: Effort normal. He has no wheezes. He has no rales.  Abdominal: Soft. He exhibits no distension.  Musculoskeletal: He exhibits no edema.  Lymphadenopathy:    He has no cervical adenopathy.  Neurological: He is alert and oriented to person, place, and time.  Skin: Skin is warm and dry.  Psychiatric: He has a normal mood and affect.    ASSESSMENT & PLAN:    Palpitations Etiology not entirely clear.  His symptoms seem to be related to gastrointestinal issues.  He already has a gastroenterologist.  He has also had some medication adjustments recently.  He does have chest discomfort but all of this seems to improve with antacid therapy.  His ECG does not demonstrate any acute changes.  He has had an issue with PVCs as well as nonsustained ventricular tachycardia in the past.  Ejection fraction by echocardiogram last year was normal.  He has not had any syncope.  -Obtain BMET, magnesium  -Arrange 30-day event monitor  -Follow-up with Dr. Meda Rhodes in 6 weeks  Coronary artery disease involving native coronary artery of native heart without angina pectoris Moderate nonobstructive coronary artery disease by coronary CTA May 2019.  As noted, he has had some chest discomfort related to his palpitations and gastrointestinal issues.  However, he denies exertional symptoms.  He has no ischemic changes on his ECG.  He remains quite active without limitations.  At this point, I do not feel that he needs further ischemic testing.  If his monitor demonstrates nonsustained ventricular tachycardia or he has more exertional symptoms or exercise intolerance, we will need to consider cardiac catheterization.  Hyperlipidemia, unspecified hyperlipidemia type He is currently on evolocumab.  Continue follow-up with lipid clinic.  Essential hypertension The patient's blood pressure is controlled on his current regimen.  Continue current therapy.    Dispo:  Return in  about 6 weeks (around 08/07/2018) for Follow up after testing w/ Dr. Meda Rhodes.   Medication Adjustments/Labs and Tests Ordered: Current medicines are reviewed at length with the patient today.  Concerns regarding medicines are outlined above.  Tests Ordered: Orders Placed This Encounter  Procedures  . Basic Metabolic Panel (BMET)  . Magnesium  . Cardiac event monitor  . EKG 12-Lead   Medication Changes: No orders of the defined types were placed in this encounter.   Signed, Richardson Dopp, PA-C  06/26/2018 1:35 PM    [facility] [id number] [place], [place]  [address] Phone: [phone]; Fax: [phone]

## 2018-06-28 DIAGNOSIS — H6980 Other specified disorders of Eustachian tube, unspecified ear: Secondary | ICD-10-CM | POA: Diagnosis not present

## 2018-06-28 NOTE — Progress Notes (Signed)
Subjective: Eric Rhodes is a 62 y.o. is seen today in office s/p right foot cheilectomy preformed on 04/15/2018.  Overall he states he is improving.  He still has some stiffness to the big toe joint which does limit his activity at times.  Overall he states he is doing well he is very thankful.  He is able to ride his bike as well as workout but he does certain exercises which requires him to bend his toe he gets discomfort.  He also notes a mild swelling and inflammation towards the end of the day but he denies any drainage from the incision and the incision is well-healed at this point.  He is also modified his orthotics to add a varus wedge to help take pressure at the first MPJ.  He has no other concerns today.  Objective: General: No acute distress, AAOx3  DP/PT pulses palpable 2/4, CRT < 3 sec to all digits.  Protective sensation intact. Motor function intact.  Right foot: Incision is well coapted without any evidence of dehiscence and scars formed.  Mild swelling to the area and there is restriction of first MPJ range of motion however the same as what it was.  Minimal discomfort at end range of motion.  Mild crepitation first MPJ range of motion.  There is no erythema or warmth there is no clinical signs of infection noted today. No other open lesions or pre-ulcerative lesions.  No pain with calf compression, swelling, warmth, erythema.   Assessment and Plan:  Status post right foot surgery, improving  -Treatment options discussed including all alternatives, risks, and complication -X-rays were obtained and reviewed.  Forced lateral x-rays were also performed which does reveal that there is motion of the first MPJ.  No evidence of acute fracture. -Continue with rehab exercises.  He is going to [facility] trying to find a physical therapist for him for to continue with physical therapy here for now.  Continues gradually increase his range of motion.  I do think  he benefit from a new orthotic with a first ray cut out.  I discussed this with them as well as direct and he was molded for new orthotics today.  Although this will help with the first MPJ range of motion.  Also he has a history of plantar fasciitis so hopefully orthotics will be beneficial.  He wants the orthotics from [facility].  -Continue compression anklet.  Trula Slade DPM

## 2018-07-08 DIAGNOSIS — R002 Palpitations: Secondary | ICD-10-CM | POA: Diagnosis not present

## 2018-07-08 DIAGNOSIS — R0789 Other chest pain: Secondary | ICD-10-CM | POA: Diagnosis not present

## 2018-07-08 DIAGNOSIS — R079 Chest pain, unspecified: Secondary | ICD-10-CM | POA: Diagnosis not present

## 2018-07-08 DIAGNOSIS — R001 Bradycardia, unspecified: Secondary | ICD-10-CM | POA: Diagnosis not present

## 2018-08-03 ENCOUNTER — Telehealth: Payer: Self-pay

## 2018-08-03 NOTE — Telephone Encounter (Signed)
-----   Message from Liliane Shi, [place] sent at 08/03/2018  8:04 AM EST ----- Please call patient.  The event monitor shows no arrhythmias.    Recommendations:  - Continue current medications and follow up as planned.  Richardson Dopp, PA-C    08/03/2018 8:03 AM

## 2018-08-03 NOTE — Telephone Encounter (Signed)
Notes recorded by Frederik Schmidt, RN on 08/03/2018 at 8:13 AM EST The patient has been notified of the result and verbalized understanding. He would like to reschedule his appt unless it is really necessary for him to keep it, please advise. Thank you. All questions (if any) were answered. Frederik Schmidt, RN 08/03/2018 8:12 AM

## 2018-08-04 NOTE — Telephone Encounter (Signed)
Already addressed.  Please see mychart message in regards to this message.  Pts 12/17 appt cancelled, as okayed by Dr Meda Coffee.

## 2018-08-04 NOTE — Telephone Encounter (Signed)
Pt already made aware of normal event monitor, by Triage RN Legrand Como Dapp.

## 2018-08-04 NOTE — Telephone Encounter (Signed)
No need to see on 12/17

## 2018-08-04 NOTE — Telephone Encounter (Signed)
He can reschedule.

## 2018-08-04 NOTE — Telephone Encounter (Signed)
   Sinus rhythm.  No arrhythmias or pauses.   Normal 30 day event monitor.  No need to see on 12/17

## 2018-08-18 ENCOUNTER — Ambulatory Visit: Payer: BLUE CROSS/BLUE SHIELD | Admitting: Cardiology

## 2018-10-02 DIAGNOSIS — E785 Hyperlipidemia, unspecified: Secondary | ICD-10-CM | POA: Diagnosis not present

## 2018-10-02 DIAGNOSIS — J209 Acute bronchitis, unspecified: Secondary | ICD-10-CM | POA: Diagnosis not present

## 2018-10-02 DIAGNOSIS — Z0000 Encounter for general adult medical examination without abnormal findings: Secondary | ICD-10-CM | POA: Diagnosis not present

## 2018-10-02 DIAGNOSIS — I10 Essential (primary) hypertension: Secondary | ICD-10-CM | POA: Diagnosis not present

## 2018-10-02 DIAGNOSIS — Z6825 Body mass index (BMI) 25.0-25.9, adult: Secondary | ICD-10-CM | POA: Diagnosis not present

## 2018-10-02 DIAGNOSIS — J019 Acute sinusitis, unspecified: Secondary | ICD-10-CM | POA: Diagnosis not present

## 2018-10-02 DIAGNOSIS — R030 Elevated blood-pressure reading, without diagnosis of hypertension: Secondary | ICD-10-CM | POA: Diagnosis not present

## 2018-11-16 ENCOUNTER — Other Ambulatory Visit: Payer: Self-pay

## 2018-11-16 DIAGNOSIS — E785 Hyperlipidemia, unspecified: Secondary | ICD-10-CM

## 2018-11-16 DIAGNOSIS — R002 Palpitations: Secondary | ICD-10-CM

## 2018-11-16 MED ORDER — LOSARTAN POTASSIUM 25 MG PO TABS: 25.0000 mg | ORAL_TABLET | Freq: Every day | ORAL | 1 refills | Status: DC

## 2018-11-19 MED ORDER — LOSARTAN POTASSIUM 25 MG PO TABS: 25.0000 mg | ORAL_TABLET | Freq: Every day | ORAL | 1 refills | Status: DC

## 2018-11-19 NOTE — Addendum Note (Signed)
Addended by: De Burrs on: 11/19/2018 08:42 AM   Modules accepted: Orders

## 2018-12-16 ENCOUNTER — Other Ambulatory Visit: Payer: Self-pay | Admitting: Cardiology

## 2019-02-12 ENCOUNTER — Telehealth: Payer: Self-pay | Admitting: *Deleted

## 2019-02-12 ENCOUNTER — Encounter: Payer: Self-pay | Admitting: *Deleted

## 2019-02-12 DIAGNOSIS — F5101 Primary insomnia: Secondary | ICD-10-CM | POA: Diagnosis not present

## 2019-02-12 DIAGNOSIS — E7800 Pure hypercholesterolemia, unspecified: Secondary | ICD-10-CM | POA: Diagnosis not present

## 2019-02-12 DIAGNOSIS — K219 Gastro-esophageal reflux disease without esophagitis: Secondary | ICD-10-CM | POA: Diagnosis not present

## 2019-02-12 DIAGNOSIS — N529 Male erectile dysfunction, unspecified: Secondary | ICD-10-CM | POA: Diagnosis not present

## 2019-02-12 NOTE — Telephone Encounter (Signed)
Virtual Visit Pre-Appointment Phone Call  "(Name), I am calling you today to discuss your upcoming appointment. We are currently trying to limit exposure to the virus that causes COVID-19 by seeing patients at home rather than in the office."  1. "What is the BEST phone number to call the day of the visit?" - include this in appointment notes-YES UPDATED  2. "Do you have or have access to (through a family member/friend) a smartphone with video capability that we can use for your visit?" a. If yes - list this number in appt notes as "cell" (if different from BEST phone #) and list the appointment type as a VIDEO visit in appointment notes-YES UPDATED   3. Confirm consent - "In the setting of the current Covid19 crisis, you are scheduled for a (video) visit with Dr.Nelson on 02/24/19 at 0830.  Just as we do with many in-office visits, in order for you to participate in this visit, we must obtain consent.  If you'd like, I can send this to your mychart (if signed up) or email for you to review.  Otherwise, I can obtain your verbal consent now.  All virtual visits are billed to your insurance company just like a normal visit would be.  By agreeing to a virtual visit, we'd like you to understand that the technology does not allow for your provider to perform an examination, and thus may limit your provider's ability to fully assess your condition. If your provider identifies any concerns that need to be evaluated in person, we will make arrangements to do so.  Finally, though the technology is pretty good, we cannot assure that it will always work on either your or our end, and in the setting of a video visit, we may have to convert it to a phone-only visit.  In either situation, we cannot ensure that we have a secure connection.  Are you willing to proceed?" STAFF: Did the patient verbally acknowledge consent to telehealth visit? Document YES/NO here: CONSENT TO TREAT SENT TO PTS MYCHART TO REVIEW.  4.  Advise patient to be prepared - "Two hours prior to your appointment, go ahead and check your blood pressure, pulse, oxygen saturation, and your weight (if you have the equipment to check those) and write them all down. When your visit starts, your provider will ask you for this information. If you have an Apple Watch or Kardia device, please plan to have heart rate information ready on the day of your appointment. Please have a pen and paper handy nearby the day of the visit as well."-PT CAN TAKE HR AND WEIGHT BUT HAS NO BP CUFF  5. Give patient instructions for smartphone Doxy.me as below if video visit (depending on what platform provider is using)-YES GIVEN  6. Inform patient they will receive a phone call 15 minutes prior to their appointment time (may be from unknown caller ID) so they should be prepared to answer-PT [place] has been deemed a candidate for a follow-up tele-health visit to limit community exposure during the Covid-19 pandemic. I spoke with the patient via phone to ensure availability of phone/video source, confirm preferred email & phone number, and discuss instructions and expectations.  I reminded Eric Rhodes to be prepared with any vital sign and/or heart rhythm information that could potentially be obtained via home monitoring, at the time of his visit. I reminded Eric Rhodes to expect a phone call prior  to his visit.  Eric Alpha, LPN 3/57/0177 9:39 AM     IF USING  DOXY.ME - The patient will receive a link just prior to their visit by text.-PT AWARE     FULL LENGTH CONSENT FOR TELE-HEALTH VISIT   I hereby voluntarily request, consent and authorize [facility] and its employed or contracted physicians, physician assistants, nurse practitioners or other licensed health care professionals (the Practitioner), to provide me with telemedicine health care services (the "Services") as deemed necessary by the  treating Practitioner. I acknowledge and consent to receive the Services by the Practitioner via telemedicine. I understand that the telemedicine visit will involve communicating with the Practitioner through live audiovisual communication technology and the disclosure of certain medical information by electronic transmission. I acknowledge that I have been given the opportunity to request an in-person assessment or other available alternative prior to the telemedicine visit and am voluntarily participating in the telemedicine visit.  I understand that I have the right to withhold or withdraw my consent to the use of telemedicine in the course of my care at any time, without affecting my right to future care or treatment, and that the Practitioner or I may terminate the telemedicine visit at any time. I understand that I have the right to inspect all information obtained and/or recorded in the course of the telemedicine visit and may receive copies of available information for a reasonable fee.  I understand that some of the potential risks of receiving the Services via telemedicine include:  Marland Kitchen Delay or interruption in medical evaluation due to technological equipment failure or disruption; . Information transmitted may not be sufficient (e.g. poor resolution of images) to allow for appropriate medical decision making by the Practitioner; and/or  . In rare instances, security protocols could fail, causing a breach of personal health information.  Furthermore, I acknowledge that it is my responsibility to provide information about my medical history, conditions and care that is complete and accurate to the best of my ability. I acknowledge that Practitioner's advice, recommendations, and/or decision may be based on factors not within their control, such as incomplete or inaccurate data provided by me or distortions of diagnostic images or specimens that may result from electronic transmissions. I understand  that the practice of medicine is not an exact science and that Practitioner makes no warranties or guarantees regarding treatment outcomes. I acknowledge that I will receive a copy of this consent concurrently upon execution via email to the email address I last provided but may also request a printed copy by calling the office of [facility] [place].    I understand that my insurance will be billed for this visit.   I have read or had this consent read to me. . I understand the contents of this consent, which adequately explains the benefits and risks of the Services being provided via telemedicine.  . I have been provided ample opportunity to ask questions regarding this consent and the Services and have had my questions answered to my satisfaction. . I give my informed consent for the services to be provided through the use of telemedicine in my medical care  By participating in this telemedicine visit I agree to the above.-CONSENT FOR DR Meda Coffee TO TREAT THE PT VIA VIDEO VISIT ON 6/24 WAS SENT TO HIS MYCHART TO REVIEW.

## 2019-02-23 ENCOUNTER — Telehealth: Payer: Self-pay | Admitting: *Deleted

## 2019-02-23 NOTE — Telephone Encounter (Signed)
Left the pt a detailed message to call the office back and ask for myself or Dr. Francesca Oman CMA Bevelyn Buckles, so that we can do COVID PRE-SCREENING QUESTIONS on the pt, per protocol, before his OV appt with Dr Meda Coffee tomorrow 02/24/19 at [place].

## 2019-02-23 NOTE — Telephone Encounter (Signed)
    COVID-19 Pre-Screening Questions:  . In the past 7 to 10 days have you had a cough,  shortness of breath, headache, congestion, fever (100 or greater) body aches, chills, sore throat, or sudden loss of taste or sense of smell?-NO . Have you been around anyone with known Covid 19.-NO . Have you been around anyone who is awaiting Covid 19 test results in the past 7 to 10 days?-NO . Have you been around anyone who has been exposed to Covid 19, or has mentioned symptoms of Covid 19 within the past 7 to 10 days?-NO   Pt covid pre-screen questions were done and answered "no" to all.  Pt is aware of no visitor policy and to wear his face mask at all times to this appt.  Pt verbalized understanding and agrees with this plan.                 .  

## 2019-02-24 ENCOUNTER — Other Ambulatory Visit: Payer: Self-pay

## 2019-02-24 ENCOUNTER — Ambulatory Visit (INDEPENDENT_AMBULATORY_CARE_PROVIDER_SITE_OTHER): Payer: BC Managed Care – PPO | Admitting: Cardiology

## 2019-02-24 ENCOUNTER — Encounter: Payer: Self-pay | Admitting: Cardiology

## 2019-02-24 VITALS — BP 108/64 | HR 65 | Ht 63.5 in | Wt 151.6 lb

## 2019-02-24 DIAGNOSIS — R072 Precordial pain: Secondary | ICD-10-CM

## 2019-02-24 DIAGNOSIS — I2584 Coronary atherosclerosis due to calcified coronary lesion: Secondary | ICD-10-CM | POA: Diagnosis not present

## 2019-02-24 DIAGNOSIS — R002 Palpitations: Secondary | ICD-10-CM | POA: Diagnosis not present

## 2019-02-24 DIAGNOSIS — E785 Hyperlipidemia, unspecified: Secondary | ICD-10-CM | POA: Diagnosis not present

## 2019-02-24 DIAGNOSIS — I10 Essential (primary) hypertension: Secondary | ICD-10-CM

## 2019-02-24 DIAGNOSIS — I2510 Atherosclerotic heart disease of native coronary artery without angina pectoris: Secondary | ICD-10-CM | POA: Diagnosis not present

## 2019-02-24 LAB — NMR LIPOPROF + GRAPH

## 2019-02-24 NOTE — Patient Instructions (Addendum)
Medication Instructions:  Your physician recommends that you continue on your current medications as directed. Please refer to the Current Medication list given to you today.  If you need a refill on your cardiac medications before your next appointment, please call your pharmacy.   Lab work: TODAY: NMR WITH LIPIDS, BMET, CBC, TSH, & LFT  If you have labs (blood work) drawn today and your tests are completely normal, you will receive your results only by: Marland Kitchen MyChart Message (if you have MyChart) OR . A paper copy in the mail If you have any lab test that is abnormal or we need to change your treatment, we will call you to review the results.  Testing/Procedures: None ordered  Follow-Up: At [facility], you and your health needs are our priority.  As part of our continuing mission to provide you with exceptional heart care, we have created designated Provider Care Teams.  These Care Teams include your primary Cardiologist (physician) and Advanced Practice Providers (APPs -  Physician Assistants and Nurse Practitioners) who all work together to provide you with the care you need, when you need it. You will need a follow up appointment in 4 months.  Please call our office 2 months in advance to schedule this appointment.  You may see Ena Dawley, MD or one of the following Advanced Practice Providers on your designated Care Team:   [place], PA-C Melina Copa, PA-C . Ermalinda Barrios, PA-C  Any Other Special Instructions Will Be Listed Below (If Applicable).

## 2019-02-24 NOTE — Progress Notes (Signed)
Cardiology Office Note:    Date:  02/24/2019   ID:  Eric Rhodes, DOB Sep 11, 1955, MRN [id number]  PCP:  Kathyrn Lass, MD  Cardiologist:  Ena Dawley, MD   Electrophysiologist:  None   Referring MD: Kathyrn Lass, MD   Chief complain: Chest pain  History of Present Illness:    Eric Rhodes is a 63 y.o. male with coronary artery disease, GERD/peptic ulcer disease, hypertension, hyperlipidemia, family history of sudden cardiac death.  Nuclear stress test in October 2018 demonstrated no ischemia or scar.  Patient ultimately underwent coronary CTA in May 2019 which demonstrated <30% plaque in the left main, <50% calcific plaque in the proximal and mid LAD, mid LAD myocardial bridging, 50-75% calcific plaque in the ostial D1, <30% calcific plaque in the proximal and mid and distal RCA, <30% calcific plaque in the mid PLB.  The calcium score was 285.  FFR of the diagonal was normal.  He was noted to have 4 beats of nonsustained ventricular tachycardia on his ECG.  Initially, Dr. Meda Coffee recommended referral to electrophysiology.  However, she ultimately decided that this was not necessary.  He has also been followed in our lipid clinic and has been started on evolocumab.     02/23/2019-the patient is coming after 6 months, he states that in the last 10 days he has developed epigastric chest tightness after he eats dinner, since he was diagnosed with moderate nonobstructive CAD he has changed diet, he has lost significant amount of weight now being vegan watching his food portions calories and food quality.  He also exercises at the moderate to high level.  He does not have any chest pain while exercising.  No worsening exercise capacity no shortness of breath.    Prior CV studies:   The following studies were reviewed today:  Coronary CTA 01/14/2018 Calcium Score: Calcium noted in LM, LAD and RCA Coronary Arteries: Right dominant with no anomalies LM: Less than 30% calcific plaque  LAD: Less than 50% calcific plaque in proximal and mid vessel There also appears to be and area of myocardial bridging in the mid LAD D1: Large vessel with 50-75% calcific plaque at the ostium D2: Normal Circumflex: Normal OM1: Normal OM2: Normal RCA: Less than 30% calcific plaque in the proximal mid and distal vessel PDA: Normal PLA: Less than 30% calcific plaque in the mid PLB IMPRESSION: 1. Calcium Score 285 which is 5 th percentile for age and sex 2.  Normal aortic root 3.3 cm 3. CAD see description above Possible obstructive lesion at the ostium of a large first diagonal Study will be sent for FFR-CT 4.  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 No reversible ischemia or infarction. 2. Normal left ventricular wall motion. 3. Left ventricular ejection fraction 61% 4. Non invasive risk stratification*: Low   Past Medical History:  Diagnosis Date  . BPH (benign prostatic hyperplasia)   . Chronic abdominal pain   . Chronic insomnia   . GERD (gastroesophageal reflux disease)    diet controlled  . H/O ETOH abuse   . Hernia, inguinal, left   . History of colonoscopy 2004  . History of endoscopy 2004  . Irritable bowel disease   .  Prostatitis    Surgical Hx: The patient  has a past surgical history that includes Anterior cruciate ligament repair (Left, 1978); Colonoscopy; Upper gastrointestinal endoscopy; Wisdom tooth extraction; Inguinal hernia repair (11/07/2011); Hernia repair (Left, 11/2011); Bunionectomy; HEMORRHOIDAL BANDING (10/2014); and COLON MEDOFF.   Current Medications: Current Meds  Medication Sig  .  aspirin 81 MG EC tablet Take 81 mg by mouth daily. Swallow whole.  . cetirizine (ZYRTEC) 10 MG tablet Take 10 mg by mouth daily as needed for allergies.   . cholecalciferol (VITAMIN D) 1000 units tablet Take 1,000 Units by mouth daily.  Marland Kitchen co-enzyme Q-10 30 MG capsule Take 30 mg by mouth daily.  . Evolocumab (REPATHA SURECLICK) 176 MG/ML SOAJ Inject 1 pen into the skin every 14 (fourteen) days.  Marland Kitchen losartan (COZAAR) 25 MG tablet Take 1 tablet (25 mg total) by mouth daily.  . metoprolol tartrate (LOPRESSOR) 25 MG tablet Take 0.5 tablets (12.5 mg total) by mouth 2 (two) times daily.  . Multiple Vitamin (MULTIVITAMIN) tablet Take 1 tablet daily by mouth.  . nitroGLYCERIN (NITROSTAT) 0.4 MG SL tablet Place 1 tablet (0.4 mg total) under the tongue every 5 (five) minutes x 3 doses as needed for chest pain.  Marland Kitchen nystatin cream (MYCOSTATIN) Apply 1 application topically as needed.  . Omega-3 Fatty Acids (FISH OIL PO) Take 1 capsule by mouth 2 (two) times daily.  . pantoprazole (PROTONIX) 40 MG tablet Take 40 mg by mouth daily.  . rosuvastatin (CRESTOR) 5 MG tablet Take 5 mg by mouth daily.  . tacrolimus (PROTOPIC) 0.1 % ointment Apply 1 application topically daily as needed. For excema  . VENTOLIN HFA 108 (90 Base) MCG/ACT inhaler Use as needed  . vitamin B-12 (CYANOCOBALAMIN) 100 MCG tablet Take 100 mcg by mouth daily.     Allergies:   Augmentin [amoxicillin-pot clavulanate], Betadine [povidone iodine], Clavulanic acid, and Neosporin [neomycin-bacitracin zn-polymyx]   Social History   Tobacco Use  . Smoking status: Never Smoker  . Smokeless tobacco: Never Used  Substance Use Topics  . Alcohol use: No  . Drug use: No     Family Hx: The patient's family history includes ADD / ADHD in his son and son; COPD in his father; Clotting disorder in his mother; Diabetes in his brother; Heart attack in his brother; Heart disease in his mother; Hypercholesterolemia in his mother; Hypertension in his father  and mother; Myelodysplastic syndrome in his mother; Obesity in his brother; Psoriasis in his brother.  ROS:   Please see the history of present illness.    Review of Systems  Cardiovascular: Positive for chest pain and palpitations.  Gastrointestinal: Positive for abdominal pain, constipation and diarrhea.   All other systems reviewed and are negative.   EKGs/Labs/Other Test Reviewed:    EKG:  EKG is  ordered today.  The ekg ordered today demonstrates sinus bradycardia, heart rate 52, normal axis, no acute ST-T wave changes, QTC 385, similar to old EKGs  Recent Labs: 03/27/2018: ALT 26 04/30/2018: Hemoglobin 15.1; Platelets 220 05/01/2018: TSH 3.208 06/26/2018: BUN 15; Creatinine, Ser 1.12; Magnesium 2.2; Potassium 4.5; Sodium 137   Recent Lipid Panel Lab Results  Component Value Date/Time   CHOL 91 (L) 03/27/2018 07:55 AM   TRIG 43 03/27/2018 07:55 AM   HDL 62 03/27/2018 07:55 AM   CHOLHDL 1.5 03/27/2018 07:55 AM   CHOLHDL 3.1 06/18/2017 04:59 AM   LDLCALC 20 03/27/2018 07:55 AM    Physical Exam:    VS:  BP 108/64  Pulse 65   Ht 5' 3.5" (1.613 m)   Wt 151 lb 9.6 oz (68.8 kg)   SpO2 98%   BMI 26.43 kg/m     Wt Readings from Last 3 Encounters:  02/24/19 151 lb 9.6 oz (68.8 kg)  06/26/18 152 lb 12.8 oz (69.3 kg)  04/30/18 145 lb (65.8 kg)     Physical Exam  Constitutional: He is oriented to person, place, and time. He appears well-developed and well-nourished. No distress.  HENT:  Head: Normocephalic and atraumatic.  Eyes: No scleral icterus.  Neck: No JVD present. No thyromegaly present.  Cardiovascular: Normal rate and regular rhythm.  No murmur heard. Pulmonary/Chest: Effort normal. He has no wheezes. He has no rales.  Abdominal: Soft. He exhibits no distension.  Musculoskeletal:        General: No edema.  Lymphadenopathy:    He has no cervical adenopathy.  Neurological: He is alert and oriented to person, place, and time.  Skin: Skin is warm and dry.   Psychiatric: He has a normal mood and affect.    ASSESSMENT & PLAN:     Coronary artery disease involving native coronary artery of native heart without angina pectoris Moderate nonobstructive coronary artery disease by coronary CTA May 2019.  Normal FFR.  His chest pain is currently very asymptomatic and postprandial, this is most probably related to history of his ulcers, he is advised to follow-up with GI, call us back if his functional capacity changes or he develops exertional symptoms.  His EKG today is unchanged.  Palpitations Resolved, he had normal 30-day event monitor  Hyperlipidemia, unspecified hyperlipidemia type He is currently on evolocumab.  He is now vegan, he is encouraged to continue eating healthy diet and exercise, we will recheck LFTs and NMR lipids today.  Essential hypertension The patient's blood pressure is controlled on his current regimen.  Continue current therapy.    Dispo:  No follow-ups on file.   Medication Adjustments/Labs and Tests Ordered: Current medicines are reviewed at length with the patient today.  Concerns regarding medicines are outlined above.  Tests Ordered: No orders of the defined types were placed in this encounter.  Medication Changes: No orders of the defined types were placed in this encounter.   Signed, Ena Dawley, MD  02/24/2019 8:55 AM    [address], [place], [place]  [address] Phone: [phone]; Fax: [phone]

## 2019-02-25 LAB — HEPATIC FUNCTION PANEL
ALT: 22 IU/L (ref 0–44)
AST: 22 IU/L (ref 0–40)
Albumin: 4.8 g/dL (ref 3.8–4.8)
Alkaline Phosphatase: 49 IU/L (ref 39–117)
Bilirubin Total: 1.5 mg/dL — ABNORMAL HIGH (ref 0.0–1.2)
Bilirubin, Direct: 0.27 mg/dL (ref 0.00–0.40)
Total Protein: 6.4 g/dL (ref 6.0–8.5)

## 2019-02-25 LAB — NMR LIPOPROF + GRAPH
Cholesterol, Total: 101 mg/dL (ref 100–199)
HDL Particle Number: 38.5 umol/L (ref >=30.5)
HDL-C: 68 mg/dL (ref >39)
LDL Particle Number: <300 nmol/L (ref <1000)
LDL-C: 24 mg/dL (ref 0–99)
LP-IR Score: <25 (ref <=45)
Small LDL Particle Number: 111 nmol/L (ref <=527)
Triglycerides: 45 mg/dL (ref 0–149)

## 2019-02-25 LAB — CBC
Hematocrit: 45.2 % (ref 37.5–51.0)
Hemoglobin: 15.1 g/dL (ref 13.0–17.7)
MCH: 30.4 pg (ref 26.6–33.0)
MCHC: 33.4 g/dL (ref 31.5–35.7)
MCV: 91 fL (ref 79–97)
Platelets: 220 10*3/uL (ref 150–450)
RBC: 4.96 x10E6/uL (ref 4.14–5.80)
RDW: 12 % (ref 11.6–15.4)
WBC: 5.5 10*3/uL (ref 3.4–10.8)

## 2019-02-25 LAB — BASIC METABOLIC PANEL
BUN/Creatinine Ratio: 14 (ref 10–24)
BUN: 18 mg/dL (ref 8–27)
CO2: 25 mmol/L (ref 20–29)
Calcium: 9.4 mg/dL (ref 8.6–10.2)
Chloride: 103 mmol/L (ref 96–106)
Creatinine, Ser: 1.26 mg/dL (ref 0.76–1.27)
GFR calc Af Amer: 69 mL/min/{1.73_m2} (ref >59)
GFR calc non Af Amer: 60 mL/min/{1.73_m2} (ref >59)
Glucose: 103 mg/dL — ABNORMAL HIGH (ref 65–99)
Potassium: 4.8 mmol/L (ref 3.5–5.2)
Sodium: 141 mmol/L (ref 134–144)

## 2019-02-25 LAB — TSH: TSH: 2.48 u[IU]/mL (ref 0.450–4.500)

## 2019-03-31 DIAGNOSIS — N522 Drug-induced erectile dysfunction: Secondary | ICD-10-CM | POA: Diagnosis not present

## 2019-03-31 DIAGNOSIS — R310 Gross hematuria: Secondary | ICD-10-CM | POA: Diagnosis not present

## 2019-04-09 DIAGNOSIS — N522 Drug-induced erectile dysfunction: Secondary | ICD-10-CM | POA: Diagnosis not present

## 2019-04-28 DIAGNOSIS — H524 Presbyopia: Secondary | ICD-10-CM | POA: Diagnosis not present

## 2019-04-28 DIAGNOSIS — H52223 Regular astigmatism, bilateral: Secondary | ICD-10-CM | POA: Diagnosis not present

## 2019-05-12 DIAGNOSIS — Z0000 Encounter for general adult medical examination without abnormal findings: Secondary | ICD-10-CM | POA: Diagnosis not present

## 2019-05-12 DIAGNOSIS — Z23 Encounter for immunization: Secondary | ICD-10-CM | POA: Diagnosis not present

## 2019-06-01 ENCOUNTER — Telehealth: Payer: Self-pay | Admitting: Cardiology

## 2019-06-01 NOTE — Telephone Encounter (Signed)
I spoke to the patient and informed him that Dr Francesca Oman nurse will reach out to him on 9/30 for appointment advisement.  He verbalized understanding.

## 2019-06-01 NOTE — Telephone Encounter (Signed)
New message     Patient would like to speak to a nurse because we have no appointments open for October with Dr. Meda Coffee and he's going to be out of town from December to May.  Please call patient back to discuss.

## 2019-06-02 NOTE — Telephone Encounter (Signed)
Left a message for the pt to call back, for further assistance with getting an appt made with Dr. Meda Coffee.

## 2019-06-02 NOTE — Telephone Encounter (Signed)
Called the pt and scheduled him to see Dr. Meda Coffee for 10/6 at her 4 pm end slot, DOD day. Advised the pt to come 15 mins prior to this appt.  Advised the pt to wear his mask to the appt and during the entire duration of his visit.  Pt verbalized understanding and agrees with this plan. Pt was more than gracious for all the assistance provided.

## 2019-06-08 ENCOUNTER — Encounter: Payer: Self-pay | Admitting: Cardiology

## 2019-06-08 ENCOUNTER — Ambulatory Visit (INDEPENDENT_AMBULATORY_CARE_PROVIDER_SITE_OTHER): Payer: BC Managed Care – PPO | Admitting: Cardiology

## 2019-06-08 ENCOUNTER — Other Ambulatory Visit: Payer: Self-pay

## 2019-06-08 VITALS — BP 118/62 | HR 63 | Ht 63.5 in | Wt 158.2 lb

## 2019-06-08 DIAGNOSIS — I2510 Atherosclerotic heart disease of native coronary artery without angina pectoris: Secondary | ICD-10-CM

## 2019-06-08 DIAGNOSIS — E785 Hyperlipidemia, unspecified: Secondary | ICD-10-CM | POA: Diagnosis not present

## 2019-06-08 NOTE — Progress Notes (Signed)
Cardiology Office Note:    Date:  06/08/2019   ID:  Eric Rhodes, DOB Feb 23, 1956, MRN [id number]  PCP:  Kathyrn Lass, MD  Cardiologist:  Ena Dawley, MD   Electrophysiologist:  None   Referring MD: Kathyrn Lass, MD   Chief complain: Chest pain  History of Present Illness:    Eric Rhodes is a 63 y.o. male with coronary artery disease, GERD/peptic ulcer disease, hypertension, hyperlipidemia, family history of sudden cardiac death.  Nuclear stress test in October 2018 demonstrated no ischemia or scar.  Patient ultimately underwent coronary CTA in May 2019 which demonstrated <30% plaque in the left main, <50% calcific plaque in the proximal and mid LAD, mid LAD myocardial bridging, 50-75% calcific plaque in the ostial D1, <30% calcific plaque in the proximal and mid and distal RCA, <30% calcific plaque in the mid PLB.  The calcium score was 285.  FFR of the diagonal was normal.  He was noted to have 4 beats of nonsustained ventricular tachycardia on his ECG.  Initially, Dr. Meda Coffee recommended referral to electrophysiology.  However, she ultimately decided that this was not necessary.  He has also been followed in our lipid clinic and has been started on evolocumab.     02/23/2019-the patient is coming after 6 months, he states that in the last 10 days he has developed epigastric chest tightness after he eats dinner, since he was diagnosed with moderate nonobstructive CAD he has changed diet, he has lost significant amount of weight now being vegan watching his food portions calories and food quality.  He also exercises at the moderate to high level.  He does not have any chest pain while exercising.  No worsening exercise capacity no shortness of breath.  06/08/2019 -patient is coming for follow-up, he continues to feel great, he rides 40 miles on his bicycle several times a week and is completely asymptomatic.  He now uses combination of Repatha, rosuvastatin and omega-3 fatty acids  for lipid management and he tolerated well.  He denies any other symptoms.   Prior CV studies:   The following studies were reviewed today:  Coronary CTA 01/14/2018 Calcium Score: Calcium noted in LM, LAD and RCA Coronary Arteries: Right dominant with no anomalies LM: Less than 30% calcific plaque LAD: Less than 50% calcific plaque in proximal and mid vessel There also appears to be and area of myocardial bridging in the mid LAD D1: Large vessel with 50-75% calcific plaque at the ostium D2: Normal Circumflex: Normal OM1: Normal OM2: Normal RCA: Less than 30% calcific plaque in the proximal mid and distal vessel PDA: Normal PLA: Less than 30% calcific plaque in the mid PLB IMPRESSION: 1. Calcium Score 285 which is 37 th percentile for age and sex 2.  Normal aortic root 3.3 cm 3. CAD see description above Possible obstructive lesion at the ostium of a large first diagonal Study will be sent for FFR-CT 4.  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 No reversible ischemia or infarction. 2. Normal left ventricular wall motion. 3. Left ventricular ejection fraction 61% 4. Non invasive risk stratification*: Low   Past Medical History:  Diagnosis Date  . BPH (benign prostatic hyperplasia)   .  Chronic abdominal pain   . Chronic insomnia   . GERD (gastroesophageal reflux disease)    diet controlled  . H/O ETOH abuse   . Hernia, inguinal, left   . History of colonoscopy 2004  . History of endoscopy 2004  . Irritable bowel disease   . Prostatitis    Surgical Hx: The patient  has a past surgical history that includes  Anterior cruciate ligament repair (Left, 1978); Colonoscopy; Upper gastrointestinal endoscopy; Wisdom tooth extraction; Inguinal hernia repair (11/07/2011); Hernia repair (Left, 11/2011); Bunionectomy; HEMORRHOIDAL BANDING (10/2014); and COLON MEDOFF.   Current Medications: Current Meds  Medication Sig  . aspirin 81 MG EC tablet Take 81 mg by mouth daily. Swallow whole.  . cetirizine (ZYRTEC) 10 MG tablet Take 10 mg by mouth daily as needed for allergies.   . cholecalciferol (VITAMIN D) 1000 units tablet Take 1,000 Units by mouth daily.  Marland Kitchen co-enzyme Q-10 30 MG capsule Take 30 mg by mouth daily.  . Evolocumab (REPATHA SURECLICK) XX123456 MG/ML SOAJ Inject 1 pen into the skin every 14 (fourteen) days.  Marland Kitchen losartan (COZAAR) 25 MG tablet Take 1 tablet (25 mg total) by mouth daily.  . metoprolol tartrate (LOPRESSOR) 25 MG tablet Take 0.5 tablets (12.5 mg total) by mouth 2 (two) times daily.  . Multiple Vitamin (MULTIVITAMIN) tablet Take 1 tablet daily by mouth.  . nitroGLYCERIN (NITROSTAT) 0.4 MG SL tablet Place 1 tablet (0.4 mg total) under the tongue every 5 (five) minutes x 3 doses as needed for chest pain.  Marland Kitchen nystatin cream (MYCOSTATIN) Apply 1 application topically as needed.  . Omega-3 Fatty Acids (FISH OIL PO) Take 1 capsule by mouth 2 (two) times daily.  . pantoprazole (PROTONIX) 40 MG tablet Take 40 mg by mouth daily.  . rosuvastatin (CRESTOR) 5 MG tablet Take 5 mg by mouth daily.  . tacrolimus (PROTOPIC) 0.1 % ointment Apply 1 application topically daily as needed. For excema  . VENTOLIN HFA 108 (90 Base) MCG/ACT inhaler Use as needed  . vitamin B-12 (CYANOCOBALAMIN) 100 MCG tablet Take 100 mcg by mouth daily.     Allergies:   Augmentin [amoxicillin-pot clavulanate], Betadine [povidone iodine], Clavulanic acid, and Neosporin [neomycin-bacitracin zn-polymyx]   Social History   Tobacco Use  . Smoking status: Never Smoker  . Smokeless tobacco: Never Used  Substance Use Topics  . Alcohol use:  No  . Drug use: No     Family Hx: The patient's family history includes ADD / ADHD in his son and son; COPD in his father; Clotting disorder in his mother; Diabetes in his brother; Heart attack in his brother; Heart disease in his mother; Hypercholesterolemia in his mother; Hypertension in his father and mother; Myelodysplastic syndrome in his mother; Obesity in his brother; Psoriasis in his brother.  ROS:   Please see the history of present illness.    Review of Systems  Cardiovascular: Positive for chest pain and palpitations.  Gastrointestinal: Positive for abdominal pain, constipation and diarrhea.   All other systems reviewed and are negative.   EKGs/Labs/Other Test Reviewed:    EKG:  EKG is  ordered today.  The ekg ordered today demonstrates sinus bradycardia, heart rate 52, normal axis, no acute ST-T wave changes, QTC 385, similar to old EKGs  Recent Labs: 06/26/2018: Magnesium 2.2 02/24/2019: ALT 22; BUN 18; Creatinine, Ser 1.26; Hemoglobin 15.1; Platelets 220; Potassium 4.8; Sodium 141; TSH 2.480   Recent Lipid Panel Lab Results  Component Value Date/Time   CHOL 91 (L) 03/27/2018 07:55  AM   TRIG 43 03/27/2018 07:55 AM   HDL 62 03/27/2018 07:55 AM   CHOLHDL 1.5 03/27/2018 07:55 AM   CHOLHDL 3.1 06/18/2017 04:59 AM   LDLCALC 20 03/27/2018 07:55 AM    Physical Exam:    VS:  BP 118/62   Pulse 63   Ht 5' 3.5" (1.613 m)   Wt 158 lb 3.2 oz (71.8 kg)   SpO2 95%   BMI 27.58 kg/m     Wt Readings from Last 3 Encounters:  06/08/19 158 lb 3.2 oz (71.8 kg)  02/24/19 151 lb 9.6 oz (68.8 kg)  06/26/18 152 lb 12.8 oz (69.3 kg)     Physical Exam  Constitutional: He is oriented to person, place, and time. He appears well-developed and well-nourished. No distress.  HENT:  Head: Normocephalic and atraumatic.  Eyes: No scleral icterus.  Neck: No JVD present. No thyromegaly present.  Cardiovascular: Normal rate and regular rhythm.  No murmur heard. Pulmonary/Chest: Effort  normal. He has no wheezes. He has no rales.  Abdominal: Soft. He exhibits no distension.  Musculoskeletal:        General: No edema.  Lymphadenopathy:    He has no cervical adenopathy.  Neurological: He is alert and oriented to person, place, and time.  Skin: Skin is warm and dry.  Psychiatric: He has a normal mood and affect.    ASSESSMENT & PLAN:     Coronary artery disease involving native coronary artery of native heart without angina pectoris Moderate nonobstructive coronary artery disease by coronary CTA May 2019.  Moderate lesion in ostial portion of a large first diagonal artery however normal FFR.  His chest pain is currently very occasional and postprandial, this is most probably related to history of his ulcers.  He is EKG today is completely normal and unchanged from prior.  He has no chest pain on high level of exercise.  Palpitations Resolved, he had normal 30-day event monitor  Hyperlipidemia, unspecified hyperlipidemia type He is currently on evolocumab, rosuvastatin and omega-3 acids.  He has had excellent response to this combination, in addition he exercises vigorously and continues to have very strict mostly vegan diet.    Essential hypertension The patient's blood pressure is controlled on his current regimen.  Continue current therapy.   Dispo: Follow-up in 1 year  Medication Adjustments/Labs and Tests Ordered: Current medicines are reviewed at length with the patient today.  Concerns regarding medicines are outlined above.  Tests Ordered: Orders Placed This Encounter  Procedures  . EKG 12-Lead   Medication Changes: No orders of the defined types were placed in this encounter.   Signed, Ena Dawley, MD  06/08/2019 8:32 PM    [address] [place], [place],   [address] Phone: [phone]; Fax: [phone]

## 2019-06-08 NOTE — Patient Instructions (Signed)
Medication Instructions:    Your physician recommends that you continue on your current medications as directed. Please refer to the Current Medication list given to you today.  If you need a refill on your cardiac medications before your next appointment, please call your pharmacy.    Follow-Up: At [facility], you and your health needs are our priority.  As part of our continuing mission to provide you with exceptional heart care, we have created designated Provider Care Teams.  These Care Teams include your primary Cardiologist (physician) and Advanced Practice Providers (APPs -  Physician Assistants and Nurse Practitioners) who all work together to provide you with the care you need, when you need it. You will need a follow up appointment in 12 months.  Please call our office 2 months in advance to schedule this appointment.  You may see Ena Dawley, MD or one of the following Advanced Practice Providers on your designated Care Team:   Waggoner, PA-C Melina Copa, PA-C . Ermalinda Barrios, PA-C

## 2019-07-14 DIAGNOSIS — L219 Seborrheic dermatitis, unspecified: Secondary | ICD-10-CM | POA: Diagnosis not present

## 2019-07-14 DIAGNOSIS — D220 Melanocytic nevi of lip: Secondary | ICD-10-CM | POA: Diagnosis not present

## 2019-07-14 DIAGNOSIS — L821 Other seborrheic keratosis: Secondary | ICD-10-CM | POA: Diagnosis not present

## 2019-07-14 DIAGNOSIS — D171 Benign lipomatous neoplasm of skin and subcutaneous tissue of trunk: Secondary | ICD-10-CM | POA: Diagnosis not present

## 2019-08-03 ENCOUNTER — Other Ambulatory Visit: Payer: Self-pay | Admitting: Cardiology

## 2019-10-25 ENCOUNTER — Telehealth: Payer: Self-pay | Admitting: Cardiology

## 2019-10-25 DIAGNOSIS — R002 Palpitations: Secondary | ICD-10-CM

## 2019-10-25 DIAGNOSIS — E785 Hyperlipidemia, unspecified: Secondary | ICD-10-CM

## 2019-10-25 NOTE — Telephone Encounter (Signed)
New message:    Patient calling and would like to speak with you about a refill and a apt. Dr. Janus Molder would like for you to call him.

## 2019-10-25 NOTE — Telephone Encounter (Signed)
I called and left patient a message to call back to discuss refill issues he is having and what he needs filled.

## 2019-10-25 NOTE — Telephone Encounter (Signed)
Patient would like a call back, he states he is having some issues with prescription refills and Dr. Meda Coffee wanted to see him in June or July for a yearly follow up.

## 2019-10-26 MED ORDER — METOPROLOL TARTRATE 25 MG PO TABS: 12.5000 mg | ORAL_TABLET | Freq: Two times a day (BID) | ORAL | 2 refills | Status: DC

## 2019-10-26 MED ORDER — REPATHA SURECLICK 140 MG/ML ~~LOC~~ SOAJ: 1.0000 "pen " | SUBCUTANEOUS | 0 refills | Status: DC

## 2019-10-26 MED ORDER — LOSARTAN POTASSIUM 25 MG PO TABS: 25.0000 mg | ORAL_TABLET | Freq: Every day | ORAL | 2 refills | Status: DC

## 2019-10-26 NOTE — Telephone Encounter (Signed)
Patient returned my phone call, he states that he is currently in [place]. He needs a 90 day supply of repatha sent to the [facility] in [place]. I have refilled the other medications he needed.

## 2019-10-26 NOTE — Telephone Encounter (Signed)
This has been sent in  

## 2019-10-27 NOTE — Telephone Encounter (Signed)
I spoke with patient. He states [facility] did not receive Repatha prescription that was sent in yesterday.  Appointment has already been made for patient to see Dr Meda Coffee in June

## 2019-10-27 NOTE — Telephone Encounter (Signed)
Rx was sent in yesterday, receipt confirmation shown in Epic: "Prescribing Status: Receipt confirmed by pharmacy (10/26/2019 9:36 AM EST)"  Called pharmacy who states they have the rx and there are no issues with it.  Pt is aware.

## 2019-11-04 DIAGNOSIS — L821 Other seborrheic keratosis: Secondary | ICD-10-CM | POA: Diagnosis not present

## 2019-12-20 ENCOUNTER — Other Ambulatory Visit: Payer: Self-pay | Admitting: Cardiology

## 2019-12-24 ENCOUNTER — Telehealth: Payer: Self-pay | Admitting: Cardiology

## 2019-12-24 NOTE — Telephone Encounter (Signed)
Prior auth approved through 12/23/20. Patient made aware

## 2019-12-24 NOTE — Telephone Encounter (Signed)
HTA ID: [id number] [id number] PCN: [id number]  Prior auth sent to plan. Will call patient once approved

## 2019-12-24 NOTE — Telephone Encounter (Signed)
Pt c/o medication issue:  1. Name of Medication: REPATHA SURECLICK [id number] MG/ML SOAJ  2. How are you currently taking this medication (dosage and times per day)?   3. Are you having a reaction (difficulty breathing--STAT)?   4. What is your medication issue? Eric Rhodes is calling stating he switched insurances and they are stating prior authorization is needed for this medication. Please advise.

## 2020-01-17 DIAGNOSIS — I10 Essential (primary) hypertension: Secondary | ICD-10-CM | POA: Diagnosis not present

## 2020-01-17 DIAGNOSIS — E7800 Pure hypercholesterolemia, unspecified: Secondary | ICD-10-CM | POA: Diagnosis not present

## 2020-01-17 DIAGNOSIS — I2510 Atherosclerotic heart disease of native coronary artery without angina pectoris: Secondary | ICD-10-CM | POA: Diagnosis not present

## 2020-01-17 DIAGNOSIS — Z6827 Body mass index (BMI) 27.0-27.9, adult: Secondary | ICD-10-CM | POA: Diagnosis not present

## 2020-01-17 DIAGNOSIS — Z0000 Encounter for general adult medical examination without abnormal findings: Secondary | ICD-10-CM | POA: Diagnosis not present

## 2020-01-17 DIAGNOSIS — F1021 Alcohol dependence, in remission: Secondary | ICD-10-CM | POA: Diagnosis not present

## 2020-01-17 DIAGNOSIS — E663 Overweight: Secondary | ICD-10-CM | POA: Diagnosis not present

## 2020-01-17 DIAGNOSIS — Z23 Encounter for immunization: Secondary | ICD-10-CM | POA: Diagnosis not present

## 2020-01-24 DIAGNOSIS — I2510 Atherosclerotic heart disease of native coronary artery without angina pectoris: Secondary | ICD-10-CM | POA: Diagnosis not present

## 2020-01-24 DIAGNOSIS — Z0000 Encounter for general adult medical examination without abnormal findings: Secondary | ICD-10-CM | POA: Diagnosis not present

## 2020-01-24 DIAGNOSIS — Z23 Encounter for immunization: Secondary | ICD-10-CM | POA: Diagnosis not present

## 2020-01-24 DIAGNOSIS — E7800 Pure hypercholesterolemia, unspecified: Secondary | ICD-10-CM | POA: Diagnosis not present

## 2020-01-24 DIAGNOSIS — Z6827 Body mass index (BMI) 27.0-27.9, adult: Secondary | ICD-10-CM | POA: Diagnosis not present

## 2020-01-24 DIAGNOSIS — I10 Essential (primary) hypertension: Secondary | ICD-10-CM | POA: Diagnosis not present

## 2020-02-02 DIAGNOSIS — D2210 Melanocytic nevi of unspecified eyelid, including canthus: Secondary | ICD-10-CM | POA: Diagnosis not present

## 2020-02-02 DIAGNOSIS — D220 Melanocytic nevi of lip: Secondary | ICD-10-CM | POA: Diagnosis not present

## 2020-02-02 DIAGNOSIS — L309 Dermatitis, unspecified: Secondary | ICD-10-CM | POA: Diagnosis not present

## 2020-02-02 DIAGNOSIS — L821 Other seborrheic keratosis: Secondary | ICD-10-CM | POA: Diagnosis not present

## 2020-02-25 ENCOUNTER — Ambulatory Visit: Payer: PPO | Admitting: Cardiology

## 2020-02-25 ENCOUNTER — Other Ambulatory Visit: Payer: Self-pay

## 2020-02-25 ENCOUNTER — Encounter: Payer: Self-pay | Admitting: Cardiology

## 2020-02-25 VITALS — BP 118/74 | HR 64 | Ht 63.5 in | Wt 163.8 lb

## 2020-02-25 DIAGNOSIS — I2510 Atherosclerotic heart disease of native coronary artery without angina pectoris: Secondary | ICD-10-CM

## 2020-02-25 DIAGNOSIS — E785 Hyperlipidemia, unspecified: Secondary | ICD-10-CM | POA: Diagnosis not present

## 2020-02-25 NOTE — Progress Notes (Signed)
Cardiology Office Note:    Date:  02/25/2020   ID:  Eric Rhodes, DOB 1956-07-29, MRN [id number]  PCP:  Kathyrn Lass, MD  Cardiologist:  Ena Dawley, MD   Electrophysiologist:  None   Referring MD: Kathyrn Lass, MD   Reason for visit: 8 months follow-up  History of Present Illness:    Eric Rhodes is a 64 y.o. male with moderate nonobstructive coronary artery disease, GERD/peptic ulcer disease, hypertension, hyperlipidemia, family history of sudden cardiac death.  Nuclear stress test in October 2018 demonstrated no ischemia or scar.  Patient ultimately underwent coronary CTA in May 2019 which demonstrated <30% plaque in the left main, <50% calcific plaque in the proximal and mid LAD, mid LAD myocardial bridging, 50-75% calcific plaque in the ostial D1, <30% calcific plaque in the proximal and mid and distal RCA, <30% calcific plaque in the mid PLB.  The calcium score was 285.  FFR of the diagonal was normal.  He was noted to have 4 beats of nonsustained ventricular tachycardia on his ECG.  Initially, Dr. Meda Coffee recommended referral to electrophysiology.  However, she ultimately decided that this was not necessary.  He has also been followed in our lipid clinic and has been started on evolocumab.     02/23/2019-the patient is coming after 6 months, he states that in the last 10 days he has developed epigastric chest tightness after he eats dinner, since he was diagnosed with moderate nonobstructive CAD he has changed diet, he has lost significant amount of weight now being vegan watching his food portions calories and food quality.  He also exercises at the moderate to high level.  He does not have any chest pain while exercising.  No worsening exercise capacity no shortness of breath.  02/25/2020 -the patient is coming after 8 months, he feels and looks great, he continues to go to gym 6-7 times a week and bikes 40 miles on the road bike several times a week.  He has absolutely no  symptoms.  No chest pain or shortness of breath, he has also started to lift some weights.  He has no signs of orthostatic hypotension no dizziness, no lower extremity edema.  He has been compliant with his medication and has no side effects.   Prior CV studies:   The following studies were reviewed today:  Coronary CTA 01/14/2018 Calcium Score: Calcium noted in LM, LAD and RCA Coronary Arteries: Right dominant with no anomalies LM: Less than 30% calcific plaque LAD: Less than 50% calcific plaque in proximal and mid vessel There also appears to be and area of myocardial bridging in the mid LAD D1: Large vessel with 50-75% calcific plaque at the ostium D2: Normal Circumflex: Normal OM1: Normal OM2: Normal RCA: Less than 30% calcific plaque in the proximal mid and distal vessel PDA: Normal PLA: Less than 30% calcific plaque in the mid PLB IMPRESSION: 1. Calcium Score 285 which is 26 th percentile for age and sex 2.  Normal aortic root 3.3 cm 3. CAD see description above Possible obstructive lesion at the ostium of a large first diagonal Study will be sent for FFR-CT 4.  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 No  reversible ischemia or infarction. 2. Normal left ventricular wall motion. 3. Left ventricular ejection fraction 61% 4. Non invasive risk stratification*: Low   Past Medical History:  Diagnosis Date  . BPH (benign prostatic hyperplasia)   . Chronic abdominal pain   . Chronic insomnia   . GERD (gastroesophageal reflux disease)    diet controlled  . H/O ETOH abuse   . Hernia, inguinal, left   . History of  colonoscopy 2004  . History of endoscopy 2004  . Irritable bowel disease   . Prostatitis    Surgical Hx: The patient  has a past surgical history that includes Anterior cruciate ligament repair (Left, 1978); Colonoscopy; Upper gastrointestinal endoscopy; Wisdom tooth extraction; Inguinal hernia repair (11/07/2011); Hernia repair (Left, 11/2011); Bunionectomy; HEMORRHOIDAL BANDING (10/2014); and COLON MEDOFF.   Current Medications: Current Meds  Medication Sig  . aspirin 81 MG EC tablet Take 81 mg by mouth daily. Swallow whole.  . cetirizine (ZYRTEC) 10 MG tablet Take 10 mg by mouth daily as needed for allergies.   . cholecalciferol (VITAMIN D) 1000 units tablet Take 1,000 Units by mouth daily.  Marland Kitchen co-enzyme Q-10 30 MG capsule Take 30 mg by mouth daily.  Marland Kitchen losartan (COZAAR) 25 MG tablet Take 1 tablet (25 mg total) by mouth daily.  . metoprolol tartrate (LOPRESSOR) 25 MG tablet Take 0.5 tablets (12.5 mg total) by mouth 2 (two) times daily.  . Multiple Vitamin (MULTIVITAMIN) tablet Take 1 tablet daily by mouth.  . nystatin cream (MYCOSTATIN) Apply 1 application topically as needed.  . Omega-3 Fatty Acids (FISH OIL PO) Take 1 capsule by mouth 2 (two) times daily.  Marland Kitchen REPATHA SURECLICK 371 MG/ML SOAJ INJECT THE CONTENTS OF 1 SYRINGE SUBCUTANEOUSLY EVERY 14 DAYS  . rosuvastatin (CRESTOR) 5 MG tablet Take 5 mg by mouth daily.  . tacrolimus (PROTOPIC) 0.1 % ointment Apply 1 application topically daily as needed. For excema  . VENTOLIN HFA 108 (90 Base) MCG/ACT inhaler Use as needed  . vitamin B-12 (CYANOCOBALAMIN) 100 MCG tablet Take 100 mcg by mouth daily.     Allergies:   Augmentin [amoxicillin-pot clavulanate], Betadine [povidone iodine], Clavulanic acid, and Neosporin [neomycin-bacitracin zn-polymyx]   Social History   Tobacco Use  . Smoking status: Never Smoker  . Smokeless tobacco: Never Used  Vaping Use  . Vaping Use: Never used  Substance Use Topics  . Alcohol use: No  . Drug use:  No     Family Hx: The patient's family history includes ADD / ADHD in his son and son; COPD in his father; Clotting disorder in his mother; Diabetes in his brother; Heart attack in his brother; Heart disease in his mother; Hypercholesterolemia in his mother; Hypertension in his father and mother; Myelodysplastic syndrome in his mother; Obesity in his brother; Psoriasis in his brother.  ROS:   Please see the history of present illness.    Review of Systems  Cardiovascular: Positive for chest pain and palpitations.  Gastrointestinal: Positive for abdominal pain, constipation and diarrhea.   All other systems reviewed and are negative.   EKGs/Labs/Other Test Reviewed:    EKG:  EKG is  ordered today.  The ekg ordered today demonstrates sinus bradycardia, heart rate 52, normal axis, no acute ST-T wave changes, QTC 385, similar to old EKGs  Recent Labs: No results found for requested labs within last 8760 hours.   Recent Lipid Panel Lab Results  Component Value Date/Time   CHOL 91 (L) 03/27/2018 07:55 AM   TRIG 43 03/27/2018  07:55 AM   HDL 62 03/27/2018 07:55 AM   CHOLHDL 1.5 03/27/2018 07:55 AM   CHOLHDL 3.1 06/18/2017 04:59 AM   LDLCALC 20 03/27/2018 07:55 AM    Physical Exam:    VS:  BP 118/74   Pulse 64   Ht 5' 3.5" (1.613 m)   Wt 163 lb 12.8 oz (74.3 kg)   SpO2 97%   BMI 28.56 kg/m     Wt Readings from Last 3 Encounters:  02/25/20 163 lb 12.8 oz (74.3 kg)  06/08/19 158 lb 3.2 oz (71.8 kg)  02/24/19 151 lb 9.6 oz (68.8 kg)     Physical Exam Constitutional:      General: He is not in acute distress.    Appearance: He is well-developed.  HENT:     Head: Normocephalic and atraumatic.  Eyes:     General: No scleral icterus. Neck:     Thyroid: No thyromegaly.     Vascular: No JVD.  Cardiovascular:     Rate and Rhythm: Normal rate and regular rhythm.     Heart sounds: No murmur heard.   Pulmonary:     Effort: Pulmonary effort is normal.     Breath sounds: No  wheezing or rales.  Abdominal:     General: There is no distension.     Palpations: Abdomen is soft.  Lymphadenopathy:     Cervical: No cervical adenopathy.  Skin:    General: Skin is warm and dry.  Neurological:     Mental Status: He is alert and oriented to person, place, and time.     ASSESSMENT & PLAN:    Coronary artery disease involving native coronary artery of native heart without angina pectoris Moderate nonobstructive coronary artery disease by coronary CTA May 2019.  Moderate lesion in ostial portion of a large first diagonal artery however normal FFR.  He symptoms were related to GI issues, since he changed his diet he is completely asymptomatic.  We will continue primary CAD prevention with aspirin, Crestor, Repatha, rosuvastatin and metoprolol.  Palpitations Resolved, he had normal 30-day event monitor, continue metoprolol.  Hyperlipidemia, unspecified hyperlipidemia type He is currently on evolocumab, rosuvastatin and omega-3 acids.  He has had excellent response to this combination, in addition he exercises vigorously and continues to have very strict mostly vegan diet.    Essential hypertension The patient's blood pressure is controlled on his current regimen.  Continue current therapy.  No symptoms of orthostatic hypotension.  Dispo: Follow-up in 1 year  Medication Adjustments/Labs and Tests Ordered: Current medicines are reviewed at length with the patient today.  Concerns regarding medicines are outlined above.  Tests Ordered: Orders Placed This Encounter  Procedures  . EKG 12-Lead   Medication Changes: No orders of the defined types were placed in this encounter.   Signed, Ena Dawley, MD  02/25/2020 9:38 AM    [address], [place], [place]  [address] Phone: [phone]; Fax: [phone]

## 2020-02-25 NOTE — Patient Instructions (Signed)
Medication Instructions:  ° °Your physician recommends that you continue on your current medications as directed. Please refer to the Current Medication list given to you today. ° °*If you need a refill on your cardiac medications before your next appointment, please call your pharmacy* ° °Follow-Up: °At [facility], you and your health needs are our priority.  As part of our continuing mission to provide you with exceptional heart care, we have created designated Provider Care Teams.  These Care Teams include your primary Cardiologist (physician) and Advanced Practice Providers (APPs -  Physician Assistants and Nurse Practitioners) who all work together to provide you with the care you need, when you need it. ° °We recommend signing up for the patient portal called "MyChart".  Sign up information is provided on this After Visit Summary.  MyChart is used to connect with patients for Virtual Visits (Telemedicine).  Patients are able to view lab/test results, encounter notes, upcoming appointments, etc.  Non-urgent messages can be sent to your provider as well.   °To learn more about what you can do with MyChart, go to [email or web address].   ° °Your next appointment:   °12 month(s) ° °The format for your next appointment:   °In Person ° °Provider:   °Katarina Nelson, MD ° ° ° ° °

## 2020-03-08 DIAGNOSIS — N400 Enlarged prostate without lower urinary tract symptoms: Secondary | ICD-10-CM | POA: Diagnosis not present

## 2020-03-08 DIAGNOSIS — N522 Drug-induced erectile dysfunction: Secondary | ICD-10-CM | POA: Diagnosis not present

## 2020-03-24 DIAGNOSIS — M25521 Pain in right elbow: Secondary | ICD-10-CM | POA: Diagnosis not present

## 2020-03-27 ENCOUNTER — Other Ambulatory Visit: Payer: Self-pay | Admitting: Orthopedic Surgery

## 2020-03-27 DIAGNOSIS — M25521 Pain in right elbow: Secondary | ICD-10-CM

## 2020-03-28 ENCOUNTER — Ambulatory Visit
Admission: RE | Admit: 2020-03-28 | Discharge: 2020-03-28 | Disposition: A | Discharge status: Home / Self Care | Payer: PPO | Source: Ambulatory Visit | Attending: Orthopedic Surgery | Admitting: Orthopedic Surgery

## 2020-03-28 ENCOUNTER — Other Ambulatory Visit: Payer: Self-pay

## 2020-03-28 DIAGNOSIS — M25421 Effusion, right elbow: Secondary | ICD-10-CM | POA: Diagnosis not present

## 2020-03-28 DIAGNOSIS — M19021 Primary osteoarthritis, right elbow: Secondary | ICD-10-CM | POA: Diagnosis not present

## 2020-03-28 DIAGNOSIS — M25521 Pain in right elbow: Secondary | ICD-10-CM

## 2020-03-28 DIAGNOSIS — S56511A Strain of other extensor muscle, fascia and tendon at forearm level, right arm, initial encounter: Secondary | ICD-10-CM | POA: Diagnosis not present

## 2020-03-28 DIAGNOSIS — M8548 Solitary bone cyst, other site: Secondary | ICD-10-CM | POA: Diagnosis not present

## 2020-03-28 IMAGING — MR MR ELBOW*R* W/O CM
4 of 5 series · 25 of 40 positions shown · non-contrast
Comparison: None.

CLINICAL DATA: Pain over the olecranon process and medial
epicondyle after falling on [DATE]. No previous relevant
surgery.

EXAM:
MRI OF THE RIGHT ELBOW WITHOUT CONTRAST
TECHNIQUE: Multiplanar, multisequence MR imaging of the elbow was performed. No
intravenous contrast was administered.

[Series 7: T1 · axial · 3.0mm · 0.23mm/px · z∈[-63,+14]mm · 3 of 28 slices shown]
[im 4/28]
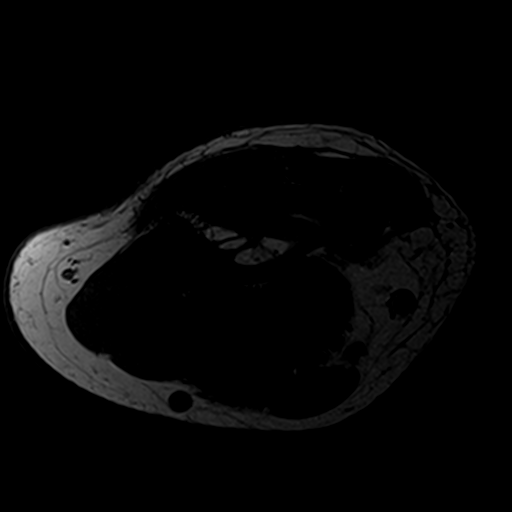
[im 16/28]
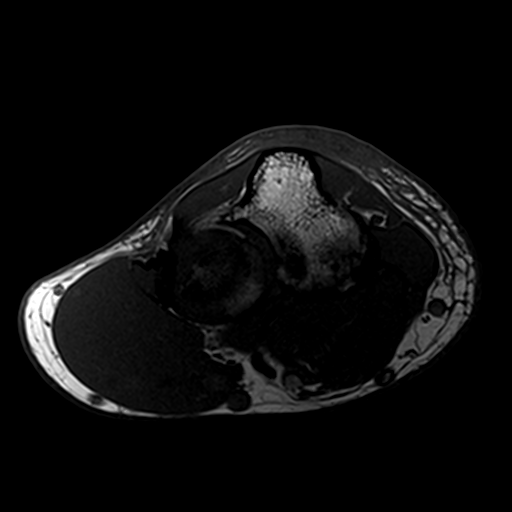
[im 24/28]
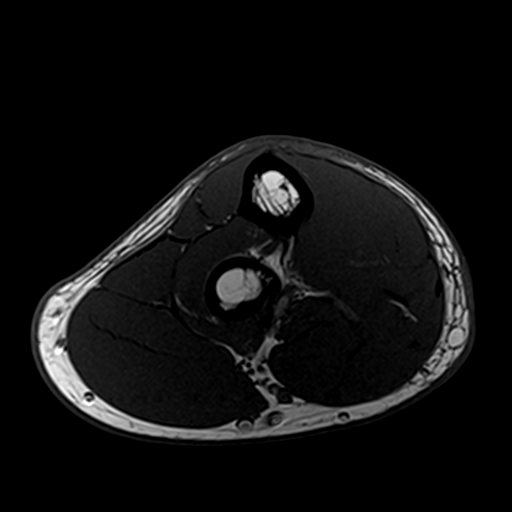

[Series 8: T2 fat-sat · axial · 3.0mm · 0.23mm/px · z∈[-74,+29]mm · 9 of 28 slices shown (1 of 2)]
[im 1/28]
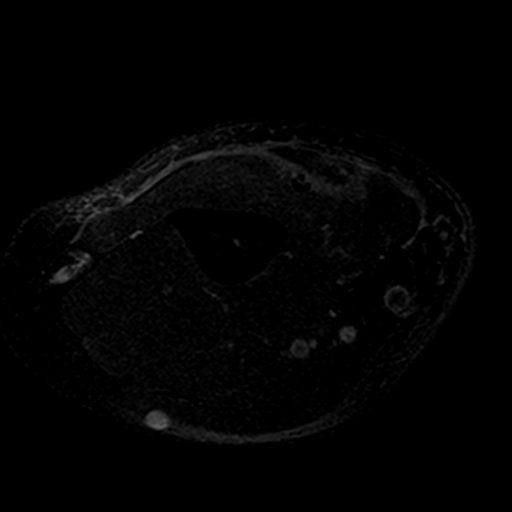
[im 4/28]
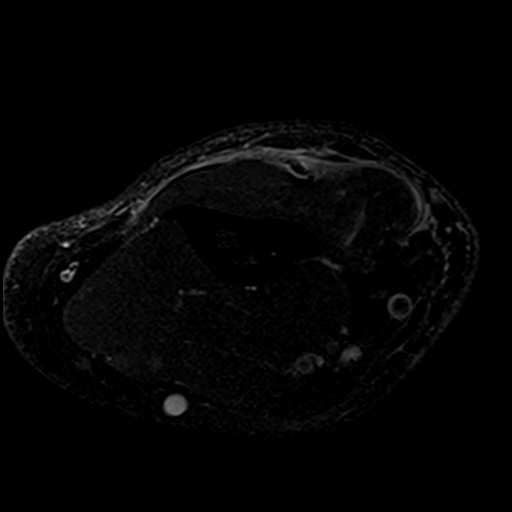
[im 7/28]
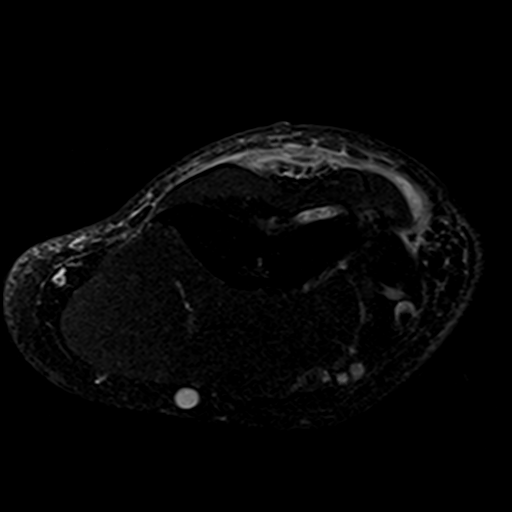
[im 11/28]
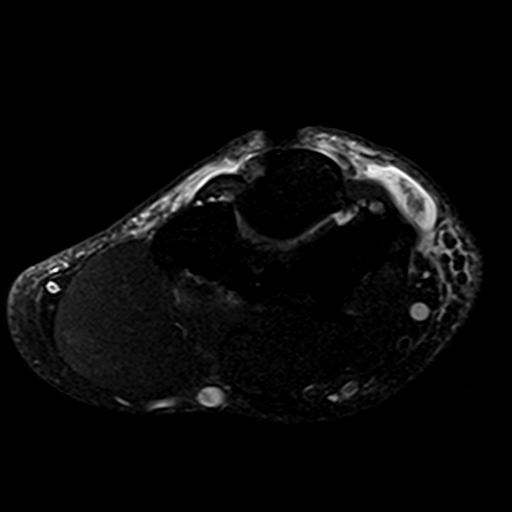
[im 14/28]
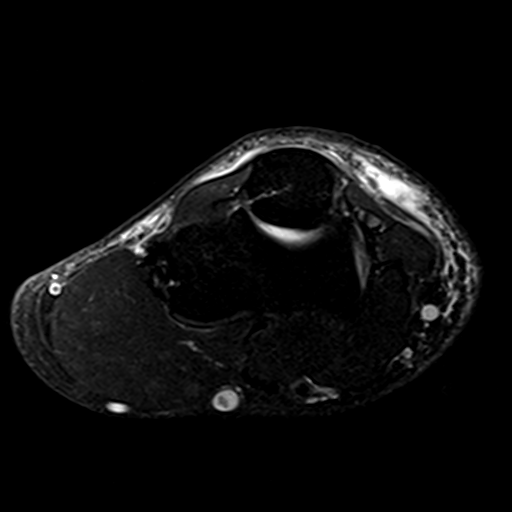
[im 17/28]
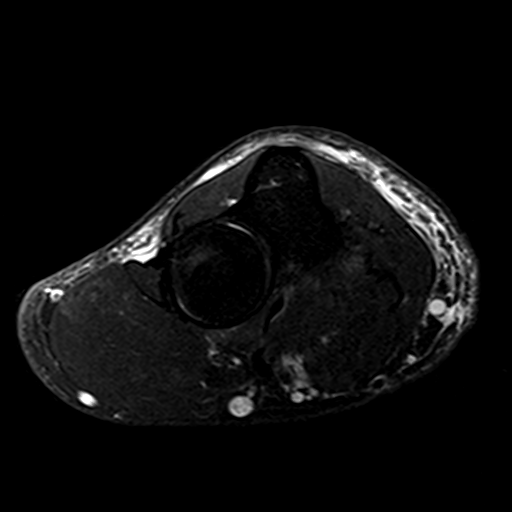
[im 21/28]
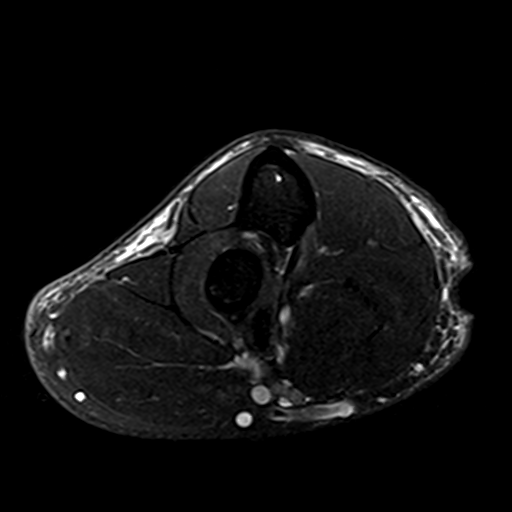
[im 24/28]
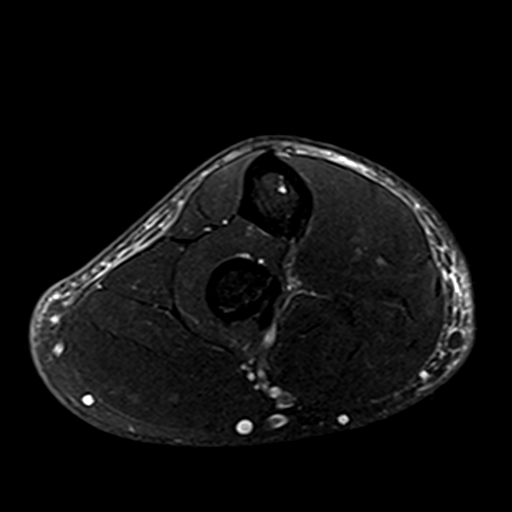
[im 28/28]
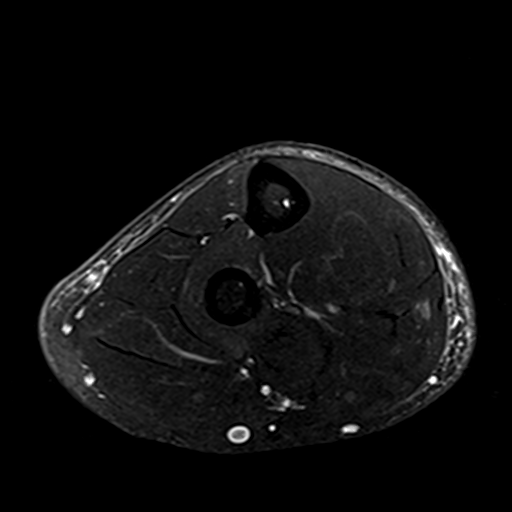

[Series 9: T2 fat-sat · coronal · 3.0mm · 0.27mm/px · 4 of 20 slices shown (2 of 2)]
[im 1/20]
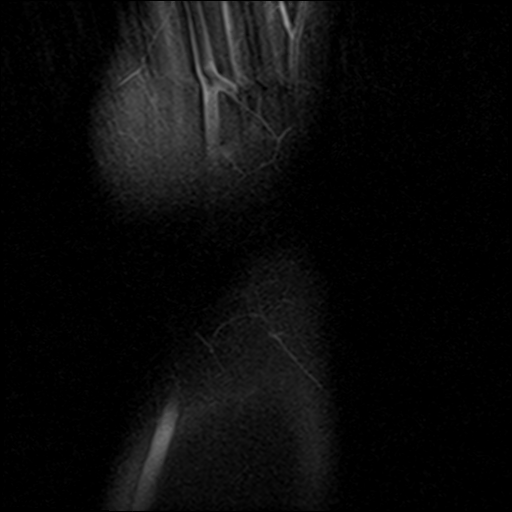
[im 4/20]
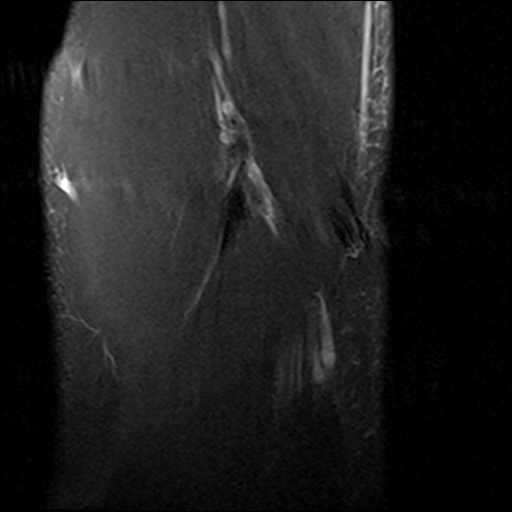
[im 10/20]
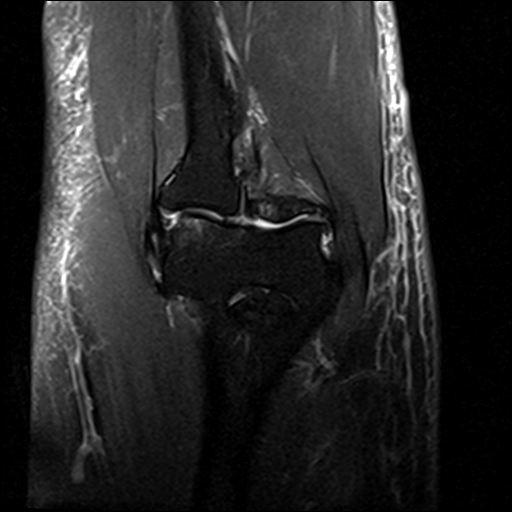
[im 16/20]
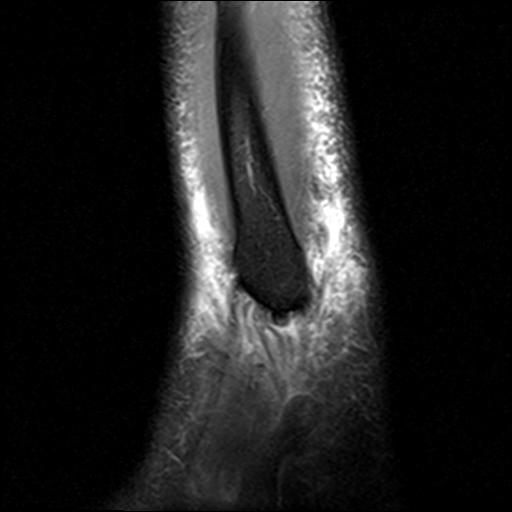

[Series 11: PD fat-sat · sagittal · 3.0mm · 0.55mm/px · 9 of 26 slices shown]
[im 1/26]
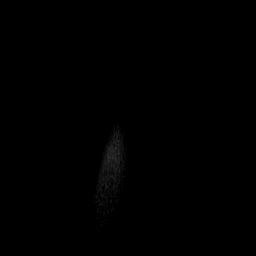
[im 4/26]
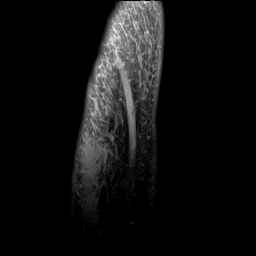
[im 7/26]
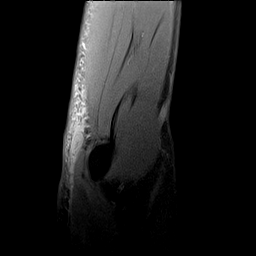
[im 10/26]
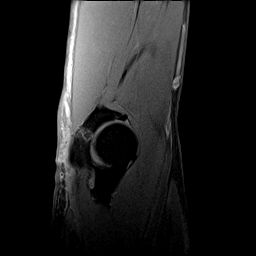
[im 13/26]
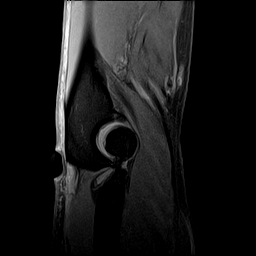
[im 16/26]
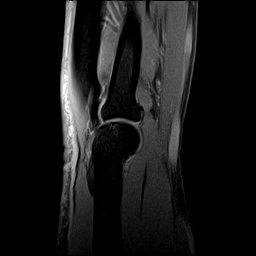
[im 19/26]
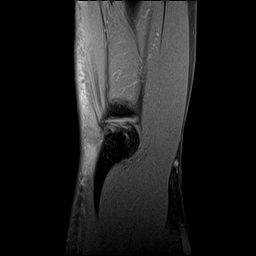
[im 22/26]
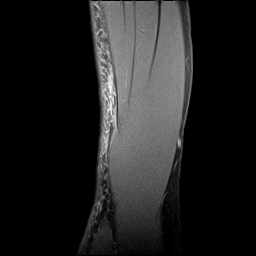
[im 26/26]
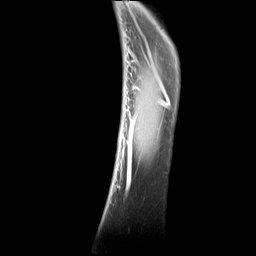

[25 of 40 positions shown; findings below may reference images not displayed]

FINDINGS: Capsules were placed over the olecranon process and medial
epicondylar region.

TENDONS

Common forearm flexor origin: Mild tendinosis without tear.

Common forearm extensor origin: Mild tendinosis with partial
intrasubstance tearing. No significant tendon retraction.

Biceps: Intact.

Triceps: There is complete rupture of the superficial portion of the
distal triceps tendon (lateral and long heads). The tendon appears
retracted approximately 2 cm on the coronal and sagittal images. The
deep tendon and muscular insertion of the medial head are intact. No
obvious associated avulsion fracture.

LIGAMENTS

Medial stabilizers: Intact.

Lateral stabilizers: The lateral ulnar and radial collateral
ligaments appear intact.

Cartilage: Moderate ulnohumeral and radiocapitellar degenerative
changes with chondral thinning and osteophytes. There is subchondral
cyst formation in the coronoid process and posterior aspect of the
capitellum.

Joint: Small joint effusion. No definite intra-articular loose body.

Cubital tunnel: Unremarkable.  The ulnar nerve appears normal.

Bones: No evidence of acute fracture or dislocation.

Other: Dorsal and medial subcutaneous edema with probable small
hematoma measuring up to 2.0 cm on image [DATE]. No evidence of
foreign body.
IMPRESSION: 1. Complete rupture of the superficial portion of the distal triceps
tendon with approximately 2 cm of retraction and posterior
subcutaneous edema/hemorrhage.
2. Mild common flexor and extensor tendinosis without tear.
3. Moderate ulnohumeral and radiocapitellar degenerative changes. No
definite acute osseous findings. Recommend plain film correlation.

## 2020-03-29 DIAGNOSIS — M25521 Pain in right elbow: Secondary | ICD-10-CM | POA: Diagnosis not present

## 2020-03-30 ENCOUNTER — Telehealth: Payer: Self-pay | Admitting: Cardiology

## 2020-03-30 NOTE — Telephone Encounter (Signed)
° °  Primary Cardiologist: Ena Dawley, MD  Chart reviewed as part of pre-operative protocol coverage. Given past medical history and time since last visit, based on ACC/AHA guidelines, Payson Evrard Segovia would be at acceptable risk for the planned procedure without further cardiovascular testing.   I will route this recommendation to the requesting party via Epic fax function and remove from pre-op pool.  Please call with questions.  Jossie Ng. Khalilah Hoke NP-C    03/30/2020, 11:23 AM [address] Office [phone] Fax [phone]

## 2020-03-30 NOTE — Telephone Encounter (Signed)
There are 2 message-see other clearance note

## 2020-03-30 NOTE — Telephone Encounter (Signed)
Patient is calling to make Dr. Meda Coffee aware that he tore a tendon in his triceps and he is scheduled to have an emergency procedure on 04/03/20 with Dr. Tamera Punt of [facility]. In addition, he states their office faxed a request for cardiac clearance.

## 2020-03-30 NOTE — Telephone Encounter (Signed)
     [facility] Pre-operative Risk Assessment    HEARTCARE STAFF: - Please ensure there is not already an duplicate clearance open for this procedure. - Under Visit Info/Reason for Call, type in Other and utilize the format Clearance MM/DD/YY or Clearance TBD. Do not use dashes or single digits. - If request is for dental extraction, please clarify the # of teeth to be extracted.  Request for surgical clearance:  1. What type of surgery is being performed? Right elbow tricep repair  2. When is this surgery scheduled? 04/04/20   3. What type of clearance is required (medical clearance vs. Pharmacy clearance to hold med vs. Both)? both  4. Are there any medications that need to be held prior to surgery and how long? Not sure   5. Practice name and name of physician performing surgery? Dr Tamera Punt, [facility]  6. What is the office phone number? [phone]   7.   What is the office fax number? [phone]  8.   Anesthesia type (None, local, MAC, general) ? Not decided yet   Eric Rhodes 03/30/2020, 11:05 AM  _________________________________________________________________   (provider comments below)

## 2020-04-04 DIAGNOSIS — Y999 Unspecified external cause status: Secondary | ICD-10-CM | POA: Diagnosis not present

## 2020-04-04 DIAGNOSIS — G8918 Other acute postprocedural pain: Secondary | ICD-10-CM | POA: Diagnosis not present

## 2020-04-04 DIAGNOSIS — S46311A Strain of muscle, fascia and tendon of triceps, right arm, initial encounter: Secondary | ICD-10-CM | POA: Diagnosis not present

## 2020-04-04 DIAGNOSIS — X58XXXA Exposure to other specified factors, initial encounter: Secondary | ICD-10-CM | POA: Diagnosis not present

## 2020-04-04 MED FILL — tiZANidine HCL 2 MG TABS: 2 | 7 days supply | Qty: 20 | Fill #0

## 2020-04-04 MED FILL — OXYCODONE-APAP 5-325MG: 5-325 | 5 days supply | Qty: 30 | Fill #0

## 2020-04-06 DIAGNOSIS — Z9889 Other specified postprocedural states: Secondary | ICD-10-CM | POA: Diagnosis not present

## 2020-04-09 ENCOUNTER — Other Ambulatory Visit: Payer: PPO

## 2020-04-14 DIAGNOSIS — S46311A Strain of muscle, fascia and tendon of triceps, right arm, initial encounter: Secondary | ICD-10-CM | POA: Diagnosis not present

## 2020-04-14 DIAGNOSIS — Z9889 Other specified postprocedural states: Secondary | ICD-10-CM | POA: Diagnosis not present

## 2020-05-05 DIAGNOSIS — Z9889 Other specified postprocedural states: Secondary | ICD-10-CM | POA: Diagnosis not present

## 2020-05-19 DIAGNOSIS — Z9889 Other specified postprocedural states: Secondary | ICD-10-CM | POA: Diagnosis not present

## 2020-05-19 DIAGNOSIS — L03113 Cellulitis of right upper limb: Secondary | ICD-10-CM | POA: Diagnosis not present

## 2020-05-20 ENCOUNTER — Other Ambulatory Visit: Payer: Self-pay

## 2020-05-20 ENCOUNTER — Inpatient Hospital Stay (HOSPITAL_COMMUNITY)
Admission: EM | Admit: 2020-05-20 | Discharge: 2020-05-23 | DRG: 857 | Disposition: A | Discharge status: Home / Self Care | Payer: PPO | Source: Ambulatory Visit | Attending: Orthopedic Surgery | Admitting: Orthopedic Surgery

## 2020-05-20 ENCOUNTER — Encounter (HOSPITAL_COMMUNITY): Payer: Self-pay | Admitting: Emergency Medicine

## 2020-05-20 DIAGNOSIS — T8149XA Infection following a procedure, other surgical site, initial encounter: Secondary | ICD-10-CM | POA: Diagnosis not present

## 2020-05-20 DIAGNOSIS — F1010 Alcohol abuse, uncomplicated: Secondary | ICD-10-CM | POA: Diagnosis present

## 2020-05-20 DIAGNOSIS — G8929 Other chronic pain: Secondary | ICD-10-CM | POA: Diagnosis present

## 2020-05-20 DIAGNOSIS — Z8249 Family history of ischemic heart disease and other diseases of the circulatory system: Secondary | ICD-10-CM

## 2020-05-20 DIAGNOSIS — K219 Gastro-esophageal reflux disease without esophagitis: Secondary | ICD-10-CM | POA: Diagnosis not present

## 2020-05-20 DIAGNOSIS — Z9109 Other allergy status, other than to drugs and biological substances: Secondary | ICD-10-CM | POA: Diagnosis not present

## 2020-05-20 DIAGNOSIS — T8140XA Infection following a procedure, unspecified, initial encounter: Principal | ICD-10-CM | POA: Diagnosis present

## 2020-05-20 DIAGNOSIS — Z20822 Contact with and (suspected) exposure to covid-19: Secondary | ICD-10-CM | POA: Diagnosis not present

## 2020-05-20 DIAGNOSIS — L03113 Cellulitis of right upper limb: Secondary | ICD-10-CM | POA: Diagnosis present

## 2020-05-20 DIAGNOSIS — M25521 Pain in right elbow: Secondary | ICD-10-CM | POA: Diagnosis not present

## 2020-05-20 DIAGNOSIS — F419 Anxiety disorder, unspecified: Secondary | ICD-10-CM | POA: Diagnosis not present

## 2020-05-20 DIAGNOSIS — Z888 Allergy status to other drugs, medicaments and biological substances status: Secondary | ICD-10-CM

## 2020-05-20 DIAGNOSIS — F5104 Psychophysiologic insomnia: Secondary | ICD-10-CM | POA: Diagnosis not present

## 2020-05-20 DIAGNOSIS — Z832 Family history of diseases of the blood and blood-forming organs and certain disorders involving the immune mechanism: Secondary | ICD-10-CM | POA: Diagnosis not present

## 2020-05-20 DIAGNOSIS — K589 Irritable bowel syndrome without diarrhea: Secondary | ICD-10-CM | POA: Diagnosis not present

## 2020-05-20 DIAGNOSIS — E785 Hyperlipidemia, unspecified: Secondary | ICD-10-CM | POA: Diagnosis not present

## 2020-05-20 DIAGNOSIS — M009 Pyogenic arthritis, unspecified: Secondary | ICD-10-CM | POA: Diagnosis not present

## 2020-05-20 DIAGNOSIS — I2510 Atherosclerotic heart disease of native coronary artery without angina pectoris: Secondary | ICD-10-CM | POA: Diagnosis present

## 2020-05-20 DIAGNOSIS — Z83438 Family history of other disorder of lipoprotein metabolism and other lipidemia: Secondary | ICD-10-CM | POA: Diagnosis not present

## 2020-05-20 DIAGNOSIS — Z825 Family history of asthma and other chronic lower respiratory diseases: Secondary | ICD-10-CM | POA: Diagnosis not present

## 2020-05-20 DIAGNOSIS — Z833 Family history of diabetes mellitus: Secondary | ICD-10-CM | POA: Diagnosis not present

## 2020-05-20 DIAGNOSIS — Z7982 Long term (current) use of aspirin: Secondary | ICD-10-CM

## 2020-05-20 DIAGNOSIS — N400 Enlarged prostate without lower urinary tract symptoms: Secondary | ICD-10-CM | POA: Diagnosis not present

## 2020-05-20 DIAGNOSIS — Z79899 Other long term (current) drug therapy: Secondary | ICD-10-CM

## 2020-05-20 DIAGNOSIS — N419 Inflammatory disease of prostate, unspecified: Secondary | ICD-10-CM | POA: Diagnosis present

## 2020-05-20 DIAGNOSIS — R109 Unspecified abdominal pain: Secondary | ICD-10-CM | POA: Diagnosis present

## 2020-05-20 LAB — COMPREHENSIVE METABOLIC PANEL
ALT: 21 U/L (ref 0–44)
AST: 17 U/L (ref 15–41)
Albumin: 3.8 g/dL (ref 3.5–5.0)
Alkaline Phosphatase: 47 U/L (ref 38–126)
Anion gap: 12 (ref 5–15)
BUN: 23 mg/dL (ref 8–23)
CO2: 27 mmol/L (ref 22–32)
Calcium: 9.4 mg/dL (ref 8.9–10.3)
Chloride: 98 mmol/L (ref 98–111)
Creatinine, Ser: 1.34 mg/dL — ABNORMAL HIGH (ref 0.61–1.24)
GFR calc Af Amer: >60 mL/min (ref >60)
GFR calc non Af Amer: 55 mL/min — ABNORMAL LOW (ref >60)
Glucose, Bld: 131 mg/dL — ABNORMAL HIGH (ref 70–99)
Potassium: 4.4 mmol/L (ref 3.5–5.1)
Sodium: 137 mmol/L (ref 135–145)
Total Bilirubin: 1 mg/dL (ref 0.3–1.2)
Total Protein: 6.9 g/dL (ref 6.5–8.1)

## 2020-05-20 LAB — CBC WITH DIFFERENTIAL/PLATELET
Abs Immature Granulocytes: 0.11 10*3/uL — ABNORMAL HIGH (ref 0.00–0.07)
Basophils Absolute: 0 10*3/uL (ref 0.0–0.1)
Basophils Relative: 0 %
Eosinophils Absolute: 0.2 10*3/uL (ref 0.0–0.5)
Eosinophils Relative: 2 %
HCT: 44.3 % (ref 39.0–52.0)
Hemoglobin: 14.5 g/dL (ref 13.0–17.0)
Immature Granulocytes: 1 %
Lymphocytes Relative: 15 %
Lymphs Abs: 1.4 10*3/uL (ref 0.7–4.0)
MCH: 30 pg (ref 26.0–34.0)
MCHC: 32.7 g/dL (ref 30.0–36.0)
MCV: 91.7 fL (ref 80.0–100.0)
Monocytes Absolute: 0.7 10*3/uL (ref 0.1–1.0)
Monocytes Relative: 8 %
Neutro Abs: 6.7 10*3/uL (ref 1.7–7.7)
Neutrophils Relative %: 74 %
Platelets: 263 10*3/uL (ref 150–400)
RBC: 4.83 MIL/uL (ref 4.22–5.81)
RDW: 12.1 % (ref 11.5–15.5)
WBC: 9.1 10*3/uL (ref 4.0–10.5)
nRBC: 0 % (ref 0.0–0.2)

## 2020-05-20 MED ORDER — VANCOMYCIN HCL 750 MG/150ML IV SOLN
750.0000 mg | Freq: Two times a day (BID) | INTRAVENOUS | Status: DC
  Administered 2020-05-21 – 2020-05-23 (×5): 750 mg via INTRAVENOUS
  Filled 2020-05-20 (×6): qty 150

## 2020-05-20 MED ORDER — VANCOMYCIN HCL IN DEXTROSE 1-5 GM/200ML-% IV SOLN
1000.0000 mg | Freq: Once | INTRAVENOUS | Status: AC
  Administered 2020-05-20: 1000 mg via INTRAVENOUS
  Filled 2020-05-20: qty 200

## 2020-05-20 MED ORDER — LOSARTAN POTASSIUM 25 MG PO TABS
25.0000 mg | ORAL_TABLET | Freq: Every day | ORAL | Status: DC
  Administered 2020-05-21 – 2020-05-23 (×3): 25 mg via ORAL
  Filled 2020-05-20 (×3): qty 1

## 2020-05-20 MED ORDER — ADULT MULTIVITAMIN W/MINERALS CH
1.0000 | ORAL_TABLET | Freq: Every day | ORAL | Status: DC
  Administered 2020-05-21 – 2020-05-23 (×3): 1 via ORAL
  Filled 2020-05-20 (×3): qty 1

## 2020-05-20 MED ORDER — MORPHINE SULFATE (PF) 4 MG/ML IV SOLN
2.0000 mg | INTRAVENOUS | Status: DC | PRN
  Administered 2020-05-22: 2 mg via INTRAVENOUS
  Filled 2020-05-20: qty 1

## 2020-05-20 MED ORDER — MORPHINE SULFATE (PF) 4 MG/ML IV SOLN
4.0000 mg | Freq: Once | INTRAVENOUS | Status: AC
  Administered 2020-05-20: 4 mg via INTRAVENOUS
  Filled 2020-05-20: qty 1

## 2020-05-20 MED ORDER — VITAMIN D 25 MCG (1000 UNIT) PO TABS
1000.0000 [IU] | ORAL_TABLET | Freq: Every day | ORAL | Status: DC
  Administered 2020-05-21 – 2020-05-23 (×3): 1000 [IU] via ORAL
  Filled 2020-05-20 (×3): qty 1

## 2020-05-20 MED ORDER — ROSUVASTATIN CALCIUM 5 MG PO TABS
5.0000 mg | ORAL_TABLET | Freq: Every day | ORAL | Status: DC
  Administered 2020-05-21 – 2020-05-23 (×3): 5 mg via ORAL
  Filled 2020-05-20 (×3): qty 1

## 2020-05-20 MED ORDER — METOPROLOL TARTRATE 12.5 MG HALF TABLET
12.5000 mg | ORAL_TABLET | Freq: Two times a day (BID) | ORAL | Status: DC
  Administered 2020-05-21 – 2020-05-23 (×6): 12.5 mg via ORAL
  Filled 2020-05-20 (×6): qty 1

## 2020-05-20 MED ORDER — OXYCODONE-ACETAMINOPHEN 5-325 MG PO TABS: 1.0000 | ORAL_TABLET | ORAL | Status: DC | PRN

## 2020-05-20 MED ORDER — ONDANSETRON HCL 4 MG/2ML IJ SOLN
4.0000 mg | Freq: Once | INTRAMUSCULAR | Status: AC
  Administered 2020-05-20: 4 mg via INTRAVENOUS
  Filled 2020-05-20: qty 2

## 2020-05-20 MED ORDER — LORATADINE 10 MG PO TABS
10.0000 mg | ORAL_TABLET | Freq: Every day | ORAL | Status: DC
  Filled 2020-05-20 (×3): qty 1

## 2020-05-20 MED ORDER — COENZYME Q10 30 MG PO CAPS: 30.0000 mg | ORAL_CAPSULE | Freq: Every day | ORAL | Status: DC

## 2020-05-20 MED ORDER — ALBUTEROL SULFATE HFA 108 (90 BASE) MCG/ACT IN AERS
2.0000 | INHALATION_SPRAY | RESPIRATORY_TRACT | Status: DC | PRN
  Filled 2020-05-20: qty 6.7

## 2020-05-20 MED ORDER — VITAMIN B-12 100 MCG PO TABS
100.0000 ug | ORAL_TABLET | Freq: Every day | ORAL | Status: DC
  Administered 2020-05-21 – 2020-05-23 (×3): 100 ug via ORAL
  Filled 2020-05-20 (×3): qty 1

## 2020-05-20 NOTE — ED Triage Notes (Signed)
Pt to ED for admission to hosp to Dr. Treasa School.  Pt being admitted for infection to right elbow.   Surg to right elbow on 04/04/20

## 2020-05-20 NOTE — ED Provider Notes (Signed)
[facility] EMERGENCY DEPARTMENT Provider Note   CSN: [id number] Arrival date & time: 05/20/20  1911     History Chief Complaint  Patient presents with  . Post Op Problem    Eric Rhodes is a 64 y.o. male who presents for evaluation of redness, pain, swelling noted to his right elbow x1 week.  He reports that he recently had surgery to repair a tendon in August.  He states he had had improvement in pain, range of motion, swelling.  About a week ago, he noticed what he thought was a small abscess area around the incision.  He states that it started getting more swollen and then over the last few days, the area became more painful, more red, more swollen.  He states he has limited range of motion secondary to pain.  His fever has been 100.4 at home.  He was instructed to come to the emergency department for further evaluation.  He has gotten Covid vaccinated.  The history is provided by the patient.       Past Medical History:  Diagnosis Date  . BPH (benign prostatic hyperplasia)   . Chronic abdominal pain   . Chronic insomnia   . GERD (gastroesophageal reflux disease)    diet controlled  . H/O ETOH abuse   . Hernia, inguinal, left   . History of colonoscopy 2004  . History of endoscopy 2004  . Irritable bowel disease   . Prostatitis     Patient Active Problem List   Diagnosis Date Noted  . Joint infection ([place]) 05/20/2020  . Chronic insomnia 05/01/2018  . Anxiety 05/01/2018  . Nonobstructive atherosclerosis of coronary artery 05/01/2018  . Intermittent palpitations 05/01/2018  . GERD (gastroesophageal reflux disease) 05/01/2018  . Chest pain 05/01/2018  . Hyperlipidemia 12/18/2017  . Coronary artery calcification 12/18/2017  . Precordial chest pain 06/18/2017  . Inguinal hernia, left 09/27/2011    Past Surgical History:  Procedure Laterality Date  . ANTERIOR CRUCIATE LIGAMENT REPAIR Left 1978  . BUNIONECTOMY    . COLON MEDOFF    .  COLONOSCOPY    . HEMORRHOIDAL BANDING  10/2014  . HERNIA REPAIR Left 11/2011  . INGUINAL HERNIA REPAIR  11/07/2011   Procedure: HERNIA REPAIR INGUINAL ADULT;  Surgeon: Shann Medal, MD;  Location: [facility];  Service: General;  Laterality: Left;  Left Inguinal Herniorrhaphy with Mesh  . UPPER GASTROINTESTINAL ENDOSCOPY    . WISDOM TOOTH EXTRACTION         Family History  Problem Relation Age of Onset  . Heart disease Mother        Energy manager  . Myelodysplastic syndrome Mother        chronic  . Hypertension Mother   . Clotting disorder Mother   . Hypercholesterolemia Mother   . COPD Father        respiratory failure  . Hypertension Father   . Diabetes Brother   . Psoriasis Brother   . Obesity Brother   . Heart attack Brother   . ADD / ADHD Son   . ADD / ADHD Son     Social History   Tobacco Use  . Smoking status: Never Smoker  . Smokeless tobacco: Never Used  Vaping Use  . Vaping Use: Never used  Substance Use Topics  . Alcohol use: No  . Drug use: No    Home Medications Prior to Admission medications   Medication Sig Start Date End Date Taking? Authorizing Provider  aspirin 81 MG EC tablet Take 81 mg by mouth daily. Swallow whole.    [provider]  cetirizine (ZYRTEC) 10 MG tablet Take 10 mg by mouth daily as needed for allergies.     [provider]  cholecalciferol (VITAMIN D) 1000 units tablet Take 1,000 Units by mouth daily.    [provider]  co-enzyme Q-10 30 MG capsule Take 30 mg by mouth daily.    [provider]  losartan (COZAAR) 25 MG tablet Take 1 tablet (25 mg total) by mouth daily. 10/26/19   Dorothy Spark, MD  metoprolol tartrate (LOPRESSOR) 25 MG tablet Take 0.5 tablets (12.5 mg total) by mouth 2 (two) times daily. 10/26/19   Dorothy Spark, MD  Multiple Vitamin (MULTIVITAMIN) tablet Take 1 tablet daily by mouth.    [provider]  nystatin cream (MYCOSTATIN) Apply 1  application topically as needed. 10/16/18   [provider]  Omega-3 Fatty Acids (FISH OIL PO) Take 1 capsule by mouth 2 (two) times daily.    [provider]  REPATHA SURECLICK 301 MG/ML SOAJ INJECT THE CONTENTS OF 1 SYRINGE SUBCUTANEOUSLY EVERY 14 DAYS 12/20/19   Dorothy Spark, MD  rosuvastatin (CRESTOR) 5 MG tablet Take 5 mg by mouth daily. 02/12/19   [provider]  tacrolimus (PROTOPIC) 0.1 % ointment Apply 1 application topically daily as needed. For excema 04/02/18   [provider]  VENTOLIN HFA 108 (90 Base) MCG/ACT inhaler Use as needed 10/02/18   [provider]  vitamin B-12 (CYANOCOBALAMIN) 100 MCG tablet Take 100 mcg by mouth daily.    [provider]    Allergies    Augmentin [amoxicillin-pot clavulanate], Betadine [povidone iodine], Clavulanic acid, and Neosporin [neomycin-bacitracin zn-polymyx]  Review of Systems   Review of Systems  Constitutional: Negative for fever.  Musculoskeletal:       Right elbow pain  Skin: Positive for color change.  Neurological: Negative for weakness and numbness.  All other systems reviewed and are negative.   Physical Exam Updated Vital Signs BP (!) 142/81 (BP Location: Left Arm)   Pulse 78   Temp 99 F (37.2 C) (Oral)   Resp 16   Ht 5' 3.5" (1.613 m)   Wt 71.7 kg   SpO2 99%   BMI 27.55 kg/m   Physical Exam Vitals and nursing note reviewed.  Constitutional:      Appearance: Normal appearance. He is well-developed.  HENT:     Head: Normocephalic and atraumatic.  Eyes:     General: Lids are normal.     Conjunctiva/sclera: Conjunctivae normal.     Pupils: Pupils are equal, round, and reactive to light.  Cardiovascular:     Rate and Rhythm: Normal rate and regular rhythm.     Pulses: Normal pulses.          Radial pulses are 2+ on the right side and 2+ on the left side.     Heart sounds: Normal heart sounds. No murmur heard.  No friction rub. No gallop.   Pulmonary:      Effort: Pulmonary effort is normal.     Breath sounds: Normal breath sounds.  Abdominal:     Palpations: Abdomen is soft. Abdomen is not rigid.     Tenderness: There is no abdominal tenderness. There is no guarding.  Musculoskeletal:        General: Normal range of motion.     Cervical back: Full passive range of motion without pain.  Comments: Tenderness palpation of the right elbow with overlying soft tissue swelling, warmth, erythema.  Noted range of motion secondary to pain.  He has diffuse dependent edema noted down to the wrist and hands but good range of motion of the right wrist, right hand.  No tenderness palpation in left upper extremity.  Skin:    General: Skin is warm and dry.     Capillary Refill: Capillary refill takes less than 2 seconds.     Comments: Good distal cap refill. RUE is not dusky in appearance or cool to touch.  Neurological:     Mental Status: He is alert and oriented to person, place, and time.  Psychiatric:        Speech: Speech normal.     ED Results / Procedures / Treatments   Labs (all labs ordered are listed, but only abnormal results are displayed) Labs Reviewed  CBC WITH DIFFERENTIAL/PLATELET - Abnormal; Notable for the following components:      Result Value   Abs Immature Granulocytes 0.11 (*)    All other components within normal limits  COMPREHENSIVE METABOLIC PANEL - Abnormal; Notable for the following components:   Glucose, Bld 131 (*)    Creatinine, Ser 1.34 (*)    GFR calc non Af Amer 55 (*)    All other components within normal limits  CULTURE, BLOOD (ROUTINE X 2)  CULTURE, BLOOD (ROUTINE X 2)  SARS CORONAVIRUS 2 BY RT PCR (HOSPITAL ORDER, [place] [facility])    EKG None  Radiology No results found.  Procedures Procedures (including critical care time)  Medications Ordered in ED Medications  vancomycin (VANCOCIN) IVPB 1000 mg/200 mL premix (1,000 mg Intravenous New Bag/Given 05/20/20 2132)   oxyCODONE-acetaminophen (PERCOCET/ROXICET) 5-325 MG per tablet 1 tablet (has no administration in time range)  morphine 4 MG/ML injection 2 mg (has no administration in time range)  ondansetron (ZOFRAN) injection 4 mg (4 mg Intravenous Given 05/20/20 2127)  morphine 4 MG/ML injection 4 mg (4 mg Intravenous Given 05/20/20 2131)    ED Course  I have reviewed the triage vital signs and the nursing notes.  Pertinent labs & imaging results that were available during my care of the patient were reviewed by me and considered in my medical decision making (see chart for details).    MDM Rules/Calculators/A&P                          64 year old male who presents for evaluation of right elbow pain, swelling.  History of recent tendon repair.  Ports worsening symptoms instructed to come to the emergency department for further evaluation.  On initial arrival, he is afebrile, nontoxic-appearing.  Vital signs are stable.  On exam, he has tenderness, warmth, erythema noted to right elbow with overlying soft tissue lying down his arm.  Good pulses.  Plan for labs, Covid test.  Ortho has evaluated patient will plan to admit the patient.  Discussed patient with Dr. Lynann Bologna (Ortho).  He recommends vancomycin. Patient will be admitted to ortho service under Tania Ade.   Updated patient on plan and he is agreeable.   Portions of this note were generated with Lobbyist. Dictation errors may occur despite best attempts at proofreading.  Final Clinical Impression(s) / ED Diagnoses Final diagnoses:  Joint infection [facility])    Rx / DC Orders ED Discharge Orders    None       Volanda Napoleon, PA-C 05/20/20  2144    Drenda Freeze, MD 05/21/20 [phone]

## 2020-05-20 NOTE — Progress Notes (Signed)
Pharmacy Antibiotic Note  Eric Rhodes is a 64 y.o. male admitted on 05/20/2020 with cellulitis.  Pharmacy has been consulted for Vancomycin and Cefepime dosing. WBC wnl, SCr 1.34.   Plan: -Vancomycin 750 mg IV Q 12 hours -Monitor CBC, renal fx, cultures and clinical progress -VT at [facility]  Height: 5' 3.5" (161.3 cm) Weight: 71.7 kg (158 lb) IBW/kg (Calculated) : 58.05  Temp (24hrs), Avg:99 F (37.2 C), Min:99 F (37.2 C), Max:99 F (37.2 C)  Recent Labs  Lab 05/20/20 1953  WBC 9.1  CREATININE 1.34*    Estimated Creatinine Clearance: 49.4 mL/min (A) (by C-G formula based on SCr of 1.34 mg/dL (H)).    Allergies  Allergen Reactions  . Augmentin [Amoxicillin-Pot Clavulanate] Other (See Comments)    Causes GI cramps  . Betadine [Povidone Iodine]     Skin sensitive  . Clavulanic Acid     GI Cramps  . Neosporin [Neomycin-Bacitracin Zn-Polymyx] Rash    Contact dermatitis    Antimicrobials this admission: Vanc 9/18 >>  Cefepime 9/18 >>   Dose adjustments this admission:  Microbiology results:   Thank you for allowing pharmacy to be a part of this patient's care.  Albertina Parr, PharmD., BCPS, BCCCP Clinical Pharmacist Clinical phone for 05/20/20 until 10pm: [phone] If after 10pm, please refer to [facility] for unit-specific pharmacist

## 2020-05-20 NOTE — H&P (Signed)
Chief Complaint: Right elbow pain  HPI: Eric Rhodes is a 64 y.o. male who presents with severe right elbow pain.  Patient was evaluated in the office yesterday.  Per the patient, his pain had increased since yesterday, despite an intramuscular injection of Rocephin, as well as Keflex.  He continues to have erythema and swelling throughout the right upper extremity, extending to his right hand.  I did talk to him on the phone earlier today, and did advise him to present to the emergency department for additional evaluation.  He does state that the pain is constant, increased with range of motion of the right elbow.  He is approximately 6-week status post a right triceps repair by Dr. Tamera Punt.   Past Medical History:  Diagnosis Date  . BPH (benign prostatic hyperplasia)   . Chronic abdominal pain   . Chronic insomnia   . GERD (gastroesophageal reflux disease)    diet controlled  . H/O ETOH abuse   . Hernia, inguinal, left   . History of colonoscopy 2004  . History of endoscopy 2004  . Irritable bowel disease   . Prostatitis    Past Surgical History:  Procedure Laterality Date  . ANTERIOR CRUCIATE LIGAMENT REPAIR Left 1978  . BUNIONECTOMY    . COLON MEDOFF    . COLONOSCOPY    . HEMORRHOIDAL BANDING  10/2014  . HERNIA REPAIR Left 11/2011  . INGUINAL HERNIA REPAIR  11/07/2011   Procedure: HERNIA REPAIR INGUINAL ADULT;  Surgeon: Shann Medal, MD;  Location: [place];  Service: General;  Laterality: Left;  Left Inguinal Herniorrhaphy with Mesh  . UPPER GASTROINTESTINAL ENDOSCOPY    . WISDOM TOOTH EXTRACTION     Social History   Socioeconomic History  . Marital status: Married    Spouse name: Not on file  . Number of children: Not on file  . Years of education: Not on file  . Highest education level: Not on file  Occupational History  . Occupation: podiatrist    Comment: retired  Tobacco Use  . Smoking status: Never Smoker  . Smokeless tobacco: Never  Used  Vaping Use  . Vaping Use: Never used  Substance and Sexual Activity  . Alcohol use: No  . Drug use: No  . Sexual activity: Not on file  Other Topics Concern  . Not on file  Social History Narrative   He is a retired Art therapist   Rides bicycle 3 x a week (rides with Dr. Eustace Quail); goes to gym opposite days   Social Determinants of Health   Financial Resource Strain:   . Difficulty of Paying Living Expenses: Not on file  Food Insecurity:   . Worried About Charity fundraiser in the Last Year: Not on file  . Ran Out of Food in the Last Year: Not on file  Transportation Needs:   . Lack of Transportation (Medical): Not on file  . Lack of Transportation (Non-Medical): Not on file  Physical Activity:   . Days of Exercise per Week: Not on file  . Minutes of Exercise per Session: Not on file  Stress:   . Feeling of Stress : Not on file  Social Connections:   . Frequency of Communication with Friends and Family: Not on file  . Frequency of Social Gatherings with Friends and Family: Not on file  . Attends Religious Services: Not on file  . Active Member of Clubs or Organizations: Not on file  . Attends Archivist  Meetings: Not on file  . Marital Status: Not on file   Family History  Problem Relation Age of Onset  . Heart disease Mother        Energy manager  . Myelodysplastic syndrome Mother        chronic  . Hypertension Mother   . Clotting disorder Mother   . Hypercholesterolemia Mother   . COPD Father        respiratory failure  . Hypertension Father   . Diabetes Brother   . Psoriasis Brother   . Obesity Brother   . Heart attack Brother   . ADD / ADHD Son   . ADD / ADHD Son    Allergies  Allergen Reactions  . Augmentin [Amoxicillin-Pot Clavulanate] Other (See Comments)    Causes GI cramps  . Betadine [Povidone Iodine]     Skin sensitive  . Clavulanic Acid     GI Cramps  . Neosporin [Neomycin-Bacitracin Zn-Polymyx] Rash    Contact  dermatitis   Prior to Admission medications   Medication Sig Start Date End Date Taking? Authorizing Provider  aspirin 81 MG EC tablet Take 81 mg by mouth daily. Swallow whole.    [provider]  cetirizine (ZYRTEC) 10 MG tablet Take 10 mg by mouth daily as needed for allergies.     [provider]  cholecalciferol (VITAMIN D) 1000 units tablet Take 1,000 Units by mouth daily.    [provider]  co-enzyme Q-10 30 MG capsule Take 30 mg by mouth daily.    [provider]  losartan (COZAAR) 25 MG tablet Take 1 tablet (25 mg total) by mouth daily. 10/26/19   Dorothy Spark, MD  metoprolol tartrate (LOPRESSOR) 25 MG tablet Take 0.5 tablets (12.5 mg total) by mouth 2 (two) times daily. 10/26/19   Dorothy Spark, MD  Multiple Vitamin (MULTIVITAMIN) tablet Take 1 tablet daily by mouth.    [provider]  nystatin cream (MYCOSTATIN) Apply 1 application topically as needed. 10/16/18   [provider]  Omega-3 Fatty Acids (FISH OIL PO) Take 1 capsule by mouth 2 (two) times daily.    [provider]  REPATHA SURECLICK 536 MG/ML SOAJ INJECT THE CONTENTS OF 1 SYRINGE SUBCUTANEOUSLY EVERY 14 DAYS 12/20/19   Dorothy Spark, MD  rosuvastatin (CRESTOR) 5 MG tablet Take 5 mg by mouth daily. 02/12/19   [provider]  tacrolimus (PROTOPIC) 0.1 % ointment Apply 1 application topically daily as needed. For excema 04/02/18   [provider]  VENTOLIN HFA 108 (90 Base) MCG/ACT inhaler Use as needed 10/02/18   [provider]  vitamin B-12 (CYANOCOBALAMIN) 100 MCG tablet Take 100 mcg by mouth daily.    [provider]     All other systems have been reviewed and were otherwise negative with the exception of those mentioned in the HPI and as above.  Physical Exam: Vitals:   05/20/20 1935  BP: (!) 142/81  Pulse: 78  Resp: 16  Temp: 99 F (37.2 C)  SpO2: 99%    Body mass index is 27.55 kg/m.  General:  Alert, no acute distress Cardiovascular: No pedal edema Respiratory: No cyanosis, no use of accessory musculature Skin: No lesions in the area of chief complaint Neurologic: Sensation intact distally Psychiatric: Patient is competent for consent with normal mood and affect Lymphatic: No axillary or cervical lymphadenopathy  MUSCULOSKELETAL: The patient's right elbow is noted to be swollen circumferentially and diffusely, primarily at the proximal aspect of the olecranon.  There is no distinct fluctuance noted.  There is diffuse swelling throughout the right hand, relative to the left.  Passive range of motion of the right elbow is extremely painful.  Per the patient, this pain is worse than it was yesterday.  Assessment/Plan:  I do feel that the patient neither has cellulitis, or potentially an abscess.  I did discuss this patient's situation with his surgeon, Dr. Tamera Punt.  The plan is to admit him for IV antibiotics, specifically, vancomycin.  The patient will be reevaluated by Dr. Tamera Punt tomorrow in the morning.  At that point, Dr. Tamera Punt will make a decision with regards to whether we proceed with additional IV antibiotics, versus possible irrigation and debridement of his right elbow.  The patient will be made n.p.o. after midnight tonight, in the event that Dr. Tamera Punt does decide to proceed with surgical intervention tomorrow.  These thoughts were discussed in detail with the patient and he is very much on board with this plan.   Norva Karvonen, MD 05/20/2020 7:46 PM

## 2020-05-21 ENCOUNTER — Other Ambulatory Visit: Payer: Self-pay

## 2020-05-21 LAB — SARS CORONAVIRUS 2 BY RT PCR (HOSPITAL ORDER, PERFORMED IN ~~LOC~~ HOSPITAL LAB): SARS Coronavirus 2: NEGATIVE

## 2020-05-21 MED ORDER — DOCUSATE SODIUM 100 MG PO CAPS
100.0000 mg | ORAL_CAPSULE | Freq: Every day | ORAL | Status: DC | PRN
  Administered 2020-05-21: 100 mg via ORAL
  Filled 2020-05-21: qty 1

## 2020-05-21 MED ORDER — KCL IN DEXTROSE-NACL 20-5-0.45 MEQ/L-%-% IV SOLN
INTRAVENOUS | Status: DC
  Filled 2020-05-21 (×5): qty 1000

## 2020-05-21 NOTE — Progress Notes (Signed)
   PATIENT ID: Eric Rhodes       Subjective: Admitted last night for postop infection not responding to oral abx. Notes improvement after first dose of IV abx last night.  Feels that redness, pain and warmth a bit improved from last night.  Afebrile.  Objective:  Vitals:   05/21/20 0001 05/21/20 0023  BP: 123/66 130/67  Pulse: 65 (!) 56  Resp: 18 16  Temp: 98.9 F (37.2 C) 98.5 F (36.9 C)  SpO2: 100% 99%     R elbow with 5 x 10 cm area of erythema, warmth, TTP and swelling around incision site posterior.   Can flex to about 90 deg.  NVID.  Labs:  Recent Labs    05/20/20 1953  HGB 14.5   Recent Labs    05/20/20 1953  WBC 9.1  RBC 4.83  HCT 44.3  PLT 263   Recent Labs    05/20/20 1953  NA 137  K 4.4  CL 98  CO2 27  BUN 23  CREATININE 1.34*  GLUCOSE 131*  CALCIUM 9.4    Assessment and Plan:Post op infection R elbow Appears to be improving with IV abx Will give 24 more hours to monitor improvement on IV If not satisfactory improvement, may need surgical I&D Will recheck in am Ok to eat today, NPO p MN   VTE proph: SCDs, ambulation.

## 2020-05-21 NOTE — Plan of Care (Signed)
Problem: Education: °Goal: Knowledge of General Education information will improve °Description: Including pain rating scale, medication(s)/side effects and non-pharmacologic comfort measures °Outcome: Completed/Met °  °

## 2020-05-22 ENCOUNTER — Inpatient Hospital Stay (HOSPITAL_COMMUNITY): Payer: PPO | Admitting: Certified Registered Nurse Anesthetist

## 2020-05-22 ENCOUNTER — Encounter (HOSPITAL_COMMUNITY): Payer: Self-pay | Admitting: Orthopedic Surgery

## 2020-05-22 ENCOUNTER — Encounter (HOSPITAL_COMMUNITY)
Admission: EM | Disposition: A | Discharge status: Home / Self Care | Payer: Self-pay | Source: Ambulatory Visit | Attending: Orthopedic Surgery

## 2020-05-22 HISTORY — PX: IRRIGATION AND DEBRIDEMENT ELBOW: SHX6886

## 2020-05-22 LAB — CBC WITH DIFFERENTIAL/PLATELET
Abs Immature Granulocytes: 0.12 10*3/uL — ABNORMAL HIGH (ref 0.00–0.07)
Basophils Absolute: 0.1 10*3/uL (ref 0.0–0.1)
Basophils Relative: 1 %
Eosinophils Absolute: 0.2 10*3/uL (ref 0.0–0.5)
Eosinophils Relative: 3 %
HCT: 42 % (ref 39.0–52.0)
Hemoglobin: 13.9 g/dL (ref 13.0–17.0)
Immature Granulocytes: 2 %
Lymphocytes Relative: 21 %
Lymphs Abs: 1.6 10*3/uL (ref 0.7–4.0)
MCH: 30.3 pg (ref 26.0–34.0)
MCHC: 33.1 g/dL (ref 30.0–36.0)
MCV: 91.7 fL (ref 80.0–100.0)
Monocytes Absolute: 0.7 10*3/uL (ref 0.1–1.0)
Monocytes Relative: 9 %
Neutro Abs: 5 10*3/uL (ref 1.7–7.7)
Neutrophils Relative %: 64 %
Platelets: 251 10*3/uL (ref 150–400)
RBC: 4.58 MIL/uL (ref 4.22–5.81)
RDW: 11.9 % (ref 11.5–15.5)
WBC: 7.6 10*3/uL (ref 4.0–10.5)
nRBC: 0 % (ref 0.0–0.2)

## 2020-05-22 LAB — SEDIMENTATION RATE: Sed Rate: 19 mm/hr — ABNORMAL HIGH (ref 0–16)

## 2020-05-22 LAB — BASIC METABOLIC PANEL
Anion gap: 12 (ref 5–15)
BUN: 18 mg/dL (ref 8–23)
CO2: 25 mmol/L (ref 22–32)
Calcium: 9.1 mg/dL (ref 8.9–10.3)
Chloride: 102 mmol/L (ref 98–111)
Creatinine, Ser: 1.15 mg/dL (ref 0.61–1.24)
GFR calc Af Amer: >60 mL/min (ref >60)
GFR calc non Af Amer: >60 mL/min (ref >60)
Glucose, Bld: 125 mg/dL — ABNORMAL HIGH (ref 70–99)
Potassium: 3.9 mmol/L (ref 3.5–5.1)
Sodium: 139 mmol/L (ref 135–145)

## 2020-05-22 LAB — C-REACTIVE PROTEIN: CRP: 2.6 mg/dL — ABNORMAL HIGH (ref <1.0)

## 2020-05-22 SURGERY — IRRIGATION AND DEBRIDEMENT ELBOW
Anesthesia: General | Laterality: Right

## 2020-05-22 MED ORDER — HYDROMORPHONE HCL 1 MG/ML IJ SOLN
0.2500 mg | INTRAMUSCULAR | Status: DC | PRN
  Administered 2020-05-22 (×3): 0.5 mg via INTRAVENOUS

## 2020-05-22 MED ORDER — DEXAMETHASONE SODIUM PHOSPHATE 10 MG/ML IJ SOLN
INTRAMUSCULAR | Status: AC
  Filled 2020-05-22: qty 1

## 2020-05-22 MED ORDER — HYDROMORPHONE HCL 1 MG/ML IJ SOLN
INTRAMUSCULAR | Status: AC
Start: 2020-05-22 — End: 2020-05-23
  Filled 2020-05-22: qty 1

## 2020-05-22 MED ORDER — LIDOCAINE 2% (20 MG/ML) 5 ML SYRINGE
INTRAMUSCULAR | Status: DC | PRN
  Administered 2020-05-22: 60 mg via INTRAVENOUS

## 2020-05-22 MED ORDER — MIDAZOLAM HCL 5 MG/5ML IJ SOLN
INTRAMUSCULAR | Status: DC | PRN
  Administered 2020-05-22: 2 mg via INTRAVENOUS

## 2020-05-22 MED ORDER — OXYCODONE HCL 5 MG PO TABS
5.0000 mg | ORAL_TABLET | Freq: Once | ORAL | Status: AC | PRN
  Administered 2020-05-22: 5 mg via ORAL

## 2020-05-22 MED ORDER — PROPOFOL 10 MG/ML IV BOLUS
INTRAVENOUS | Status: DC | PRN
  Administered 2020-05-22: 180 mg via INTRAVENOUS

## 2020-05-22 MED ORDER — ONDANSETRON HCL 4 MG/2ML IJ SOLN: 4.0000 mg | Freq: Four times a day (QID) | INTRAMUSCULAR | Status: DC | PRN

## 2020-05-22 MED ORDER — OXYCODONE-ACETAMINOPHEN 5-325 MG PO TABS: ORAL_TABLET | ORAL | 0 refills | Status: DC

## 2020-05-22 MED ORDER — LEVOFLOXACIN 750 MG PO TABS: 750.0000 mg | ORAL_TABLET | Freq: Every day | ORAL | 0 refills | Status: AC

## 2020-05-22 MED ORDER — CHLORHEXIDINE GLUCONATE 4 % EX LIQD
60.0000 mL | Freq: Once | CUTANEOUS | Status: DC
  Filled 2020-05-22 (×2): qty 60

## 2020-05-22 MED ORDER — CHLORHEXIDINE GLUCONATE 0.12 % MT SOLN
OROMUCOSAL | Status: AC
  Administered 2020-05-22: 15 mL via OROMUCOSAL
  Filled 2020-05-22: qty 15

## 2020-05-22 MED ORDER — SODIUM CHLORIDE 0.9 % IR SOLN
Status: DC | PRN
  Administered 2020-05-22: 3000 mL

## 2020-05-22 MED ORDER — METOCLOPRAMIDE HCL 5 MG PO TABS: 5.0000 mg | ORAL_TABLET | Freq: Three times a day (TID) | ORAL | Status: DC | PRN

## 2020-05-22 MED ORDER — MIDAZOLAM HCL 2 MG/2ML IJ SOLN
INTRAMUSCULAR | Status: AC
  Filled 2020-05-22: qty 2

## 2020-05-22 MED ORDER — 0.9 % SODIUM CHLORIDE (POUR BTL) OPTIME
TOPICAL | Status: DC | PRN
  Administered 2020-05-22: 1000 mL

## 2020-05-22 MED ORDER — DEXAMETHASONE SODIUM PHOSPHATE 4 MG/ML IJ SOLN
INTRAMUSCULAR | Status: DC | PRN
  Administered 2020-05-22: 4 mg via INTRAVENOUS

## 2020-05-22 MED ORDER — FENTANYL CITRATE (PF) 100 MCG/2ML IJ SOLN
INTRAMUSCULAR | Status: AC
  Filled 2020-05-22: qty 2

## 2020-05-22 MED ORDER — DOCUSATE SODIUM 100 MG PO CAPS
100.0000 mg | ORAL_CAPSULE | Freq: Two times a day (BID) | ORAL | Status: DC
  Administered 2020-05-22 – 2020-05-23 (×2): 100 mg via ORAL
  Filled 2020-05-22 (×2): qty 1

## 2020-05-22 MED ORDER — FENTANYL CITRATE (PF) 100 MCG/2ML IJ SOLN
25.0000 ug | INTRAMUSCULAR | Status: DC | PRN
  Administered 2020-05-22 (×3): 50 ug via INTRAVENOUS

## 2020-05-22 MED ORDER — HYDROMORPHONE HCL 1 MG/ML IJ SOLN
INTRAMUSCULAR | Status: AC
  Filled 2020-05-22: qty 1

## 2020-05-22 MED ORDER — POLYETHYLENE GLYCOL 3350 17 G PO PACK: 17.0000 g | PACK | Freq: Every day | ORAL | Status: DC | PRN

## 2020-05-22 MED ORDER — OXYCODONE HCL 5 MG PO TABS
ORAL_TABLET | ORAL | Status: AC
  Filled 2020-05-22: qty 1

## 2020-05-22 MED ORDER — FENTANYL CITRATE (PF) 250 MCG/5ML IJ SOLN
INTRAMUSCULAR | Status: AC
  Filled 2020-05-22: qty 5

## 2020-05-22 MED ORDER — METOCLOPRAMIDE HCL 5 MG/ML IJ SOLN: 5.0000 mg | Freq: Three times a day (TID) | INTRAMUSCULAR | Status: DC | PRN

## 2020-05-22 MED ORDER — ONDANSETRON HCL 4 MG PO TABS: 4.0000 mg | ORAL_TABLET | Freq: Four times a day (QID) | ORAL | Status: DC | PRN

## 2020-05-22 MED ORDER — LACTATED RINGERS IV SOLN: INTRAVENOUS | Status: DC

## 2020-05-22 MED ORDER — OXYCODONE HCL 5 MG/5ML PO SOLN: 5.0000 mg | Freq: Once | ORAL | Status: AC | PRN

## 2020-05-22 MED ORDER — ONDANSETRON HCL 4 MG/2ML IJ SOLN
INTRAMUSCULAR | Status: AC
  Filled 2020-05-22: qty 2

## 2020-05-22 MED ORDER — ZOLPIDEM TARTRATE 5 MG PO TABS
5.0000 mg | ORAL_TABLET | Freq: Every evening | ORAL | Status: DC | PRN
  Administered 2020-05-22: 5 mg via ORAL
  Filled 2020-05-22: qty 1

## 2020-05-22 MED ORDER — CHLORHEXIDINE GLUCONATE 0.12 % MT SOLN: 15.0000 mL | Freq: Once | OROMUCOSAL | Status: AC

## 2020-05-22 MED ORDER — ONDANSETRON HCL 4 MG/2ML IJ SOLN
INTRAMUSCULAR | Status: DC | PRN
  Administered 2020-05-22: 4 mg via INTRAVENOUS

## 2020-05-22 MED ORDER — FENTANYL CITRATE (PF) 100 MCG/2ML IJ SOLN
INTRAMUSCULAR | Status: DC | PRN
Start: 2020-05-22 — End: 2020-05-22
  Administered 2020-05-22: 50 ug via INTRAVENOUS
  Administered 2020-05-22: 25 ug via INTRAVENOUS
  Administered 2020-05-22: 50 ug via INTRAVENOUS
  Administered 2020-05-22: 25 ug via INTRAVENOUS

## 2020-05-22 SURGICAL SUPPLY — 56 items
APL PRP STRL LF DISP 70% ISPRP (MISCELLANEOUS) ×1
BLADE SURG 10 STRL SS (BLADE) ×2 IMPLANT
BNDG CMPR 9X4 STRL LF SNTH (GAUZE/BANDAGES/DRESSINGS) ×1
BNDG CMPR MED 15X6 ELC VLCR LF (GAUZE/BANDAGES/DRESSINGS) ×1
BNDG COHESIVE 4X5 TAN STRL (GAUZE/BANDAGES/DRESSINGS) ×2 IMPLANT
BNDG CONFORM 3 STRL LF (GAUZE/BANDAGES/DRESSINGS) IMPLANT
BNDG ELASTIC 3X5.8 VLCR STR LF (GAUZE/BANDAGES/DRESSINGS) IMPLANT
BNDG ELASTIC 4X5.8 VLCR STR LF (GAUZE/BANDAGES/DRESSINGS) ×1 IMPLANT
BNDG ELASTIC 6X15 VLCR STRL LF (GAUZE/BANDAGES/DRESSINGS) ×2 IMPLANT
BNDG ESMARK 4X9 LF (GAUZE/BANDAGES/DRESSINGS) ×2 IMPLANT
BNDG GAUZE ELAST 4 BULKY (GAUZE/BANDAGES/DRESSINGS) ×2 IMPLANT
CHLORAPREP W/TINT 26 (MISCELLANEOUS) ×2 IMPLANT
COVER SURGICAL LIGHT HANDLE (MISCELLANEOUS) ×2 IMPLANT
COVER WAND RF STERILE (DRAPES) ×1 IMPLANT
CUFF TOURN SGL QUICK 18X4 (TOURNIQUET CUFF) ×1 IMPLANT
CUFF TOURN SGL QUICK 34 (TOURNIQUET CUFF)
CUFF TOURN SGL QUICK 42 (TOURNIQUET CUFF) IMPLANT
CUFF TRNQT CYL 34X4.125X (TOURNIQUET CUFF) IMPLANT
DRAIN PENROSE 1/4X12 LTX STRL (WOUND CARE) ×1 IMPLANT
DRAPE SURG 17X23 STRL (DRAPES) ×2 IMPLANT
DRAPE U-SHAPE 47X51 STRL (DRAPES) ×2 IMPLANT
DRSG ADAPTIC 3X8 NADH LF (GAUZE/BANDAGES/DRESSINGS) ×2 IMPLANT
DRSG PAD ABDOMINAL 8X10 ST (GAUZE/BANDAGES/DRESSINGS) ×4 IMPLANT
DURAPREP 26ML APPLICATOR (WOUND CARE) ×1 IMPLANT
ELECT REM PT RETURN 9FT ADLT (ELECTROSURGICAL) ×2
ELECTRODE REM PT RTRN 9FT ADLT (ELECTROSURGICAL) ×1 IMPLANT
GAUZE SPONGE 4X4 12PLY STRL (GAUZE/BANDAGES/DRESSINGS) ×2 IMPLANT
GAUZE XEROFORM 5X9 LF (GAUZE/BANDAGES/DRESSINGS) IMPLANT
GLOVE BIO SURGEON STRL SZ7 (GLOVE) ×2 IMPLANT
GLOVE BIO SURGEON STRL SZ7.5 (GLOVE) ×2 IMPLANT
GLOVE BIOGEL PI IND STRL 8 (GLOVE) ×1 IMPLANT
GLOVE BIOGEL PI INDICATOR 8 (GLOVE) ×1
GOWN STRL REUS W/ TWL LRG LVL3 (GOWN DISPOSABLE) ×3 IMPLANT
GOWN STRL REUS W/TWL LRG LVL3 (GOWN DISPOSABLE) ×6
HANDPIECE INTERPULSE COAX TIP (DISPOSABLE)
KIT BASIN OR (CUSTOM PROCEDURE TRAY) ×2 IMPLANT
KIT TURNOVER KIT B (KITS) ×2 IMPLANT
MANIFOLD NEPTUNE II (INSTRUMENTS) ×2 IMPLANT
NS IRRIG 1000ML POUR BTL (IV SOLUTION) ×2 IMPLANT
PACK ORTHO EXTREMITY (CUSTOM PROCEDURE TRAY) ×2 IMPLANT
PAD ABD 8X10 STRL (GAUZE/BANDAGES/DRESSINGS) ×1 IMPLANT
PAD ARMBOARD 7.5X6 YLW CONV (MISCELLANEOUS) ×4 IMPLANT
SET CYSTO W/LG BORE CLAMP LF (SET/KITS/TRAYS/PACK) ×1 IMPLANT
SET HNDPC FAN SPRY TIP SCT (DISPOSABLE) IMPLANT
SLING ARM IMMOBILIZER XL (CAST SUPPLIES) ×1 IMPLANT
SPONGE LAP 18X18 RF (DISPOSABLE) ×4 IMPLANT
STAPLER VISISTAT 35W (STAPLE) ×1 IMPLANT
STOCKINETTE IMPERVIOUS 9X36 MD (GAUZE/BANDAGES/DRESSINGS) ×2 IMPLANT
SUT PROLENE 2 0 CT2 30 (SUTURE) ×2 IMPLANT
SUT PROLENE 3 0 PS 2 (SUTURE) IMPLANT
SWAB CULTURE ESWAB REG 1ML (MISCELLANEOUS) ×2 IMPLANT
TOWEL GREEN STERILE (TOWEL DISPOSABLE) ×2 IMPLANT
TOWEL GREEN STERILE FF (TOWEL DISPOSABLE) ×2 IMPLANT
TUBE CONNECTING 12X1/4 (SUCTIONS) ×2 IMPLANT
WATER STERILE IRR 1000ML POUR (IV SOLUTION) ×2 IMPLANT
YANKAUER SUCT BULB TIP NO VENT (SUCTIONS) ×2 IMPLANT

## 2020-05-22 NOTE — Progress Notes (Signed)
   PATIENT ID: Eric Rhodes       Subjective: no new complaints.  The posterior elbow still feels painful and swollen.  He thinks it may be a little bit better but not much.  Objective:  Vitals:   05/22/20 0031 05/22/20 0500  BP: 133/70 99/66  Pulse: 61 60  Resp:    Temp: 98.5 F (36.9 C) 98.7 F (37.1 C)  SpO2: 98% 98%     Examination of the right elbow today shows continued erythema warmth and swelling.  There may be some fluctuance beneath the incision at this point.  There is not been any progression of the erythema beyond the line drawn yesterday morning.  Labs:  Recent Labs    05/20/20 1953 05/22/20 0226  HGB 14.5 13.9   Recent Labs    05/20/20 1953 05/22/20 0226  WBC 9.1 7.6  RBC 4.83 4.58  HCT 44.3 42.0  PLT 263 251   Recent Labs    05/20/20 1953 05/22/20 0226  NA 137 139  K 4.4 3.9  CL 98 102  CO2 27 25  BUN 23 18  CREATININE 1.34* 1.15  GLUCOSE 131* 125*  CALCIUM 9.4 9.1    Assessment and Plan:right elbow postoperative infection At this point he continues to have fairly significant cellulitis and pain.  I think he probably has some pus beneath the incision that needs to be evacuated.  At this point will move towards formal I&D in the operating room and will place a drain.  He will then spent 1 more night in the hospital to get some additional IV antibiotics and then be discharged home tomorrow with oral antibiotics.   VTE proph: SCDs and ambulation

## 2020-05-22 NOTE — Anesthesia Preprocedure Evaluation (Addendum)
Anesthesia Evaluation  Patient identified by MRN, date of birth, ID band Patient awake    Reviewed: Allergy & Precautions, H&P , NPO status , Patient's Chart, lab work & pertinent test results  Airway Mallampati: II   Neck ROM: full    Dental   Pulmonary neg pulmonary ROS,    breath sounds clear to auscultation       Cardiovascular + CAD   Rhythm:regular Rate:Normal     Neuro/Psych PSYCHIATRIC DISORDERS Anxiety    GI/Hepatic GERD  ,(+)     substance abuse  alcohol use,   Endo/Other    Renal/GU      Musculoskeletal   Abdominal   Peds  Hematology   Anesthesia Other Findings   Reproductive/Obstetrics                             Anesthesia Physical Anesthesia Plan  ASA: II  Anesthesia Plan: General   Post-op Pain Management:    Induction: Intravenous  PONV Risk Score and Plan: 2 and Ondansetron, Dexamethasone, Midazolam and Treatment may vary due to age or medical condition  Airway Management Planned: LMA  Additional Equipment:   Intra-op Plan:   Post-operative Plan: Extubation in OR  Informed Consent: I have reviewed the patients History and Physical, chart, labs and discussed the procedure including the risks, benefits and alternatives for the proposed anesthesia with the patient or authorized representative who has indicated his/her understanding and acceptance.       Plan Discussed with: CRNA, Anesthesiologist and Surgeon  Anesthesia Plan Comments:         Anesthesia Quick Evaluation

## 2020-05-22 NOTE — Discharge Instructions (Signed)
Discharge Instructions after Open Elbow Surgery   A sling may be provided for you. If so, remain in your sling at all times. This includes sleeping in the sling.  Use ice on the elbow intermittently over the first 48 hours after surgery. Pain medicine has been prescribed for you.  Use your medicine liberally over the first 48 hours, and then you can begin to taper your use. You may take Extra Strength Tylenol or Tylenol only in place of the pain pills.  Leave the dressing in place until your follow-up appointment  You may shower after surgery. The dressing CANNOT get wet during your shower. Wrap the dressing with a dry towel and place your arm in a large trash bag.  Take one aspirin a day for 2 weeks after surgery, unless you have an aspirin sensitivity/ allergy or asthma.    Please call [phone] during normal business hours or [phone] after hours for any problems. Including the following:  - excessive redness of the incisions - drainage for more than 4 days - fever of more than 101.5 F  *Please note that pain medications will not be refilled after hours or on weekends.

## 2020-05-22 NOTE — Op Note (Signed)
Procedure(s): IRRIGATION AND DEBRIDEMENT RIGHT ELBOW Procedure Note  Eric Rhodes [place] male 64 y.o. 05/22/2020   Preoperative diagnosis: Right elbow postoperative infection  Postoperative diagnosis: Same  Procedure(s) and Anesthesia Type:    * IRRIGATION AND DEBRIDEMENT RIGHT ELBOW postoperative infection- General      Surgeon: Isabella Stalling   Assistants: Jeanmarie Hubert PA-C (Danielle was present and scrubbed throughout the procedure and was essential in positioning, retraction, exposure, and closure)  Anesthesia: General endotracheal anesthesia    Procedure Detail  IRRIGATION AND DEBRIDEMENT RIGHT ELBOW  Estimated Blood Loss:  Minimal         Drains: none  Blood Given: none         Specimens: none        Complications:  * No complications entered in OR log *         Disposition: PACU - hemodynamically stable.         Condition: stable    Procedure:   INDICATIONS FOR SURGERY: The patient is about 6 weeks out from a triceps tendon repair which was initially doing very well and he was progressing well with therapy.  He began having increased pain swelling and redness.  He failed oral and IV antibiotics and was taken to the operating room to eradicate the infection.  OPERATIVE FINDINGS: There was gross purulence over the area of the repair.  This was sent for cultures.  The tendon repair was intact.  After copious irrigation and debridement the surgical site looked clean and was closed over a drain.  DESCRIPTION OF PROCEDURE: The patient was identified in preoperative  holding area where I personally marked the operative site after  verifying site, side, and procedure with the patient. The patient was taken back  to the operating room where general anesthesia was induced without  Complication. A nonsterile tourniquet was applied to the upper arm on the operative extremity and the patient was kept in the supine position.    The operative extremity was  prepped and draped in the standard sterile fashion and the tourniquet was elevated to 250 mm of mercury. An approximately 5 cm incision was made through the previous surgical site.  Purulence was immediately noted.  This was sent for culture.  The main pocket of purulence was posteriorly over the site of the tendon repair.  The tendon repair was noted to be intact.  Digital palpation was used to probe the depths of the abscess and it was noted to be relatively small area just over the section of the repair.  Small rondure was used to debride any phlegmonous material and then copious irrigation with 3 L normal saline was used to thoroughly irrigate the site.  At this point the surgical site appeared clean and again the repair appeared intact.  At this point 1/4 inch Penrose drain was placed percutaneously out laterally.  The wound was then closed using 2-0 Prolene in a horizontal mattress configuration.  A bulky sterile dressing was applied as well as a posterior splint at 90 degrees.  The tourniquet was let down.  The patient was allowed to awaken from anesthesia transferred to the stretcher and taken to the recovery room in stable condition.  Postoperative plan: He will be observed overnight for a few additional doses of IV antibiotics and then will be discharged home tomorrow on oral antibiotics.  He will follow up in the clinic in about 2 days to remove the splint and remove his drain.

## 2020-05-22 NOTE — Anesthesia Procedure Notes (Signed)
Procedure Name: LMA Insertion Date/Time: 05/22/2020 3:57 PM Performed by: Amadeo Garnet, CRNA Pre-anesthesia Checklist: Patient identified, Emergency Drugs available, Suction available and Patient being monitored Patient Re-evaluated:Patient Re-evaluated prior to induction Oxygen Delivery Method: Circle system utilized Preoxygenation: Pre-oxygenation with 100% oxygen Induction Type: IV induction Ventilation: Mask ventilation without difficulty LMA: LMA inserted LMA Size: 5.0 Placement Confirmation: positive ETCO2 and breath sounds checked- equal and bilateral Tube secured with: Tape Dental Injury: Teeth and Oropharynx as per pre-operative assessment

## 2020-05-22 NOTE — Transfer of Care (Signed)
Immediate Anesthesia Transfer of Care Note  Patient: Eric Rhodes  Procedure(s) Performed: IRRIGATION AND DEBRIDEMENT RIGHT ELBOW (Right )  Patient Location: PACU  Anesthesia Type:General  Level of Consciousness: awake  Airway & Oxygen Therapy: Patient Spontanous Breathing  Post-op Assessment: Report given to RN and Post -op Vital signs reviewed and stable  Post vital signs: Reviewed and stable  Last Vitals:  Vitals Value Taken Time  BP 164/65 05/22/20 1650  Temp 36.8 C 05/22/20 1650  Pulse 60 05/22/20 1654  Resp 11 05/22/20 1654  SpO2 98 % 05/22/20 1654  Vitals shown include unvalidated device data.  Last Pain:  Vitals:   05/22/20 1650  TempSrc:   PainSc: (P) 9       Patients Stated Pain Goal: 2 (10/68/16 6196)  Complications: No complications documented.

## 2020-05-22 NOTE — Anesthesia Postprocedure Evaluation (Signed)
Anesthesia Post Note  Patient: Eric Rhodes  Procedure(s) Performed: IRRIGATION AND DEBRIDEMENT RIGHT ELBOW (Right )     Patient location during evaluation: PACU Anesthesia Type: General Level of consciousness: awake and alert Pain management: pain level controlled Vital Signs Assessment: post-procedure vital signs reviewed and stable Respiratory status: spontaneous breathing, nonlabored ventilation, respiratory function stable and patient connected to nasal cannula oxygen Cardiovascular status: blood pressure returned to baseline and stable Postop Assessment: no apparent nausea or vomiting Anesthetic complications: no   No complications documented.  Last Vitals:  Vitals:   05/22/20 1735 05/22/20 1750  BP: (!) 171/75 (!) 181/69  Pulse: 64 62  Resp: 14 14  Temp:    SpO2: 97% 92%    Last Pain:  Vitals:   05/22/20 1750  TempSrc:   PainSc: [place]

## 2020-05-23 ENCOUNTER — Encounter (HOSPITAL_COMMUNITY): Payer: Self-pay | Admitting: Orthopedic Surgery

## 2020-05-23 NOTE — Progress Notes (Signed)
RN gave pt discharge instructions and he stated understanding. IVs have been removed, 2 new medications have been escribed to home pharmacy. Pt has called wife for pickup.

## 2020-05-23 NOTE — Progress Notes (Signed)
   PATIENT ID: Eric Rhodes   1 Day Post-Op Procedure(s) (LRB): IRRIGATION AND DEBRIDEMENT RIGHT ELBOW (Right)  Subjective: doing well, denies fever, chills. R elbow pain improved but needed iv morphine overnight.   Objective:  Vitals:   05/23/20 0120 05/23/20 0620  BP: 111/63 (!) 105/59  Pulse: (!) 51 (!) 56  Resp: 17 16  Temp: 97.9 F (36.6 C) 97.7 F (36.5 C)  SpO2: 93%      R elbow splint redressed Min distal swelling in hand  Labs:  Recent Labs    05/20/20 1953 05/22/20 0226  HGB 14.5 13.9   Recent Labs    05/20/20 1953 05/22/20 0226  WBC 9.1 7.6  RBC 4.83 4.58  HCT 44.3 42.0  PLT 263 251   Recent Labs    05/20/20 1953 05/22/20 0226  NA 137 139  K 4.4 3.9  CL 98 102  CO2 27 25  BUN 23 18  CREATININE 1.34* 1.15  GLUCOSE 131* 125*  CALCIUM 9.4 9.1    Assessment and Plan: Doing well 1 day s/p I and D elbow infxn s/p right triceps repair D/c on oral levaquin, percocet [facility] tomorrow for drain removal/splint removal and wound check   VTE proph: asa,scds

## 2020-05-24 DIAGNOSIS — Z9889 Other specified postprocedural states: Secondary | ICD-10-CM | POA: Diagnosis not present

## 2020-05-25 LAB — CULTURE, BLOOD (ROUTINE X 2)
Culture: NO GROWTH
Culture: NO GROWTH
Special Requests: ADEQUATE
Special Requests: ADEQUATE

## 2020-05-27 LAB — AEROBIC/ANAEROBIC CULTURE W GRAM STAIN (SURGICAL/DEEP WOUND)

## 2020-06-01 ENCOUNTER — Other Ambulatory Visit: Payer: Self-pay | Admitting: Cardiology

## 2020-06-01 DIAGNOSIS — E785 Hyperlipidemia, unspecified: Secondary | ICD-10-CM

## 2020-06-01 DIAGNOSIS — R002 Palpitations: Secondary | ICD-10-CM

## 2020-06-05 DIAGNOSIS — Z9889 Other specified postprocedural states: Secondary | ICD-10-CM | POA: Diagnosis not present

## 2020-06-12 DIAGNOSIS — Z9889 Other specified postprocedural states: Secondary | ICD-10-CM | POA: Diagnosis not present

## 2020-06-12 DIAGNOSIS — M25521 Pain in right elbow: Secondary | ICD-10-CM | POA: Diagnosis not present

## 2020-06-14 DIAGNOSIS — Z9889 Other specified postprocedural states: Secondary | ICD-10-CM | POA: Diagnosis not present

## 2020-06-23 DIAGNOSIS — Z9889 Other specified postprocedural states: Secondary | ICD-10-CM | POA: Diagnosis not present

## 2020-06-27 NOTE — Discharge Summary (Signed)
Patient ID: Eric Rhodes MRN: [id number] DOB/AGE: 05/29/1956 64 y.o.  Admit date: 05/20/2020 Discharge date: 05/23/2020  Admission Diagnoses:  Active Problems:   Joint infection [facility])   Discharge Diagnoses:  Same  Past Medical History:  Diagnosis Date  . BPH (benign prostatic hyperplasia)   . Chronic abdominal pain   . Chronic insomnia   . GERD (gastroesophageal reflux disease)    diet controlled  . H/O ETOH abuse   . Hernia, inguinal, left   . History of colonoscopy 2004  . History of endoscopy 2004  . Irritable bowel disease   . Prostatitis     Surgeries: Procedure(s): IRRIGATION AND DEBRIDEMENT RIGHT ELBOW on 05/22/2020   Consultants:   Discharged Condition: Improved  Hospital Course: Eric Rhodes is an 64 y.o. male who was admitted 05/20/2020 for operative treatment of right elbow infection s/p right triceps repair. Patient has severe unremitting pain that affects sleep, daily activities, and work/hobbies. After pre-op clearance the patient was taken to the operating room on 05/22/2020 and underwent  Procedure(s): IRRIGATION AND DEBRIDEMENT RIGHT ELBOW.    Patient was given perioperative antibiotics:  Anti-infectives (From admission, onward)   Start     Dose/Rate Route Frequency Ordered Stop   05/22/20 0000  levofloxacin (LEVAQUIN) 750 MG tablet        750 mg Oral Daily 05/22/20 1648 06/05/20 2359   05/21/20 1000  vancomycin (VANCOREADY) IVPB 750 mg/150 mL  Status:  Discontinued        750 mg 150 mL/hr over 60 Minutes Intravenous Every 12 hours 05/20/20 2147 05/23/20 1819   05/20/20 2115  vancomycin (VANCOCIN) IVPB 1000 mg/200 mL premix        1,000 mg 200 mL/hr over 60 Minutes Intravenous  Once 05/20/20 2111 05/20/20 2250       Patient was given sequential compression devices, early ambulation, and chemoprophylaxis to prevent DVT.  Patient benefited maximally from hospital stay and there were no complications.    Recent vital signs: No data  found.   Recent laboratory studies: No results for input(s): WBC, HGB, HCT, PLT, NA, K, CL, CO2, BUN, CREATININE, GLUCOSE, INR, CALCIUM in the last 72 hours.  Invalid input(s): PT, 2   Discharge Medications:   Allergies as of 05/23/2020      Reactions   Augmentin [amoxicillin-pot Clavulanate] Other (See Comments)   Causes GI cramps   Bacitracin Other (See Comments)   Positive patch test   Balsam Other (See Comments)   Positive patch test   Benzyl Cinnamate Other (See Comments)   Positive patch test   Betadine [povidone Iodine] Other (See Comments)   Skin sensitive   Clavulanic Acid Other (See Comments)   GI Cramps   Gold Sodium Thiosulfate Other (See Comments)   Positive patch test   Lanolin Other (See Comments)   Positive patch test   Limonene Other (See Comments)   Positive patch test   Linalool Other (See Comments)   Positive patch test   Methylisothiazolinone Other (See Comments)   Positive patch test   Other Other (See Comments)   Cinnamic Alcohol-Positive patch test Cinnamic Aldehyde-Positive patch test Compositae Mix-Positive patch test Dimethylaminopropylamine-Positive patch test Disperse Dye Mix-Positive patch test Quaternium-15-Positive patch test Fragrance mix I-Positive patch test Fragrance mix II-Positive patch test   Tea Tree Oil Other (See Comments)   Positive patch test   Neosporin [neomycin-bacitracin Zn-polymyx] Rash   Contact dermatitis      Medication List    STOP taking these medications  cephALEXin 500 MG capsule Commonly known as: KEFLEX     TAKE these medications   aspirin 81 MG EC tablet Take 81 mg by mouth at bedtime. Swallow whole.   celecoxib 200 MG capsule Commonly known as: CELEBREX Take 200 mg by mouth daily as needed (pain).   cetirizine 10 MG tablet Commonly known as: ZYRTEC Take 10 mg by mouth daily as needed (seasonal allergies).   cholecalciferol 1000 units tablet Commonly known as: VITAMIN D Take 1,000 Units by  mouth daily.   co-enzyme Q-10 30 MG capsule Take 30 mg by mouth daily.   FISH OIL PO Take 1 capsule by mouth 2 (two) times daily.   multivitamin with minerals Tabs tablet Take 1 tablet by mouth daily.   oxyCODONE-acetaminophen 5-325 MG tablet Commonly known as: Percocet Take 1-2 tablets every 4 hours as needed for post operative pain. MAX 6/day   Repatha SureClick 830 MG/ML Soaj Generic drug: Evolocumab INJECT THE CONTENTS OF 1 SYRINGE SUBCUTANEOUSLY EVERY 14 DAYS What changed: See the new instructions.   rosuvastatin 5 MG tablet Commonly known as: CRESTOR Take 5 mg by mouth at bedtime.   triamcinolone ointment 0.1 % Commonly known as: KENALOG Apply 1 application topically 2 (two) times daily as needed (rash).   Ventolin HFA 108 (90 Base) MCG/ACT inhaler Generic drug: albuterol Inhale 2 puffs into the lungs every 6 (six) hours as needed for wheezing or shortness of breath.   vitamin B-12 100 MCG tablet Commonly known as: CYANOCOBALAMIN Take 100 mcg by mouth daily.     ASK your doctor about these medications   levofloxacin 750 MG tablet Commonly known as: Levaquin Take 1 tablet (750 mg total) by mouth daily for 14 days. Ask about: Should I take this medication?       Diagnostic Studies: No results found.  Disposition: Discharge disposition: 01-Home or Self Care       Discharge Instructions    Call MD / Call 911   Complete by: As directed    If you experience chest pain or shortness of breath, CALL 911 and be transported to the hospital emergency room.  If you develope a fever above 101 F, pus (white drainage) or increased drainage or redness at the wound, or calf pain, call your surgeon's office.   Constipation Prevention   Complete by: As directed    Drink plenty of fluids.  Prune juice may be helpful.  You may use a stool softener, such as Colace (over the counter) 100 mg twice a day.  Use MiraLax (over the counter) for constipation as needed.   Diet -  low sodium heart healthy   Complete by: As directed    Increase activity slowly as tolerated   Complete by: As directed        Follow-up Information    Tania Ade, MD. Schedule an appointment as soon as possible for a visit on 05/24/2020.   Specialty: Orthopedic Surgery Contact information: [place] [address] [phone]                Signed: Grier Mitts 06/27/2020, 2:42 PM

## 2020-07-05 DIAGNOSIS — M25521 Pain in right elbow: Secondary | ICD-10-CM | POA: Diagnosis not present

## 2020-07-11 DIAGNOSIS — S46301D Unspecified injury of muscle, fascia and tendon of triceps, right arm, subsequent encounter: Secondary | ICD-10-CM | POA: Diagnosis not present

## 2020-07-12 DIAGNOSIS — S46301D Unspecified injury of muscle, fascia and tendon of triceps, right arm, subsequent encounter: Secondary | ICD-10-CM | POA: Diagnosis not present

## 2020-07-17 DIAGNOSIS — S46301D Unspecified injury of muscle, fascia and tendon of triceps, right arm, subsequent encounter: Secondary | ICD-10-CM | POA: Diagnosis not present

## 2020-07-20 DIAGNOSIS — S46301D Unspecified injury of muscle, fascia and tendon of triceps, right arm, subsequent encounter: Secondary | ICD-10-CM | POA: Diagnosis not present

## 2020-07-24 DIAGNOSIS — S46301D Unspecified injury of muscle, fascia and tendon of triceps, right arm, subsequent encounter: Secondary | ICD-10-CM | POA: Diagnosis not present

## 2020-07-26 DIAGNOSIS — S46301D Unspecified injury of muscle, fascia and tendon of triceps, right arm, subsequent encounter: Secondary | ICD-10-CM | POA: Diagnosis not present

## 2020-07-26 DIAGNOSIS — Z9889 Other specified postprocedural states: Secondary | ICD-10-CM | POA: Diagnosis not present

## 2020-08-01 DIAGNOSIS — S46301D Unspecified injury of muscle, fascia and tendon of triceps, right arm, subsequent encounter: Secondary | ICD-10-CM | POA: Diagnosis not present

## 2020-08-30 DIAGNOSIS — L0291 Cutaneous abscess, unspecified: Secondary | ICD-10-CM | POA: Diagnosis not present

## 2020-09-08 DIAGNOSIS — L0291 Cutaneous abscess, unspecified: Secondary | ICD-10-CM | POA: Diagnosis not present

## 2020-09-11 DIAGNOSIS — L0291 Cutaneous abscess, unspecified: Secondary | ICD-10-CM | POA: Diagnosis not present

## 2020-09-11 DIAGNOSIS — M25521 Pain in right elbow: Secondary | ICD-10-CM | POA: Diagnosis not present

## 2020-09-14 ENCOUNTER — Other Ambulatory Visit: Payer: Self-pay | Admitting: Orthopedic Surgery

## 2020-09-14 DIAGNOSIS — M25521 Pain in right elbow: Secondary | ICD-10-CM

## 2020-09-15 ENCOUNTER — Other Ambulatory Visit: Payer: Self-pay

## 2020-09-15 ENCOUNTER — Other Ambulatory Visit (HOSPITAL_COMMUNITY)
Admission: RE | Admit: 2020-09-15 | Discharge: 2020-09-15 | Disposition: A | Discharge status: Home / Self Care | Payer: PPO | Source: Ambulatory Visit | Attending: Orthopedic Surgery | Admitting: Orthopedic Surgery

## 2020-09-15 ENCOUNTER — Other Ambulatory Visit: Payer: Self-pay | Admitting: Orthopedic Surgery

## 2020-09-15 ENCOUNTER — Encounter (HOSPITAL_BASED_OUTPATIENT_CLINIC_OR_DEPARTMENT_OTHER): Payer: Self-pay | Admitting: Orthopedic Surgery

## 2020-09-15 ENCOUNTER — Ambulatory Visit
Admission: RE | Admit: 2020-09-15 | Discharge: 2020-09-15 | Disposition: A | Discharge status: Home / Self Care | Payer: PPO | Source: Ambulatory Visit | Attending: Orthopedic Surgery | Admitting: Orthopedic Surgery

## 2020-09-15 DIAGNOSIS — M19021 Primary osteoarthritis, right elbow: Secondary | ICD-10-CM | POA: Diagnosis not present

## 2020-09-15 DIAGNOSIS — S46211A Strain of muscle, fascia and tendon of other parts of biceps, right arm, initial encounter: Secondary | ICD-10-CM | POA: Diagnosis not present

## 2020-09-15 DIAGNOSIS — Z20822 Contact with and (suspected) exposure to covid-19: Secondary | ICD-10-CM | POA: Diagnosis not present

## 2020-09-15 DIAGNOSIS — Z01812 Encounter for preprocedural laboratory examination: Secondary | ICD-10-CM | POA: Insufficient documentation

## 2020-09-15 DIAGNOSIS — M25521 Pain in right elbow: Secondary | ICD-10-CM

## 2020-09-15 IMAGING — MR MR ELBOW*R* W/O CM
4 of 5 series · 18 of 40 positions shown · non-contrast
Comparison: MRI right elbow [DATE].

CLINICAL DATA: Recurrent infection in the right elbow which
initially began approximately 6 weeks after or triceps tendon. The
patient underwent irrigation and debridement of the elbow
[DATE].

EXAM:
MRI OF THE RIGHT ELBOW WITHOUT CONTRAST
TECHNIQUE: Multiplanar, multisequence MR imaging of the elbow was performed. No
intravenous contrast was administered.

[Series 4: T2 fat-sat · axial · 3.0mm · 0.23mm/px · z∈[-60,+60]mm · 8 of 32 slices shown (1 of 3)]
[im 1/32]
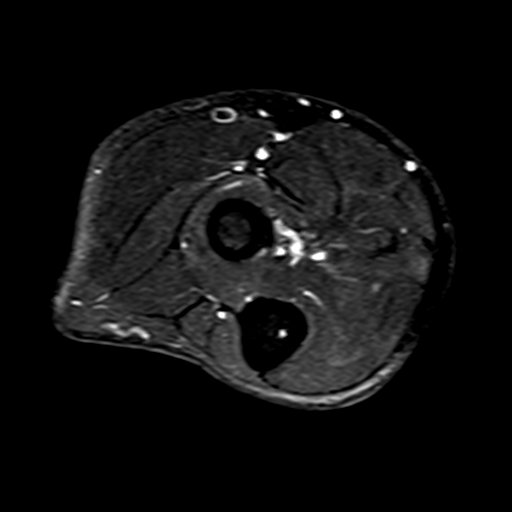
[im 5/32]
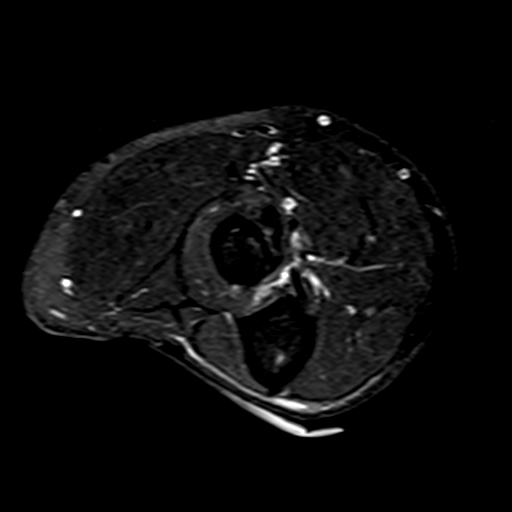
[im 9/32]
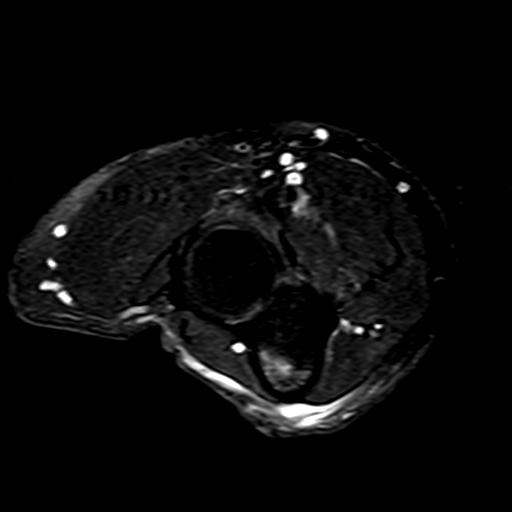
[im 14/32]
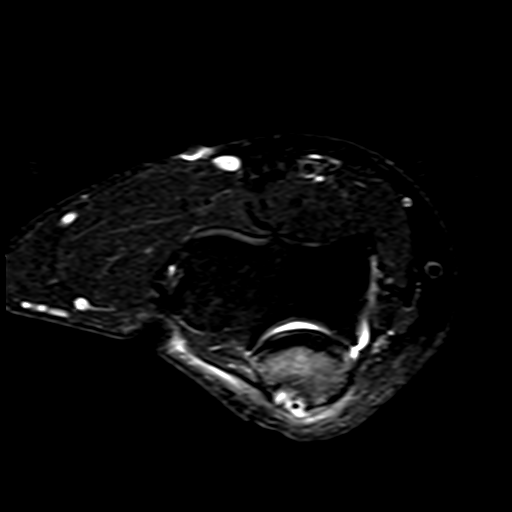
[im 18/32]
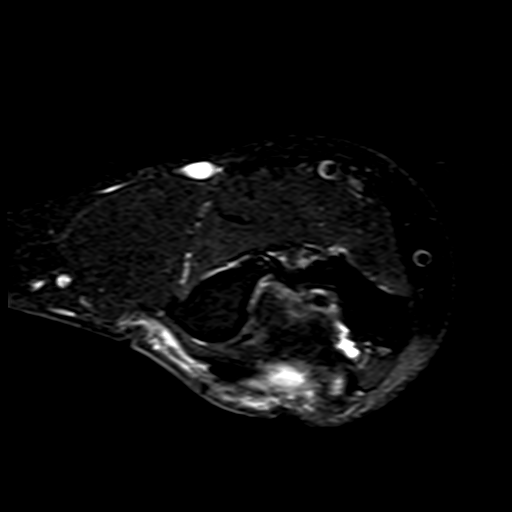
[im 23/32]
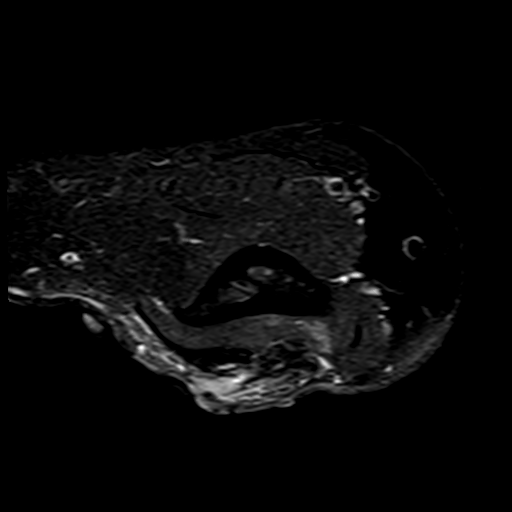
[im 27/32]
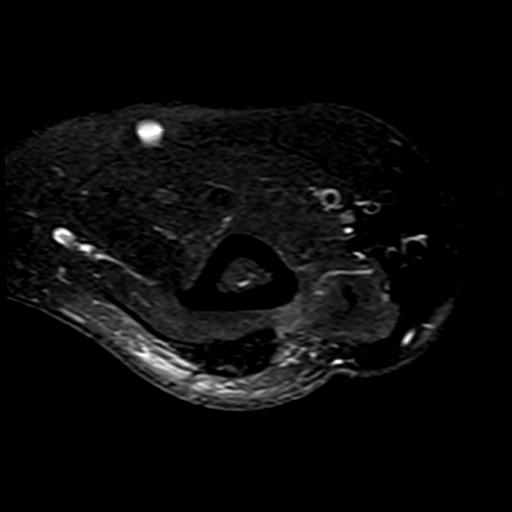
[im 32/32]
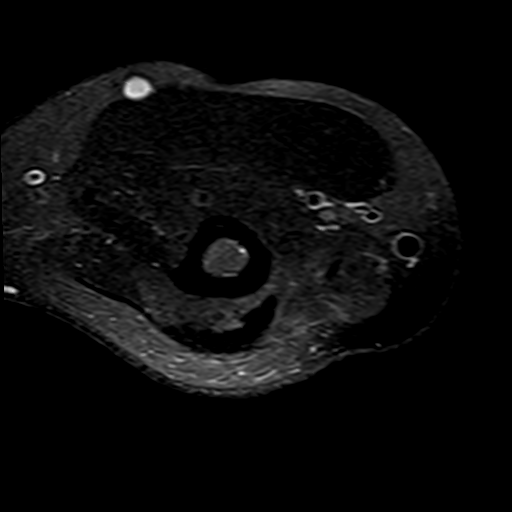

[Series 5: T1 · axial · 3.0mm · 0.23mm/px · z∈[-48,+44]mm · 3 of 32 slices shown]
[im 4/32]
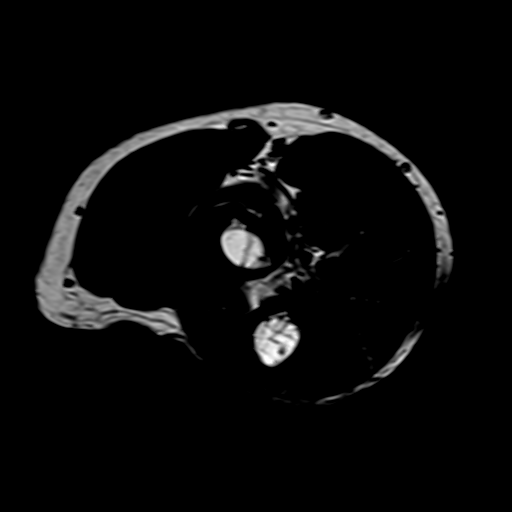
[im 16/32]
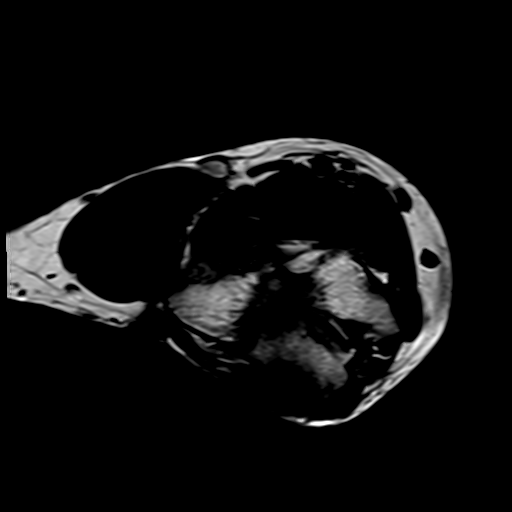
[im 28/32]
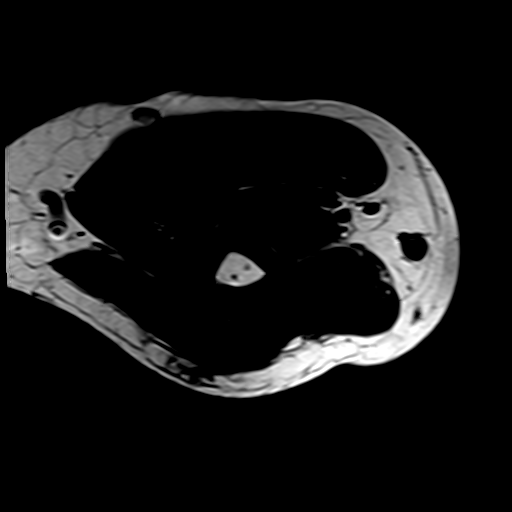

[Series 6: T2 fat-sat · coronal · 3.0mm · 0.27mm/px · 4 of 25 slices shown (2 of 3)]
[im 1/25]
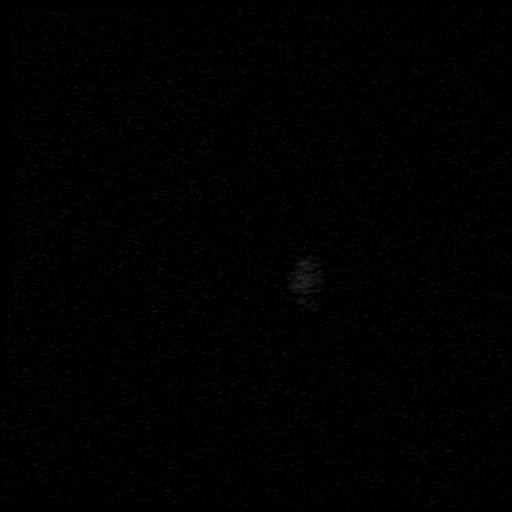
[im 5/25]
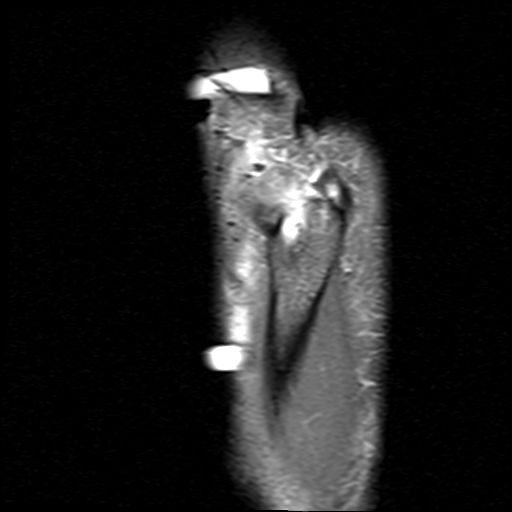
[im 13/25]
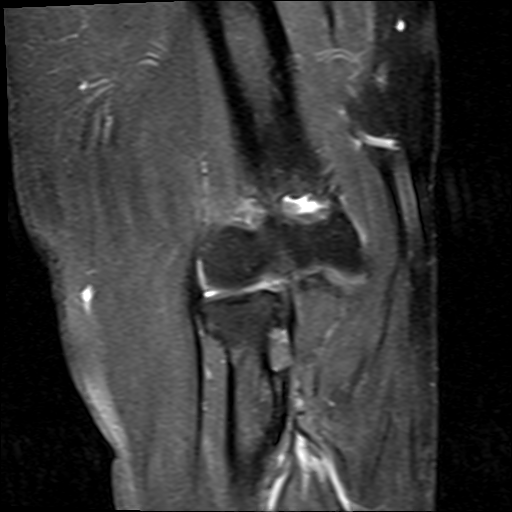
[im 21/25]
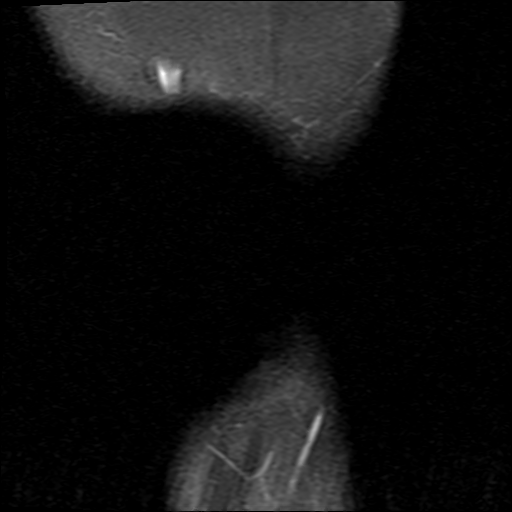

[Series 8: T2 fat-sat · sagittal · 3.0mm · 0.29mm/px · 3 of 33 slices shown (3 of 3)]
[im 5/33]
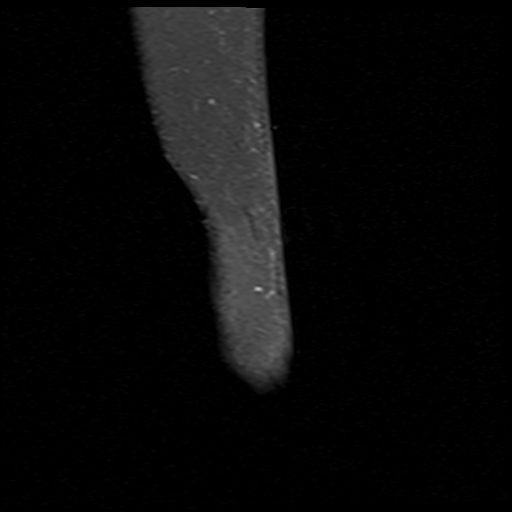
[im 17/33]
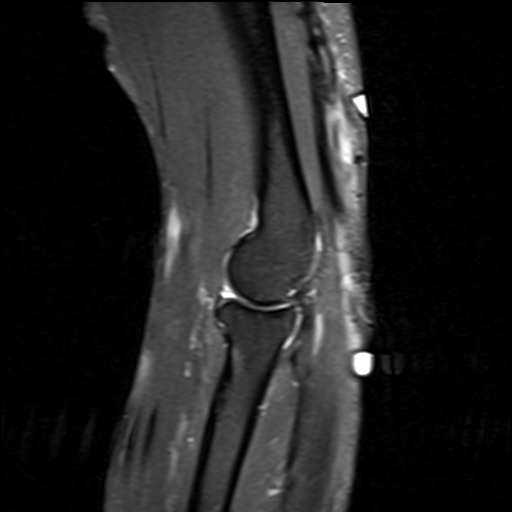
[im 29/33]
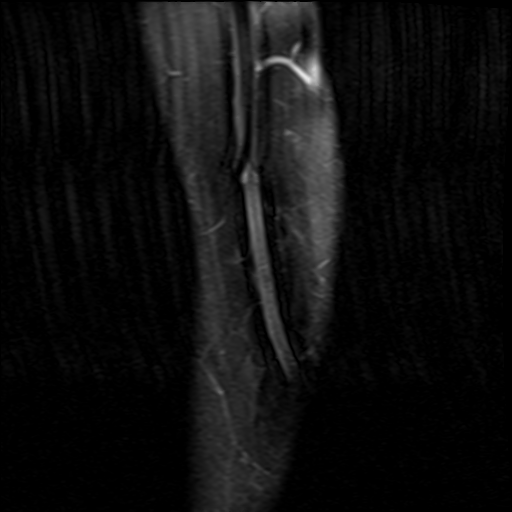

[18 of 40 positions shown; findings below may reference images not displayed]

FINDINGS: TENDONS

Common forearm flexor origin: Appears normal.

Common forearm extensor origin: Intrasubstance increased T2 signal
consistent with lateral epicondylitis is unchanged.

Biceps: Intact.

Triceps: A tear of the central fibers of the tendon measures
approximately 1 cm from front to back. This is at the tendon repair
site with a defect in the underlying olecranon identified.
Retraction is approximately 1.5 cm.

LIGAMENTS

Medial stabilizers: Intact.

Lateral stabilizers:  Intact.

Cartilage: Osteoarthritis about the elbow is unchanged in
appearance.

Joint: No effusion.

Cubital tunnel: Negative.

Bones: There is marrow edema in the olecranon consistent with
osteomyelitis which is new since the prior examination. There is a
focal defect in the olecranon measuring 0.6 cm AP x 0.6 cm
transverse which has fluid and artifact within it consistent with
the site of prior surgery. A fluid collection measuring 6 cm
craniocaudal by approximately 1.6 cm transverse by 0.7 cm AP is seen
posterior to lack or non highly suspicious for abscess.
IMPRESSION: New marrow edema in the olecranon is consistent with osteomyelitis.
A 0.6 cm in diameter focus of T2 hyperintensity in a defect in the
posterior olecranon is worrisome for an intraosseous abscess. This
is at the site of prior tendon attachment.

Fluid collection posterior to the olecranon worrisome for abscess.

1 cm transverse gap in the triceps tendon at its insertion is
compatible with recurrent tear.

No change in lateral epicondylitis.

Osteoarthritis about the elbow as seen on the prior exam.

## 2020-09-16 LAB — SARS CORONAVIRUS 2 (TAT 6-24 HRS): SARS Coronavirus 2: NEGATIVE

## 2020-09-18 ENCOUNTER — Ambulatory Visit (HOSPITAL_BASED_OUTPATIENT_CLINIC_OR_DEPARTMENT_OTHER)
Admission: RE | Admit: 2020-09-18 | Discharge: 2020-09-18 | Disposition: A | Discharge status: Home / Self Care | Payer: PPO | Attending: Orthopedic Surgery | Admitting: Orthopedic Surgery

## 2020-09-18 ENCOUNTER — Other Ambulatory Visit: Payer: Self-pay

## 2020-09-18 ENCOUNTER — Encounter (HOSPITAL_BASED_OUTPATIENT_CLINIC_OR_DEPARTMENT_OTHER): Payer: Self-pay | Admitting: Orthopedic Surgery

## 2020-09-18 ENCOUNTER — Ambulatory Visit (HOSPITAL_BASED_OUTPATIENT_CLINIC_OR_DEPARTMENT_OTHER): Payer: PPO | Admitting: Anesthesiology

## 2020-09-18 ENCOUNTER — Ambulatory Visit (HOSPITAL_COMMUNITY): Payer: PPO

## 2020-09-18 ENCOUNTER — Encounter (HOSPITAL_BASED_OUTPATIENT_CLINIC_OR_DEPARTMENT_OTHER)
Admission: RE | Disposition: A | Discharge status: Home / Self Care | Payer: Self-pay | Source: Home / Self Care | Attending: Orthopedic Surgery

## 2020-09-18 DIAGNOSIS — E785 Hyperlipidemia, unspecified: Secondary | ICD-10-CM | POA: Diagnosis not present

## 2020-09-18 DIAGNOSIS — Z791 Long term (current) use of non-steroidal anti-inflammatories (NSAID): Secondary | ICD-10-CM | POA: Insufficient documentation

## 2020-09-18 DIAGNOSIS — Z01818 Encounter for other preprocedural examination: Secondary | ICD-10-CM | POA: Diagnosis not present

## 2020-09-18 DIAGNOSIS — I10 Essential (primary) hypertension: Secondary | ICD-10-CM | POA: Diagnosis not present

## 2020-09-18 DIAGNOSIS — Z881 Allergy status to other antibiotic agents status: Secondary | ICD-10-CM | POA: Diagnosis not present

## 2020-09-18 DIAGNOSIS — M009 Pyogenic arthritis, unspecified: Secondary | ICD-10-CM | POA: Diagnosis not present

## 2020-09-18 DIAGNOSIS — Y838 Other surgical procedures as the cause of abnormal reaction of the patient, or of later complication, without mention of misadventure at the time of the procedure: Secondary | ICD-10-CM | POA: Diagnosis not present

## 2020-09-18 DIAGNOSIS — T8140XA Infection following a procedure, unspecified, initial encounter: Secondary | ICD-10-CM | POA: Insufficient documentation

## 2020-09-18 DIAGNOSIS — B999 Unspecified infectious disease: Secondary | ICD-10-CM

## 2020-09-18 DIAGNOSIS — Z880 Allergy status to penicillin: Secondary | ICD-10-CM | POA: Insufficient documentation

## 2020-09-18 DIAGNOSIS — Z7982 Long term (current) use of aspirin: Secondary | ICD-10-CM | POA: Insufficient documentation

## 2020-09-18 DIAGNOSIS — Z79899 Other long term (current) drug therapy: Secondary | ICD-10-CM | POA: Insufficient documentation

## 2020-09-18 DIAGNOSIS — K219 Gastro-esophageal reflux disease without esophagitis: Secondary | ICD-10-CM | POA: Diagnosis not present

## 2020-09-18 DIAGNOSIS — T8149XA Infection following a procedure, other surgical site, initial encounter: Secondary | ICD-10-CM | POA: Diagnosis not present

## 2020-09-18 HISTORY — DX: Essential (primary) hypertension: I10

## 2020-09-18 HISTORY — PX: IRRIGATION AND DEBRIDEMENT ELBOW: SHX6886

## 2020-09-18 IMAGING — CR DG ELBOW 2V*R*
2 series · 2 of 2 positions shown · non-contrast
Comparison: [DATE].

CLINICAL DATA: Preop for right elbow surgery.

EXAM:
RIGHT ELBOW - 2 VIEW

[elbow ap]
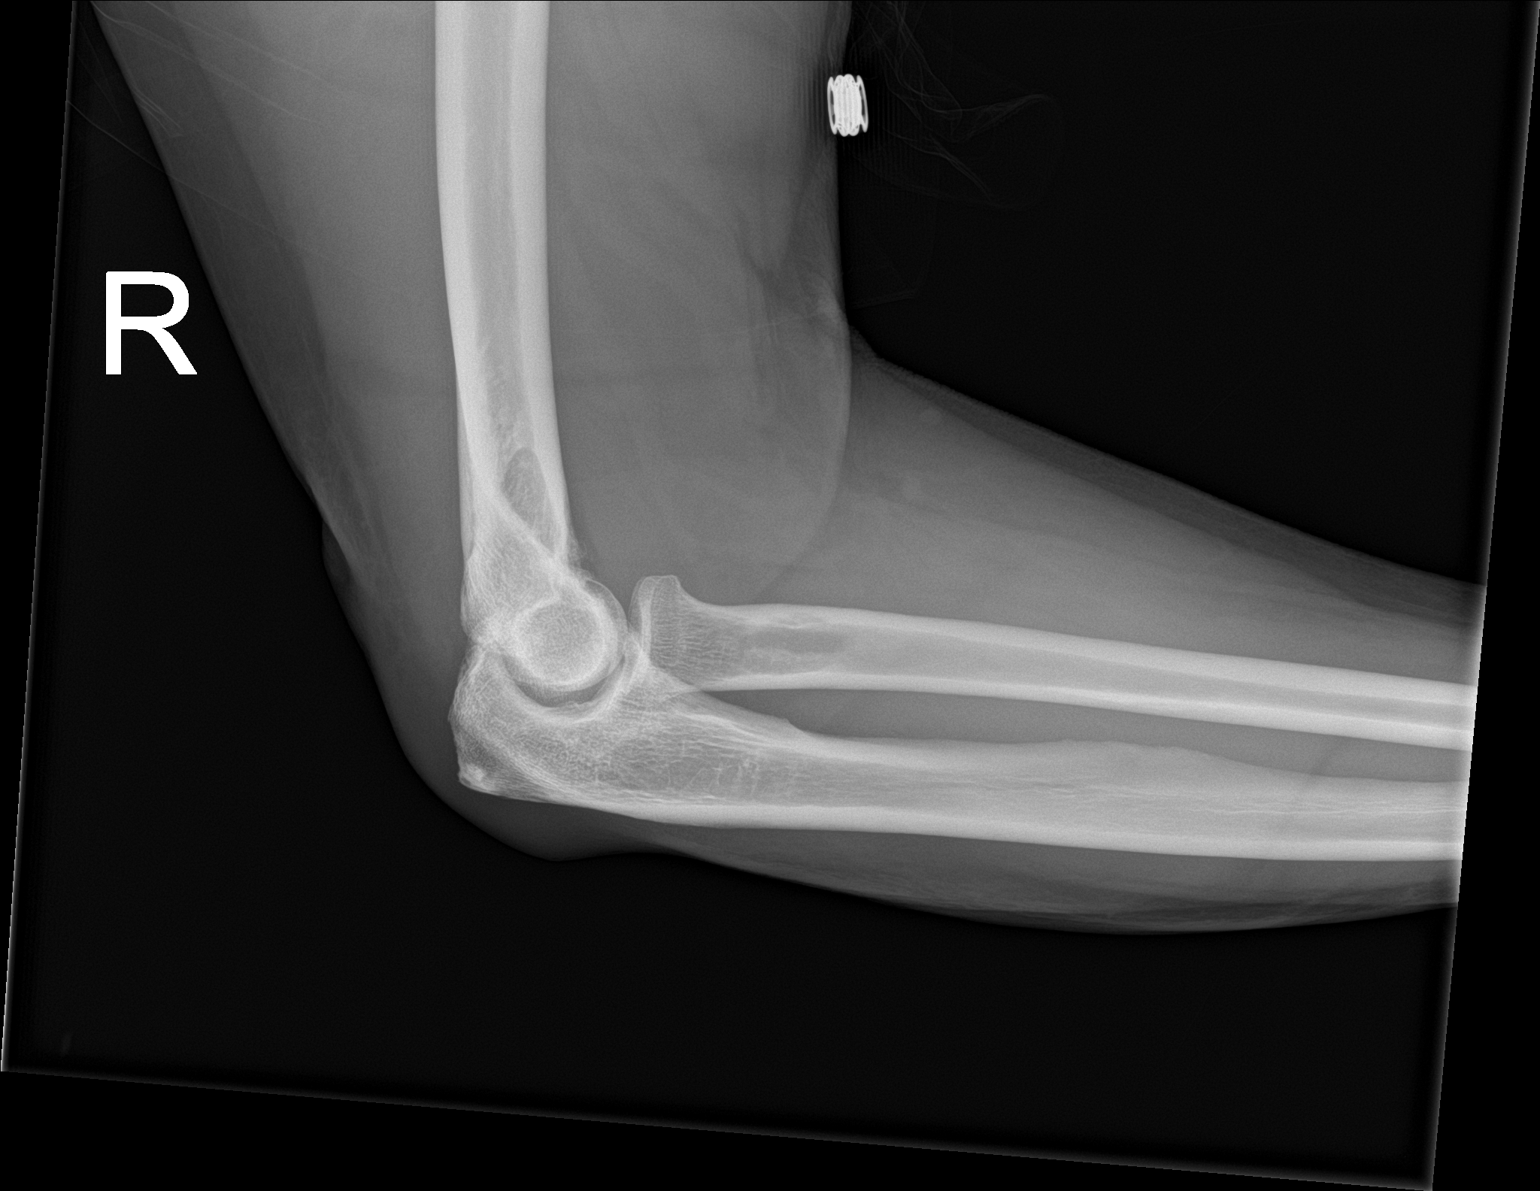

[elbow lat]
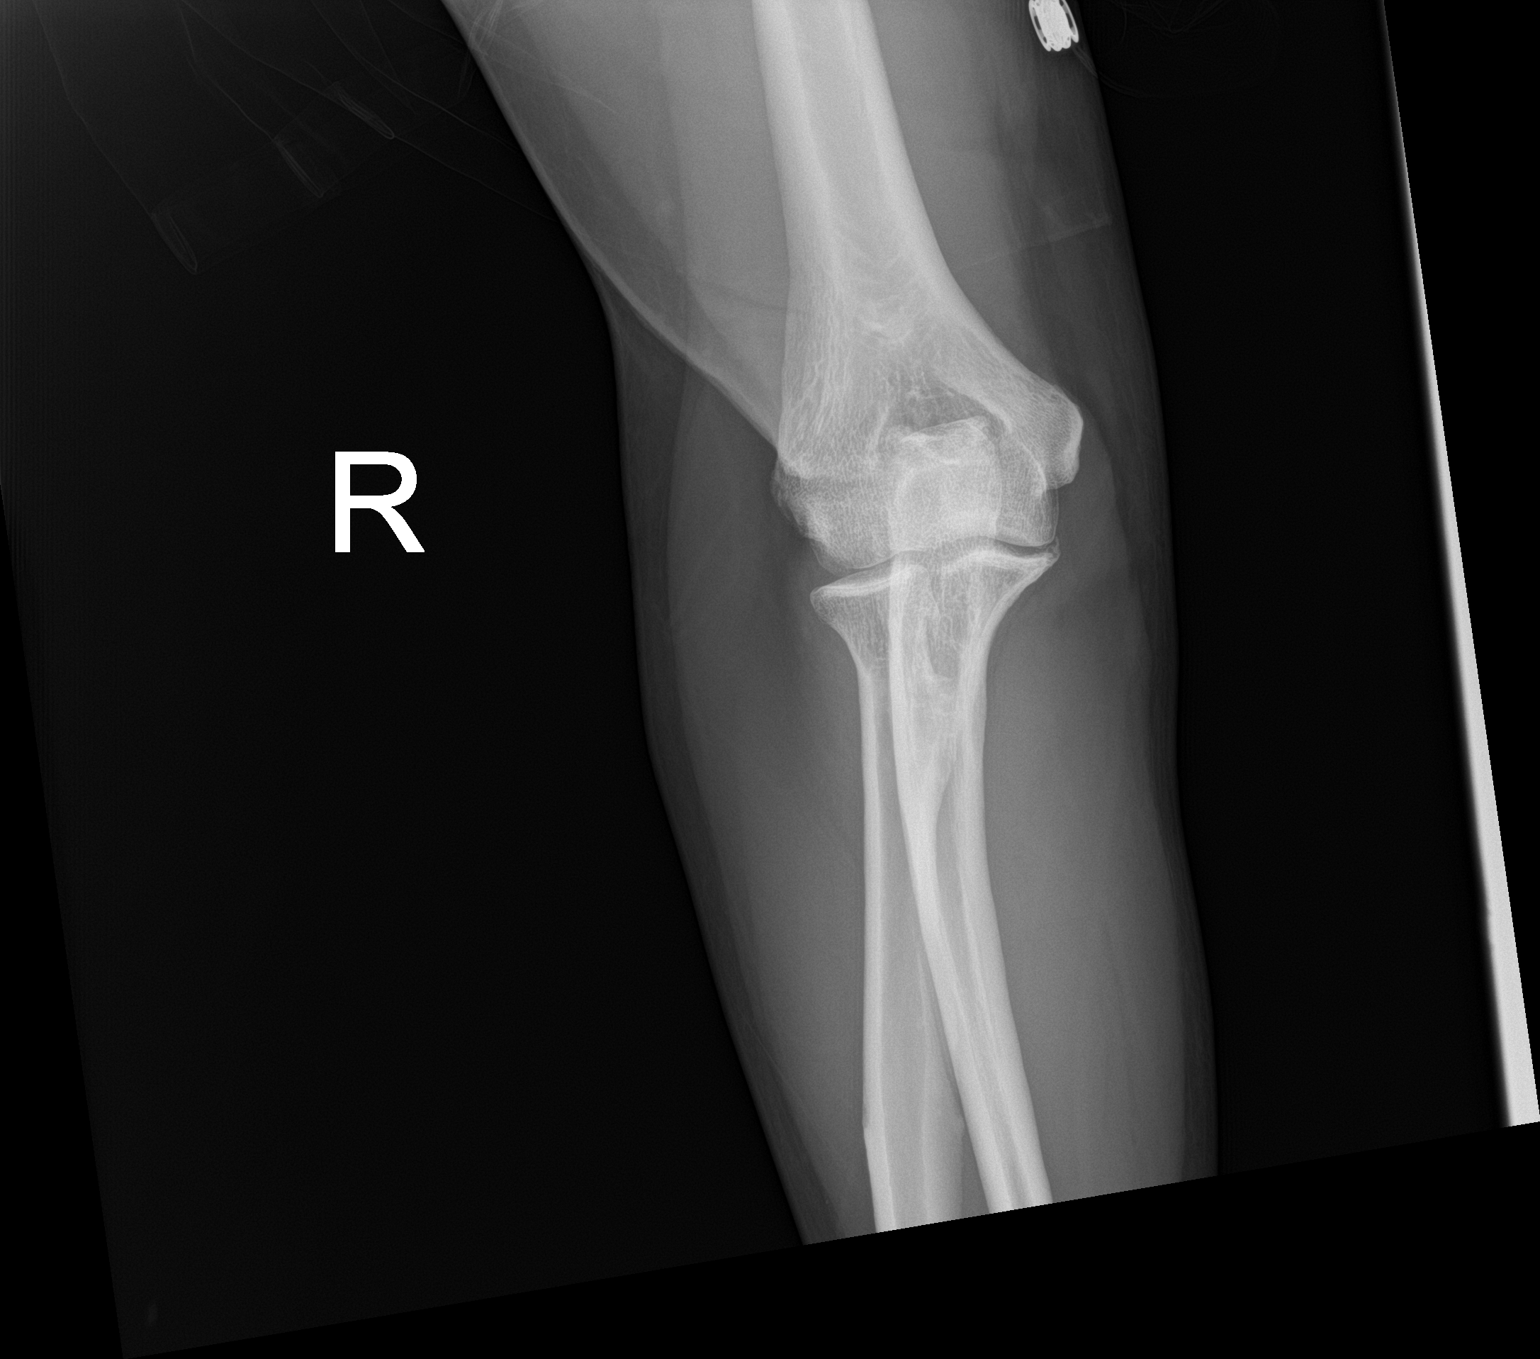

[2 of 2 positions shown; findings below may reference images not displayed]

FINDINGS: There is no evidence of fracture, dislocation, or joint effusion.
There is no evidence of arthropathy or other focal bone abnormality.
Soft tissues are unremarkable.
IMPRESSION: Negative.

## 2020-09-18 SURGERY — IRRIGATION AND DEBRIDEMENT ELBOW
Anesthesia: Monitor Anesthesia Care | Site: Elbow | Laterality: Right

## 2020-09-18 MED ORDER — ONDANSETRON HCL 4 MG/2ML IJ SOLN
INTRAMUSCULAR | Status: AC
  Filled 2020-09-18: qty 2

## 2020-09-18 MED ORDER — FENTANYL CITRATE (PF) 100 MCG/2ML IJ SOLN
INTRAMUSCULAR | Status: AC
  Filled 2020-09-18: qty 2

## 2020-09-18 MED ORDER — DEXAMETHASONE SODIUM PHOSPHATE 10 MG/ML IJ SOLN
INTRAMUSCULAR | Status: AC
  Filled 2020-09-18: qty 1

## 2020-09-18 MED ORDER — EPHEDRINE 5 MG/ML INJ
INTRAVENOUS | Status: AC
  Filled 2020-09-18: qty 10

## 2020-09-18 MED ORDER — ONDANSETRON HCL 4 MG/2ML IJ SOLN: 4.0000 mg | Freq: Once | INTRAMUSCULAR | Status: DC | PRN

## 2020-09-18 MED ORDER — ONDANSETRON HCL 4 MG/2ML IJ SOLN
INTRAMUSCULAR | Status: DC | PRN
  Administered 2020-09-18: 4 mg via INTRAVENOUS

## 2020-09-18 MED ORDER — FENTANYL CITRATE (PF) 100 MCG/2ML IJ SOLN
INTRAMUSCULAR | Status: DC | PRN
  Administered 2020-09-18: 50 ug via INTRAVENOUS

## 2020-09-18 MED ORDER — LACTATED RINGERS IV SOLN: INTRAVENOUS | Status: DC

## 2020-09-18 MED ORDER — FENTANYL CITRATE (PF) 100 MCG/2ML IJ SOLN
100.0000 ug | Freq: Once | INTRAMUSCULAR | Status: AC
  Administered 2020-09-18: 50 ug via INTRAVENOUS

## 2020-09-18 MED ORDER — HYDROCODONE-ACETAMINOPHEN 5-325 MG PO TABS: 1.0000 | ORAL_TABLET | ORAL | 0 refills | Status: DC | PRN

## 2020-09-18 MED ORDER — MIDAZOLAM HCL 2 MG/2ML IJ SOLN
2.0000 mg | Freq: Once | INTRAMUSCULAR | Status: AC
  Administered 2020-09-18: 1 mg via INTRAVENOUS

## 2020-09-18 MED ORDER — LIDOCAINE 2% (20 MG/ML) 5 ML SYRINGE
INTRAMUSCULAR | Status: DC | PRN
  Administered 2020-09-18: 60 mg via INTRAVENOUS

## 2020-09-18 MED ORDER — PROPOFOL 500 MG/50ML IV EMUL
INTRAVENOUS | Status: DC | PRN
  Administered 2020-09-18: 150 ug/kg/min via INTRAVENOUS

## 2020-09-18 MED ORDER — MIDAZOLAM HCL 2 MG/2ML IJ SOLN
INTRAMUSCULAR | Status: AC
  Filled 2020-09-18: qty 2

## 2020-09-18 MED ORDER — CEPHALEXIN 500 MG PO CAPS: 500.0000 mg | ORAL_CAPSULE | Freq: Four times a day (QID) | ORAL | 0 refills | Status: AC

## 2020-09-18 MED ORDER — SUGAMMADEX SODIUM 500 MG/5ML IV SOLN
INTRAVENOUS | Status: AC
  Filled 2020-09-18: qty 5

## 2020-09-18 MED ORDER — VANCOMYCIN HCL IN DEXTROSE 1-5 GM/200ML-% IV SOLN
INTRAVENOUS | Status: AC
  Filled 2020-09-18: qty 200

## 2020-09-18 MED ORDER — VANCOMYCIN HCL IN DEXTROSE 1-5 GM/200ML-% IV SOLN
1000.0000 mg | INTRAVENOUS | Status: AC
  Administered 2020-09-18: 1000 mg via INTRAVENOUS

## 2020-09-18 MED ORDER — LIDOCAINE 2% (20 MG/ML) 5 ML SYRINGE
INTRAMUSCULAR | Status: AC
  Filled 2020-09-18: qty 5

## 2020-09-18 MED ORDER — FENTANYL CITRATE (PF) 100 MCG/2ML IJ SOLN: 25.0000 ug | INTRAMUSCULAR | Status: DC | PRN

## 2020-09-18 MED ORDER — PROPOFOL 10 MG/ML IV BOLUS
INTRAVENOUS | Status: AC
  Filled 2020-09-18: qty 20

## 2020-09-18 SURGICAL SUPPLY — 64 items
APL PRP STRL LF DISP 70% ISPRP (MISCELLANEOUS) ×1
BLADE SURG 15 STRL LF DISP TIS (BLADE) ×1 IMPLANT
BLADE SURG 15 STRL SS (BLADE) ×4
BNDG CMPR 9X4 STRL LF SNTH (GAUZE/BANDAGES/DRESSINGS)
BNDG COHESIVE 1X5 TAN STRL LF (GAUZE/BANDAGES/DRESSINGS) IMPLANT
BNDG COHESIVE 2X5 TAN STRL LF (GAUZE/BANDAGES/DRESSINGS) IMPLANT
BNDG COHESIVE 4X5 TAN STRL (GAUZE/BANDAGES/DRESSINGS) ×1 IMPLANT
BNDG ELASTIC 2X5.8 VLCR STR LF (GAUZE/BANDAGES/DRESSINGS) IMPLANT
BNDG ELASTIC 3X5.8 VLCR STR LF (GAUZE/BANDAGES/DRESSINGS) IMPLANT
BNDG ELASTIC 4X5.8 VLCR STR LF (GAUZE/BANDAGES/DRESSINGS) ×2 IMPLANT
BNDG ESMARK 4X9 LF (GAUZE/BANDAGES/DRESSINGS) IMPLANT
BNDG GAUZE 1X2.1 STRL (MISCELLANEOUS) IMPLANT
BNDG GAUZE ELAST 4 BULKY (GAUZE/BANDAGES/DRESSINGS) ×1 IMPLANT
CHLORAPREP W/TINT 26 (MISCELLANEOUS) ×2 IMPLANT
CORD BIPOLAR FORCEPS 12FT (ELECTRODE) ×1 IMPLANT
COVER BACK TABLE 60X90IN (DRAPES) ×2 IMPLANT
COVER WAND RF STERILE (DRAPES) IMPLANT
CUFF TOURN SGL QUICK 18X4 (TOURNIQUET CUFF) ×1 IMPLANT
DECANTER SPIKE VIAL GLASS SM (MISCELLANEOUS) IMPLANT
DRAIN PENROSE 1/4X12 LTX STRL (WOUND CARE) ×1 IMPLANT
DRAPE EXTREMITY T 121X128X90 (DISPOSABLE) ×2 IMPLANT
DRAPE IMP U-DRAPE 54X76 (DRAPES) ×2 IMPLANT
DRAPE SURG 17X23 STRL (DRAPES) ×2 IMPLANT
DRSG ADAPTIC 3X8 NADH LF (GAUZE/BANDAGES/DRESSINGS) ×1 IMPLANT
DRSG PAD ABDOMINAL 8X10 ST (GAUZE/BANDAGES/DRESSINGS) ×1 IMPLANT
ELECT REM PT RETURN 9FT ADLT (ELECTROSURGICAL) ×2
ELECTRODE REM PT RTRN 9FT ADLT (ELECTROSURGICAL) IMPLANT
GAUZE SPONGE 4X4 12PLY STRL (GAUZE/BANDAGES/DRESSINGS) ×2 IMPLANT
GAUZE XEROFORM 1X8 LF (GAUZE/BANDAGES/DRESSINGS) ×1 IMPLANT
GLOVE BIO SURGEON STRL SZ7.5 (GLOVE) ×2 IMPLANT
GLOVE SRG 8 PF TXTR STRL LF DI (GLOVE) ×1 IMPLANT
GLOVE SURG ENC MOIS LTX SZ7 (GLOVE) ×2 IMPLANT
GLOVE SURG SS PI 8.0 STRL IVOR (GLOVE) ×2 IMPLANT
GLOVE SURG UNDER POLY LF SZ7 (GLOVE) ×3 IMPLANT
GLOVE SURG UNDER POLY LF SZ8 (GLOVE) ×2
GOWN STRL REUS W/ TWL LRG LVL3 (GOWN DISPOSABLE) ×1 IMPLANT
GOWN STRL REUS W/ TWL XL LVL3 (GOWN DISPOSABLE) ×1 IMPLANT
GOWN STRL REUS W/TWL 2XL LVL3 (GOWN DISPOSABLE) ×1 IMPLANT
GOWN STRL REUS W/TWL LRG LVL3 (GOWN DISPOSABLE) ×2
GOWN STRL REUS W/TWL XL LVL3 (GOWN DISPOSABLE) ×2
NDL HYPO 25X1 1.5 SAFETY (NEEDLE) IMPLANT
NEEDLE HYPO 25X1 1.5 SAFETY (NEEDLE) IMPLANT
NS IRRIG 1000ML POUR BTL (IV SOLUTION) ×2 IMPLANT
PACK BASIN DAY SURGERY FS (CUSTOM PROCEDURE TRAY) ×2 IMPLANT
PAD CAST 3X4 CTTN HI CHSV (CAST SUPPLIES) IMPLANT
PAD CAST 4YDX4 CTTN HI CHSV (CAST SUPPLIES) IMPLANT
PADDING CAST ABS 4INX4YD NS (CAST SUPPLIES) ×1
PADDING CAST ABS COTTON 4X4 ST (CAST SUPPLIES) ×1 IMPLANT
PADDING CAST COTTON 2X4 NS (CAST SUPPLIES) IMPLANT
PADDING CAST COTTON 3X4 STRL (CAST SUPPLIES)
PADDING CAST COTTON 4X4 STRL (CAST SUPPLIES)
PENCIL SMOKE EVACUATOR (MISCELLANEOUS) ×1 IMPLANT
SLING ARM FOAM STRAP LRG (SOFTGOODS) ×1 IMPLANT
SPONGE LAP 18X18 RF (DISPOSABLE) ×1 IMPLANT
STOCKINETTE IMPERVIOUS LG (DRAPES) ×1 IMPLANT
SUT ETHILON 4 0 PS 2 18 (SUTURE) IMPLANT
SUT PROLENE 2 0 CT2 30 (SUTURE) ×1 IMPLANT
SUT TIGER TAPE 7 IN WHITE (SUTURE) IMPLANT
SYR BULB EAR ULCER 3OZ GRN STR (SYRINGE) ×1 IMPLANT
SYR CONTROL 10ML LL (SYRINGE) IMPLANT
TAPE FIBER 2MM 7IN #2 BLUE (SUTURE) IMPLANT
TOWEL GREEN STERILE FF (TOWEL DISPOSABLE) ×2 IMPLANT
TUBE CONNECTING 20X1/4 (TUBING) ×1 IMPLANT
UNDERPAD 30X36 HEAVY ABSORB (UNDERPADS AND DIAPERS) ×1 IMPLANT

## 2020-09-18 NOTE — Op Note (Signed)
Procedure(s): IRRIGATION AND DEBRIDEMENT ELBOW WITH SUTURE REMOVAL Procedure Note  Eric Rhodes male 65 y.o. 09/18/2020   Preoperative diagnosis: Recurrent infection right elbow involving olecranon bursa, deep tendon and bone  Postoperative diagnosis: Same  Procedure(s) and Anesthesia Type: Irrigation and debridement right elbow infection involving skin and subcutaneous tissue deep fascia and bone with suture removal  Surgeon(s) and Role:    Tania Ade, MD - Primary   Indications:  65 y.o. male with right posterior elbow recurrent infection after triceps repair.  MRI shows some bony involvement.  Indicated for irrigation debridement with suture removal.     Surgeon: Isabella Stalling   Assistants: Jeanmarie Hubert PA-C (Danielle was present and scrubbed throughout the procedure and was essential in positioning, retraction, exposure, and closure)  Anesthesia: Regional/MAC   Procedure Detail  IRRIGATION AND DEBRIDEMENT ELBOW WITH SUTURE REMOVAL  Findings: There appeared to be 2 main foci of infection corresponding to the areas of sinus tracts on the skin.  This related to the distal aspect of the suture repair on the posterior proximal ulna and the sutures more proximally.  There was a bone tunnel where the sutures were passed which had been widened out to approximately 4 mm diameter.  There was just a thin bridge posteriorly which was excised.  All suture was removed.  Estimated Blood Loss:  Minimal         Drains: none  Blood Given: none         Specimens: none        Complications:  * No complications entered in OR log *  Tourniquet time: 30 min at 250 mmHg         Disposition: PACU - hemodynamically stable.         Condition: stable    Procedure: The patient was brought to the operating room and was placed supine on the operative table.  Regional/MAC anesthesia was used.  A nonsterile tourniquet was applied and the operative extremity was  prepped and draped in the standard sterile fashion.  The limb was exsanguinated with elevation and the tourniquet was elevated to 250 mm mercury.  The previous incision was marked out in the 2 previous sinus tracts were excised with the incision which was approximately 8 cm in length.  There was pus noted at both sinus tract sites.  Careful dissection was made down to the posterior triceps and the posterior proximal ulna.  There was noted to be a enlarged tract where the sutures passed through the proximal ulna at an oblique angle.  The suture still traverse this.  There was pus noted here.  Sutures were removed from the tunnel and then also removed completely from the proximal aspect of the tendon.  The tendon repair overall did seem to be intact although centrally at the posterior aspect of the olecranon there was a defect where the bone tunnel was involved.  After all suture was removed the loose tissue in the bursal space was also excised getting back to good healthy tendon and bone.  A small curette and rondure were used to debride the bony tract.  There was really just a very thin cortical roof and I felt that in order to try to completely eradicate the infection that removing this was indicated.  Therefore the rondure was used to remove the small shell.  The remaining bone was again extensively debrided with a small curette.  The remaining bone was well-corticated and there was no soft or spongy bone.  The entire field was then copiously irrigated with normal saline and subsequently 1/4 inch Penrose drain was placed out subcutaneously laterally.  Wound was then closed using 2-0 Prolene in an interrupted horizontal mattress fashion.  Sterile dressings were applied including Adaptic 4 x 4's Curlex dressing and a lightly wrapped Ace dressing. The tourniquet was let down. The fingers were pink and warm.  The patient was then awakened transferred to a stretcher and taken to the recovery room in stable  condition.  Postoperative plan: We will put him back on Keflex for OSSA.  We will discuss his case with infectious disease and likely will need a prolonged course of IV antibiotics given the bony involvement.

## 2020-09-18 NOTE — Anesthesia Postprocedure Evaluation (Signed)
Anesthesia Post Note  Patient: Eric Rhodes  Procedure(s) Performed: IRRIGATION AND DEBRIDEMENT ELBOW WITH SUTURE REMOVAL (Right Elbow)     Patient location during evaluation: PACU Anesthesia Type: Regional and MAC Level of consciousness: awake and alert Pain management: pain level controlled Vital Signs Assessment: post-procedure vital signs reviewed and stable Respiratory status: spontaneous breathing, nonlabored ventilation and respiratory function stable Cardiovascular status: blood pressure returned to baseline and stable Postop Assessment: no apparent nausea or vomiting Anesthetic complications: no   No complications documented.  Last Vitals:  Vitals:   09/18/20 1400 09/18/20 1415  BP: (!) 94/56 108/60  Pulse: 62 60  Resp: 15 17  Temp:    SpO2: 94% 96%    Last Pain:  Vitals:   09/18/20 1358  TempSrc:   PainSc: 0-No pain                 Pervis Hocking

## 2020-09-18 NOTE — H&P (Signed)
Eric Rhodes is an 65 y.o. male.   Chief Complaint: R elbow recurrent infection HPI: s/p Triceps repair with recurrent infection  Past Medical History:  Diagnosis Date  . BPH (benign prostatic hyperplasia)   . Chronic abdominal pain   . Chronic insomnia   . GERD (gastroesophageal reflux disease)    diet controlled  . H/O ETOH abuse   . Hernia, inguinal, left   . History of colonoscopy 2004  . History of endoscopy 2004  . Hypertension   . Irritable bowel disease   . Prostatitis     Past Surgical History:  Procedure Laterality Date  . ANTERIOR CRUCIATE LIGAMENT REPAIR Left 1978  . BUNIONECTOMY    . COLON MEDOFF    . COLONOSCOPY    . HEMORRHOIDAL BANDING  10/2014  . HERNIA REPAIR Left 11/2011  . INGUINAL HERNIA REPAIR  11/07/2011   Procedure: HERNIA REPAIR INGUINAL ADULT;  Surgeon: Shann Medal, MD;  Location: [facility];  Service: General;  Laterality: Left;  Left Inguinal Herniorrhaphy with Mesh  . IRRIGATION AND DEBRIDEMENT ELBOW Right 05/22/2020   Procedure: IRRIGATION AND DEBRIDEMENT RIGHT ELBOW;  Surgeon: Tania Ade, MD;  Location: [facility];  Service: Orthopedics;  Laterality: Right;  . UPPER GASTROINTESTINAL ENDOSCOPY    . WISDOM TOOTH EXTRACTION      Family History  Problem Relation Age of Onset  . Heart disease Mother        Energy manager  . Myelodysplastic syndrome Mother        chronic  . Hypertension Mother   . Clotting disorder Mother   . Hypercholesterolemia Mother   . COPD Father        respiratory failure  . Hypertension Father   . Diabetes Brother   . Psoriasis Brother   . Obesity Brother   . Heart attack Brother   . ADD / ADHD Son   . ADD / ADHD Son    Social History:  reports that he has never smoked. He has never used smokeless tobacco. He reports that he does not drink alcohol and does not use drugs.  Allergies:  Allergies  Allergen Reactions  . Augmentin [Amoxicillin-Pot Clavulanate] Other (See Comments)     Causes GI cramps  . Bacitracin Other (See Comments)    Positive patch test  . Roseanne Kaufman Other (See Comments)    Positive patch test  . Benzyl Cinnamate Other (See Comments)    Positive patch test  . Betadine [Povidone Iodine] Other (See Comments)    Skin sensitive  . Clavulanic Acid Other (See Comments)    GI Cramps  . Gold Sodium Thiosulfate Other (See Comments)    Positive patch test  . Lanolin Other (See Comments)    Positive patch test  . Limonene Other (See Comments)    Positive patch test  . Linalool Other (See Comments)    Positive patch test  . Methylisothiazolinone Other (See Comments)    Positive patch test    . Other Other (See Comments)    Cinnamic Alcohol-Positive patch test Cinnamic Aldehyde-Positive patch test Compositae Mix-Positive patch test Dimethylaminopropylamine-Positive patch test Disperse Dye Mix-Positive patch test Quaternium-15-Positive patch test Fragrance mix I-Positive patch test Fragrance mix II-Positive patch test  . Tea Tree Oil Other (See Comments)    Positive patch test  . Neosporin [Neomycin-Bacitracin Zn-Polymyx] Rash    Contact dermatitis    Medications Prior to Admission  Medication Sig Dispense Refill  . aspirin 81 MG EC tablet Take 81 mg by  mouth at bedtime. Swallow whole.    . celecoxib (CELEBREX) 200 MG capsule Take 200 mg by mouth daily as needed (pain).    . cetirizine (ZYRTEC) 10 MG tablet Take 10 mg by mouth daily as needed (seasonal allergies).     . cholecalciferol (VITAMIN D) 1000 units tablet Take 1,000 Units by mouth daily.    Marland Kitchen co-enzyme Q-10 30 MG capsule Take 30 mg by mouth daily.    Marland Kitchen losartan (COZAAR) 25 MG tablet TAKE ONE TABLET BY MOUTH ONCE DAILY 90 tablet 2  . metoprolol tartrate (LOPRESSOR) 25 MG tablet TAKE 1/2 TABLET BY MOUTH TWICE DAILY 90 tablet 2  . Multiple Vitamin (MULTIVITAMIN WITH MINERALS) TABS tablet Take 1 tablet by mouth daily.    . Omega-3 Fatty Acids (FISH OIL PO) Take 1 capsule by mouth 2 (two)  times daily.    Marland Kitchen REPATHA SURECLICK 749 MG/ML SOAJ INJECT THE CONTENTS OF 1 SYRINGE SUBCUTANEOUSLY EVERY 14 DAYS (Patient taking differently: Inject 140 mg into the skin every 14 (fourteen) days. Every other Wednesday) 6 pen 3  . rosuvastatin (CRESTOR) 5 MG tablet Take 5 mg by mouth at bedtime.     . triamcinolone ointment (KENALOG) 0.1 % Apply 1 application topically 2 (two) times daily as needed (rash).     . vitamin B-12 (CYANOCOBALAMIN) 100 MCG tablet Take 100 mcg by mouth daily.      No results found for this or any previous visit (from the past 48 hour(s)). No results found.  Review of Systems  All other systems reviewed and are negative.   Height 5' 3.5" (1.613 m), weight 71.7 kg. Physical Exam Constitutional:      Appearance: He is well-developed and well-nourished.  HENT:     Head: Atraumatic.  Eyes:     Extraocular Movements: EOM normal.  Cardiovascular:     Pulses: Intact distal pulses.  Pulmonary:     Effort: Pulmonary effort is normal.  Musculoskeletal:     Comments: R elbow mild posterior swelling/ warmth   Skin:    General: Skin is warm and dry.  Neurological:     Mental Status: He is alert and oriented to person, place, and time.  Psychiatric:        Mood and Affect: Mood and affect normal.      Assessment/Plan s/p Triceps repair with recurrent infection Plan repeat I&D with suture removal Risks / benefits of surgery discussed Consent on chart  NPO for OR Preop antibiotics   Isabella Stalling, MD 09/18/2020, 11:31 AM

## 2020-09-18 NOTE — Discharge Instructions (Signed)
Discharge Instructions after Open Elbow Surgery   Okay to d/c sling when comfortable Use ice on the elbow intermittently over the first 48 hours after surgery. Pain medicine has been prescribed for you.  Use your medicine liberally over the first 48 hours, and then you can begin to taper your use. You may take Extra Strength Tylenol or Tylenol only in place of the pain pills.  Okay to remove dressing after 5 days and redress with clean dressing You may shower after surgery.  Wrap the dressing with a dry towel and place your arm in a large trash bag.  Take antibiotics are prescribed   Please call [phone] during normal business hours or [phone] after hours for any problems. Including the following:  - excessive redness of the incisions - drainage for more than 4 days - fever of more than 101.5 F  *Please note that pain medications will not be refilled after hours or on weekends.     Post Anesthesia Home Care Instructions  Activity: Get plenty of rest for the remainder of the day. A responsible individual must stay with you for 24 hours following the procedure.  For the next 24 hours, DO NOT: -Drive a car -Paediatric nurse -Drink alcoholic beverages -Take any medication unless instructed by your physician -Make any legal decisions or sign important papers.  Meals: Start with liquid foods such as gelatin or soup. Progress to regular foods as tolerated. Avoid greasy, spicy, heavy foods. If nausea and/or vomiting occur, drink only clear liquids until the nausea and/or vomiting subsides. Call your physician if vomiting continues.  Special Instructions/Symptoms: Your throat may feel dry or sore from the anesthesia or the breathing tube placed in your throat during surgery. If this causes discomfort, gargle with warm salt water. The discomfort should disappear within 24 hours.  If you had a scopolamine patch placed behind your ear for the management of post- operative nausea  and/or vomiting:  1. The medication in the patch is effective for 72 hours, after which it should be removed.  Wrap patch in a tissue and discard in the trash. Wash hands thoroughly with soap and water. 2. You may remove the patch earlier than 72 hours if you experience unpleasant side effects which may include dry mouth, dizziness or visual disturbances. 3. Avoid touching the patch. Wash your hands with soap and water after contact with the patch.     Regional Anesthesia Blocks  1. Numbness or the inability to move the "blocked" extremity may last from 3-48 hours after placement. The length of time depends on the medication injected and your individual response to the medication. If the numbness is not going away after 48 hours, call your surgeon.  2. The extremity that is blocked will need to be protected until the numbness is gone and the  Strength has returned. Because you cannot feel it, you will need to take extra care to avoid injury. Because it may be weak, you may have difficulty moving it or using it. You may not know what position it is in without looking at it while the block is in effect.  3. For blocks in the legs and feet, returning to weight bearing and walking needs to be done carefully. You will need to wait until the numbness is entirely gone and the strength has returned. You should be able to move your leg and foot normally before you try and bear weight or walk. You will need someone to be with you when you  first try to ensure you do not fall and possibly risk injury.  4. Bruising and tenderness at the needle site are common side effects and will resolve in a few days.  5. Persistent numbness or new problems with movement should be communicated to the surgeon or the [place] [phone] [place] [phone]).

## 2020-09-18 NOTE — Progress Notes (Signed)
Assisted Dr. Doroteo Glassman with right, ultrasound guided, supraclavicular block. Side rails up, monitors on throughout procedure. See vital signs in flow sheet. Tolerated Procedure well.

## 2020-09-18 NOTE — Anesthesia Procedure Notes (Signed)
Anesthesia Regional Block: Supraclavicular block   Pre-Anesthetic Checklist: ,, timeout performed, Correct Patient, Correct Site, Correct Laterality, Correct Procedure, Correct Position, site marked, Risks and benefits discussed,  Surgical consent,  Pre-op evaluation,  At surgeon's request and post-op pain management  Laterality: Right  Prep: Maximum Sterile Barrier Precautions used, chloraprep       Needles:  Injection technique: Single-shot  Needle Type: Echogenic Stimulator Needle     Needle Length: 9cm  Needle Gauge: 22     Additional Needles:   Procedures:,,,, ultrasound used (permanent image in chart),,,,  Narrative:  Start time: 09/18/2020 12:50 PM End time: 09/18/2020 12:55 PM Injection made incrementally with aspirations every 5 mL.  Performed by: Personally  Anesthesiologist: Pervis Hocking, DO  Additional Notes: Monitors applied. No increased pain on injection. No increased resistance to injection. Injection made in 5cc increments. Good needle visualization. Patient tolerated procedure well.

## 2020-09-18 NOTE — Anesthesia Preprocedure Evaluation (Addendum)
Anesthesia Evaluation    Reviewed: Allergy & Precautions, Patient's Chart, lab work & pertinent test results, reviewed documented beta blocker date and time   Airway Mallampati: I  TM Distance: >3 FB Neck ROM: Full    Dental no notable dental hx. (+) Teeth Intact, Dental Advisory Given   Pulmonary neg pulmonary ROS,    Pulmonary exam normal breath sounds clear to auscultation       Cardiovascular hypertension, Pt. on home beta blockers and Pt. on medications + CAD  Normal cardiovascular exam Rhythm:Regular Rate:Normal     Neuro/Psych PSYCHIATRIC DISORDERS Anxiety negative neurological ROS     GI/Hepatic GERD  Controlled,(+)     substance abuse (hx EtOH abuse)  alcohol use, IBS   Endo/Other  negative endocrine ROS  Renal/GU negative Renal ROS  negative genitourinary   Musculoskeletal Right elbow recurrent infection    Abdominal   Peds  Hematology negative hematology ROS (+)   Anesthesia Other Findings   Reproductive/Obstetrics negative OB ROS                            Anesthesia Physical Anesthesia Plan  ASA: II  Anesthesia Plan: MAC and Regional   Post-op Pain Management:    Induction:   PONV Risk Score and Plan: 2 and Propofol infusion and TIVA  Airway Management Planned: Natural Airway and Simple Face Mask  Additional Equipment: None  Intra-op Plan:   Post-operative Plan:   Informed Consent: I have reviewed the patients History and Physical, chart, labs and discussed the procedure including the risks, benefits and alternatives for the proposed anesthesia with the patient or authorized representative who has indicated his/her understanding and acceptance.       Plan Discussed with: CRNA  Anesthesia Plan Comments:         Anesthesia Quick Evaluation

## 2020-09-18 NOTE — Addendum Note (Signed)
Addendum  created 09/18/20 1438 by Pervis Hocking, DO   Order list changed, Order sets accessed, Pharmacy for encounter modified

## 2020-09-18 NOTE — Anesthesia Procedure Notes (Signed)
Procedure Name: MAC Date/Time: 09/18/2020 1:14 PM Performed by: Lieutenant Diego, CRNA Pre-anesthesia Checklist: Patient identified, Emergency Drugs available, Suction available, Patient being monitored and Timeout performed Patient Re-evaluated:Patient Re-evaluated prior to induction Oxygen Delivery Method: Simple face mask Preoxygenation: Pre-oxygenation with 100% oxygen Induction Type: IV induction

## 2020-09-18 NOTE — Transfer of Care (Signed)
Immediate Anesthesia Transfer of Care Note  Patient: Eric Rhodes  Procedure(s) Performed: IRRIGATION AND DEBRIDEMENT ELBOW WITH SUTURE REMOVAL (Right Elbow)  Patient Location: PACU  Anesthesia Type:MAC  Level of Consciousness: awake  Airway & Oxygen Therapy: Patient Spontanous Breathing and Patient connected to face mask oxygen  Post-op Assessment: Report given to RN and Post -op Vital signs reviewed and stable  Post vital signs: Reviewed and stable  Last Vitals:  Vitals Value Taken Time  BP    Temp    Pulse 63 09/18/20 1356  Resp    SpO2 89 % 09/18/20 1356  Vitals shown include unvalidated device data.  Last Pain:  Vitals:   09/18/20 1147  TempSrc: Oral  PainSc: 0-No pain      Patients Stated Pain Goal: 8 (11/94/17 4081)  Complications: No complications documented.

## 2020-09-19 ENCOUNTER — Telehealth: Payer: Self-pay | Admitting: *Deleted

## 2020-09-19 ENCOUNTER — Encounter (HOSPITAL_BASED_OUTPATIENT_CLINIC_OR_DEPARTMENT_OTHER): Payer: Self-pay | Admitting: Orthopedic Surgery

## 2020-09-19 DIAGNOSIS — M009 Pyogenic arthritis, unspecified: Secondary | ICD-10-CM

## 2020-09-19 NOTE — Telephone Encounter (Signed)
-----   Message from Carlyle Basques, MD sent at 09/18/2020  3:04 PM EST ----- Hey there!  This patient will need picc line ASAP and cefazolin 2gm IV q8hr x 6 wk. Weekly labs. I will place order in his chart. Can you call me to confirm he is getting set up for iv abtx. Also he will need to see someone in clinic as first appt. This is a patient of dr chandler's. Patient will need cbc, bmp, sed rate and crp.. thank you!

## 2020-09-19 NOTE — Telephone Encounter (Signed)
PICC order placed, message left with Anderson Malta in IR. Spoke with Tim at [employer], faxed note and demographics for insurance review. Spoke with patient to let him know of plan, scheduled him with Dr Gale Journey 1/19 at 2:45 as he requested an office visit before the next round of winter weather.  Landis Gandy, RN

## 2020-09-20 ENCOUNTER — Encounter: Payer: Self-pay | Admitting: Internal Medicine

## 2020-09-20 ENCOUNTER — Ambulatory Visit (INDEPENDENT_AMBULATORY_CARE_PROVIDER_SITE_OTHER): Payer: PPO | Admitting: Internal Medicine

## 2020-09-20 ENCOUNTER — Other Ambulatory Visit: Payer: Self-pay

## 2020-09-20 VITALS — BP 124/77 | HR 61 | Temp 98.0°F | Wt 170.0 lb

## 2020-09-20 DIAGNOSIS — M8660 Other chronic osteomyelitis, unspecified site: Secondary | ICD-10-CM | POA: Diagnosis not present

## 2020-09-20 NOTE — Patient Instructions (Signed)
Continue your cephalexin.   Will have home health team place PICC then we can start cefazolin, at which time we can stop the cephalexin   Please do baseline labs today  When you are on iv abx, the hh team will get weekly labs so we can monitor abx toxicity/infection progress    i'll see you again in 4 weeks from now.

## 2020-09-20 NOTE — Telephone Encounter (Signed)
Ashley at [facility] scheduled patient for 1/20 at 2:00, with first dose to be administered after in short stay. RN asked Tim at [employer] for insurance update; he will look into this and call back.  RN made Tim aware that patient is coming to the office today at 2:45, would like an update on the plan. Landis Gandy, RN

## 2020-09-20 NOTE — Progress Notes (Signed)
[facility] for Infectious Disease  Reason for Consult:chronic osteomyelitis  Referring Provider: Tania Ade    Patient Active Problem List   Diagnosis Date Noted  . Joint infection ([place]) 05/20/2020  . Chronic insomnia 05/01/2018  . Anxiety 05/01/2018  . Nonobstructive atherosclerosis of coronary artery 05/01/2018  . Intermittent palpitations 05/01/2018  . GERD (gastroesophageal reflux disease) 05/01/2018  . Chest pain 05/01/2018  . Hyperlipidemia 12/18/2017  . Coronary artery calcification 12/18/2017  . Precordial chest pain 06/18/2017  . Inguinal hernia, left 09/27/2011      HPI: Eric Rhodes is a 65 y.o. male MDS, obese, psoriasis, dm2, CAD, s/p right elbow tricep tendon repair complicated by recurrent infection, referred here by ortho for comanagement of same  He underwent I&D again on 1/17; I reviewed op note "I&D of skin, sq tissue, deep fascia, and bone, with suture removal." unfortunately no cultures were sent "There appeared to be 2 main foci of infection corresponding to the areas of sinus tracts on the skin.  This related to the distal aspect of the suture repair on the posterior proximal ulna and the sutures more proximally.  There was a bone tunnel where the sutures were passed which had been widened out to approximately 4 mm diameter.  There was just a thin bridge posteriorly which was excised.  All suture was removed."  I also reviewed 9/20 op note of the first I&D. No suture removed at that time. Surgical culture grew mssa pan sensitive. The wound initially healed but then dehisced, which then took 4-5 weeks to close up. The area appear ok for 6 weeks but then flared up again.   He appears to have the tricep repair 6 weeks prior to 05/2019. After that has had pain/redness/swelling/discharge. These don't improve with oral abx prompting the I&D   He understood that all the nondissolveable sutures were removed on 1/17 procedure. I am asking  him to ascertain that today with his surgeon  He initially fell on his right elbow prompting the repair  Since surgery, he is doing well. No f/c (had some during 05/2020). He is taking cephalexin 500mg  4 times a day   His abx course: Before 05/22/20 cephalexin for 1-2 days, then surgery/postop vanc 4 days, then 7 days levofloxacin Another course of levo then cephalexin then doxycycline. The doxy was 2 weeks until 09/18/2020  No diarrhea/rash. There is minimal pain in the wound. The drain site previously is draining serosanguinous fluid.  Review of Systems: ROS Other ros negative   abx side effect previously. Bactrim -- didn't cause any rash, but after he used it he developed contact dermatitis to many material amox-clav -- gi cramping     Past Medical History:  Diagnosis Date  . BPH (benign prostatic hyperplasia)   . Chronic abdominal pain   . Chronic insomnia   . GERD (gastroesophageal reflux disease)    diet controlled  . H/O ETOH abuse   . Hernia, inguinal, left   . History of colonoscopy 2004  . History of endoscopy 2004  . Hypertension   . Irritable bowel disease   . Prostatitis     Social History   Tobacco Use  . Smoking status: Never Smoker  . Smokeless tobacco: Never Used  Vaping Use  . Vaping Use: Never used  Substance Use Topics  . Alcohol use: No  . Drug use: No    Family History  Problem Relation Age of Onset  . Heart disease Mother  Bradycardia/pacemaker  . Myelodysplastic syndrome Mother        chronic  . Hypertension Mother   . Clotting disorder Mother   . Hypercholesterolemia Mother   . COPD Father        respiratory failure  . Hypertension Father   . Diabetes Brother   . Psoriasis Brother   . Obesity Brother   . Heart attack Brother   . ADD / ADHD Son   . ADD / ADHD Son     Allergies  Allergen Reactions  . Augmentin [Amoxicillin-Pot Clavulanate] Other (See Comments)    Causes GI cramps  . Bacitracin Other (See Comments)     Positive patch test  . Roseanne Kaufman Other (See Comments)    Positive patch test  . Benzyl Cinnamate Other (See Comments)    Positive patch test  . Betadine [Povidone Iodine] Other (See Comments)    Skin sensitive  . Clavulanic Acid Other (See Comments)    GI Cramps  . Gold Sodium Thiosulfate Other (See Comments)    Positive patch test  . Lanolin Other (See Comments)    Positive patch test  . Limonene Other (See Comments)    Positive patch test  . Linalool Other (See Comments)    Positive patch test  . Methylisothiazolinone Other (See Comments)    Positive patch test    . Other Other (See Comments)    Cinnamic Alcohol-Positive patch test Cinnamic Aldehyde-Positive patch test Compositae Mix-Positive patch test Dimethylaminopropylamine-Positive patch test Disperse Dye Mix-Positive patch test Quaternium-15-Positive patch test Fragrance mix I-Positive patch test Fragrance mix II-Positive patch test  . Tea Tree Oil Other (See Comments)    Positive patch test  . Neosporin [Neomycin-Bacitracin Zn-Polymyx] Rash    Contact dermatitis    OBJECTIVE: There were no vitals filed for this visit. There is no height or weight on file to calculate BMI.   Physical Exam No disterss, well appearing Heent: per, conj clear, atraumatic Neck supple Cv: rrr no mrg Lungs clear; normal respiratory effort abd s/nt Ext no edema Msk: right elbow incision intact no dehiscence; sutures intact; there is slight serosanguinous oozing from the previous penrose site; no tenderness/swelling/redness Skin no rash Neuro cn2-12 intact, nonfocal Psych alert/oriented  Lab: 9/20 cr 1.2; cbc 8/14/251  Microbiology: 9/20 wound cx mssa  Serology:  Imaging: 1/14 mri right elbow Reviewed; noted olecronon osseous changes and posterior fluid collection New marrow edema in the olecranon is consistent with osteomyelitis. A 0.6 cm in diameter focus of T2 hyperintensity in a defect in the posterior olecranon  is worrisome for an intraosseous abscess. This is at the site of prior tendon attachment.  Fluid collection posterior to the olecranon worrisome for abscess.  1 cm transverse gap in the triceps tendon at its insertion is compatible with recurrent tear.  No change in lateral epicondylitis.  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  Assessment/plan: Chronic right elbow olecronon OM, complicated by retained foreign material mssa  The sutures have been removed 1/17; patient to confirm with his surgeon  He can continue cephalexin for now until homehealth can start picc and iv cefazolin. Plan 6weeks cefazolin plus oral abx suppression if sutures still present  -- (patient just confirmed all foreign materials removed)   Problem List Items Addressed This Visit   None   Visit Diagnoses    Chronic osteomyelitis ([place])    -  Primary   Relevant Orders   CBC w/Diff   Comprehensive metabolic panel   C-reactive  protein     -await picc -cefazolin planned for 6 weeks 2 gram iv q8hours, pending kidney function testing -weekly cbc, cmp, crp while on iv abx -f/u 4 weeks with me  I spend 60 minutes reviewing data and >50% face to face discussion/providing counseling for patient  Follow-up: Return in about 4 weeks (around 10/18/2020).  Jabier Mutton, [facility] [place] -- -- pager   [phone] cell 09/20/2020, 2:47 PM

## 2020-09-21 ENCOUNTER — Other Ambulatory Visit (HOSPITAL_COMMUNITY): Payer: Self-pay

## 2020-09-21 ENCOUNTER — Other Ambulatory Visit: Payer: Self-pay

## 2020-09-21 ENCOUNTER — Other Ambulatory Visit: Payer: Self-pay | Admitting: Internal Medicine

## 2020-09-21 ENCOUNTER — Encounter (HOSPITAL_COMMUNITY)
Admission: RE | Admit: 2020-09-21 | Discharge: 2020-09-21 | Disposition: A | Discharge status: Home / Self Care | Payer: PPO | Source: Ambulatory Visit | Attending: Internal Medicine | Admitting: Internal Medicine

## 2020-09-21 ENCOUNTER — Ambulatory Visit (HOSPITAL_COMMUNITY)
Admission: RE | Admit: 2020-09-21 | Discharge: 2020-09-21 | Disposition: A | Discharge status: Home / Self Care | Payer: PPO | Source: Ambulatory Visit | Attending: Internal Medicine | Admitting: Internal Medicine

## 2020-09-21 DIAGNOSIS — M8660 Other chronic osteomyelitis, unspecified site: Secondary | ICD-10-CM | POA: Insufficient documentation

## 2020-09-21 DIAGNOSIS — M009 Pyogenic arthritis, unspecified: Secondary | ICD-10-CM

## 2020-09-21 DIAGNOSIS — Z452 Encounter for adjustment and management of vascular access device: Secondary | ICD-10-CM | POA: Diagnosis not present

## 2020-09-21 DIAGNOSIS — A4181 Sepsis due to Enterococcus: Secondary | ICD-10-CM | POA: Diagnosis not present

## 2020-09-21 HISTORY — PX: IR FLUORO GUIDE CV LINE LEFT: IMG2282

## 2020-09-21 LAB — CBC WITH DIFFERENTIAL/PLATELET
Absolute Monocytes: 589 cells/uL (ref 200–950)
Basophils Absolute: 100 cells/uL (ref 0–200)
Basophils Relative: 1.2 %
Eosinophils Absolute: 415 cells/uL (ref 15–500)
Eosinophils Relative: 5 %
HCT: 43.6 % (ref 38.5–50.0)
Hemoglobin: 14.6 g/dL (ref 13.2–17.1)
Lymphs Abs: 2523 cells/uL (ref 850–3900)
MCH: 30.4 pg (ref 27.0–33.0)
MCHC: 33.5 g/dL (ref 32.0–36.0)
MCV: 90.6 fL (ref 80.0–100.0)
MPV: 12.1 fL (ref 7.5–12.5)
Monocytes Relative: 7.1 %
Neutro Abs: 4673 cells/uL (ref 1500–7800)
Neutrophils Relative %: 56.3 %
Platelets: 223 10*3/uL (ref 140–400)
RBC: 4.81 10*6/uL (ref 4.20–5.80)
RDW: 11.8 % (ref 11.0–15.0)
Total Lymphocyte: 30.4 %
WBC: 8.3 10*3/uL (ref 3.8–10.8)

## 2020-09-21 LAB — C-REACTIVE PROTEIN: CRP: 1.5 mg/L (ref <8.0)

## 2020-09-21 IMAGING — US IR FLUORO GUIDE CV LINE*L*
2 series · 3 of 3 positions shown · non-contrast
Comparison: none

INDICATION: Patient with history of chronic right elbow osteomyelitis; central
venous access requested for IV antibiotic therapy.

[Series 1: (id) · 2 of 2 slices shown]
[im 1/2]
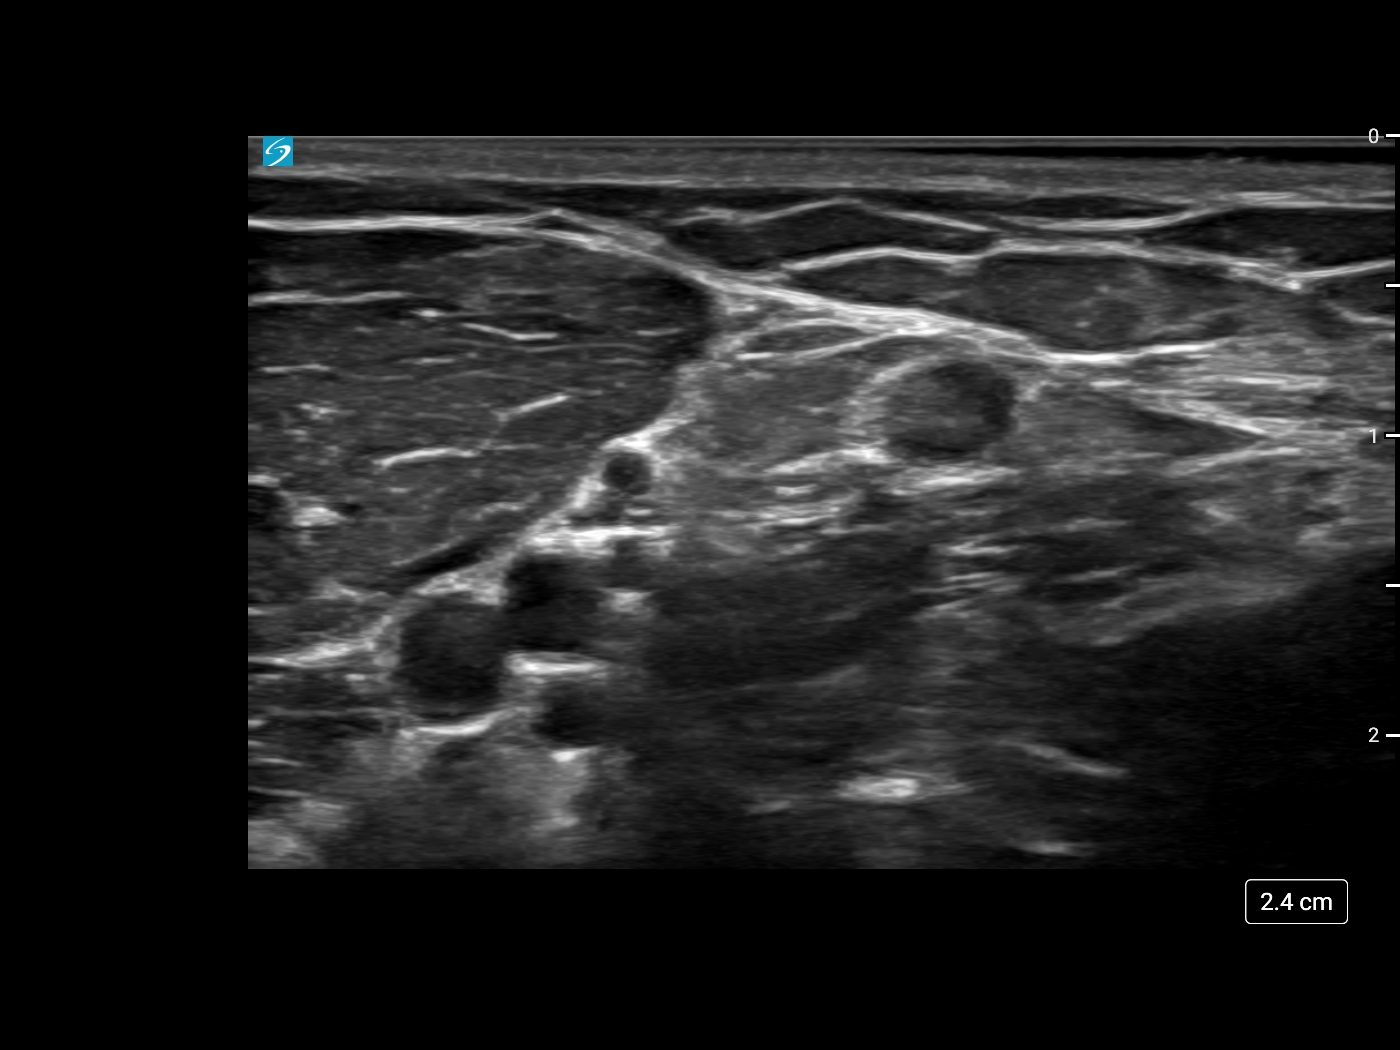
[im 2/2]
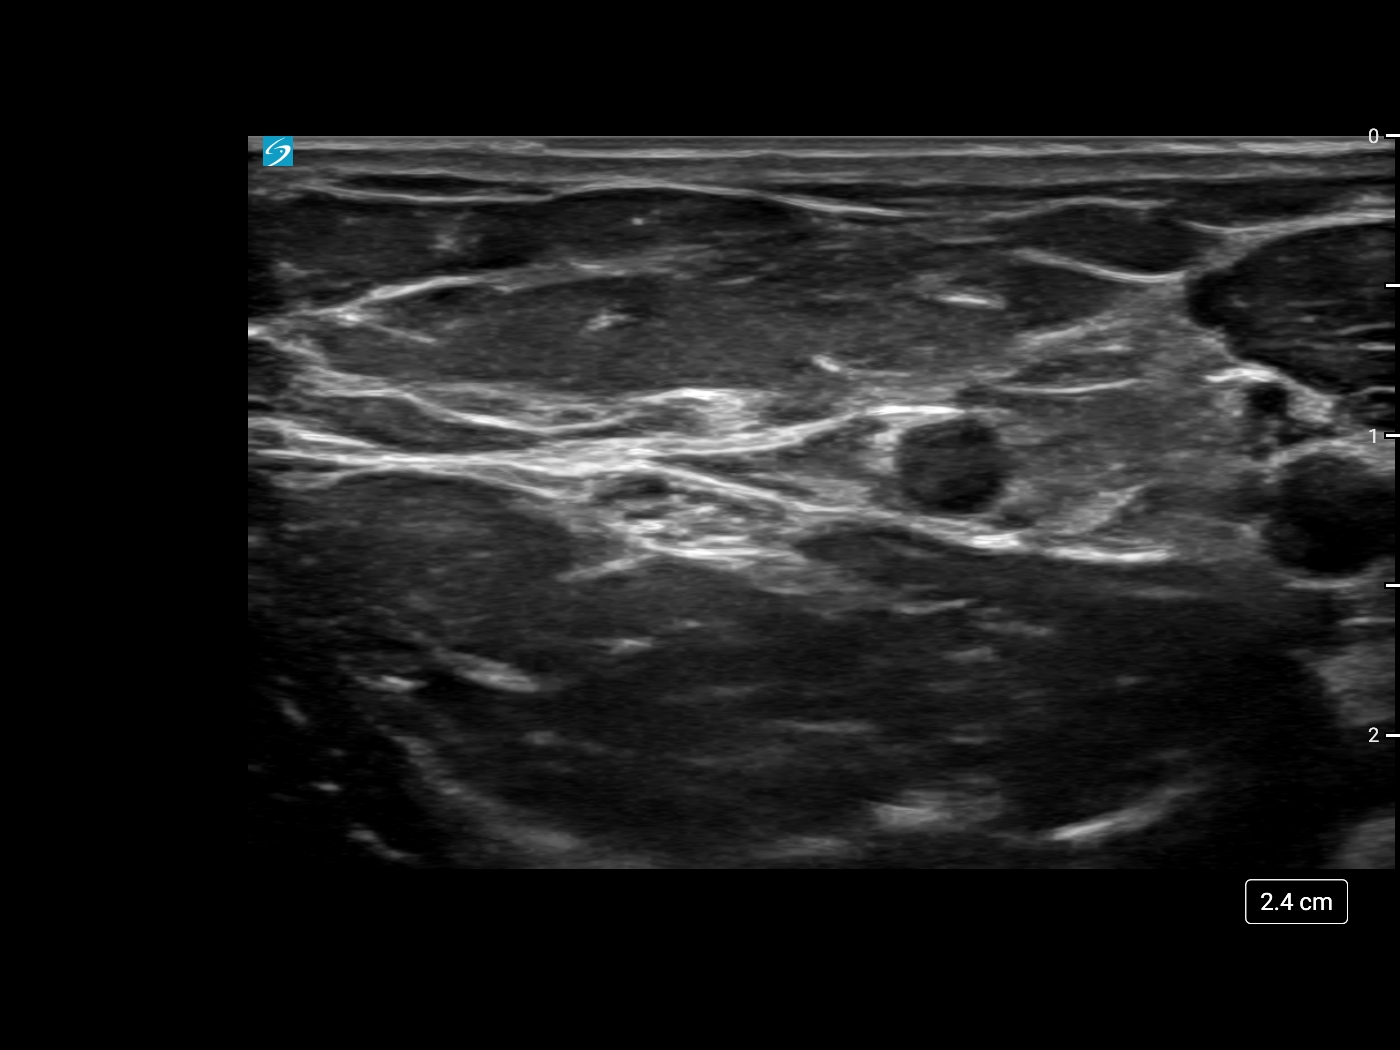

[Series 300: dsa body · 1 of 1 slices shown]
[im 1/1]
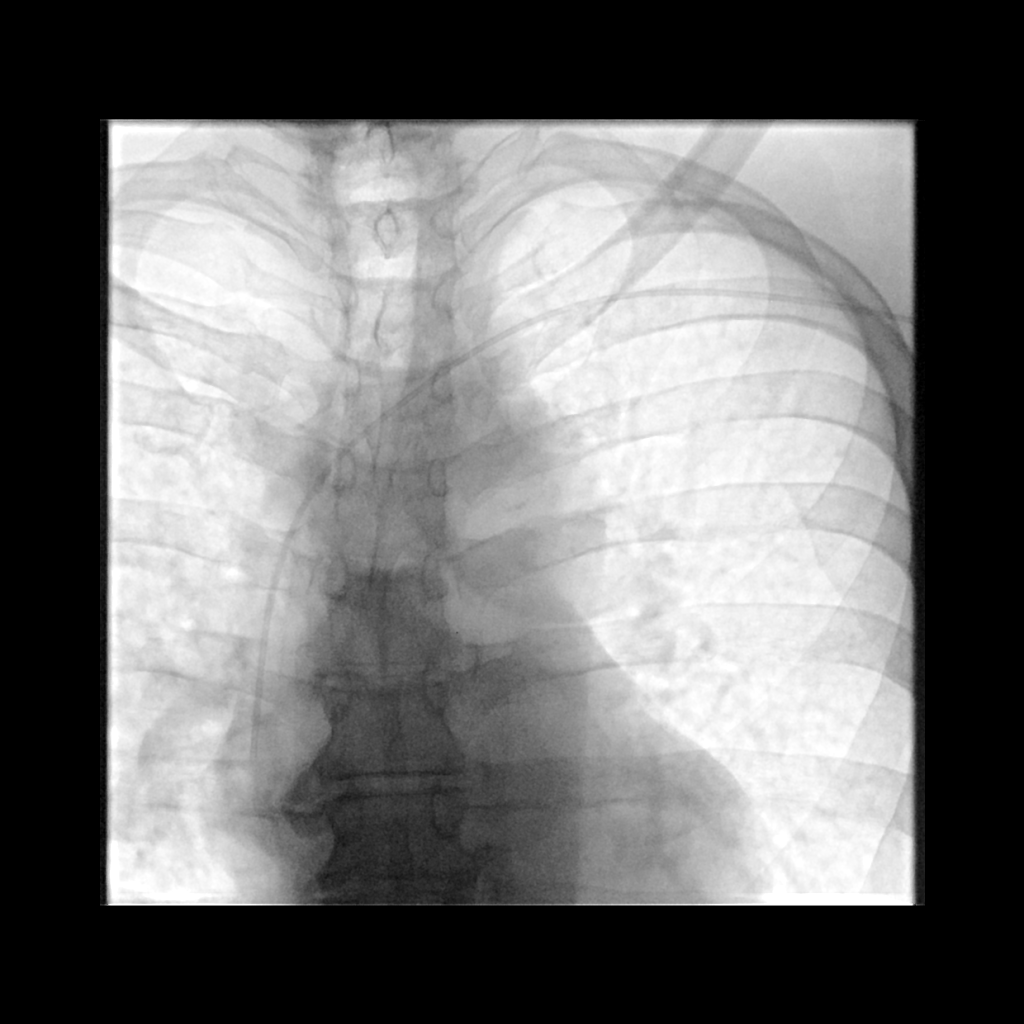

[3 of 3 positions shown; findings below may reference images not displayed]

EXAM:
PICC LINE PLACEMENT WITH ULTRASOUND AND FLUOROSCOPIC GUIDANCE

MEDICATIONS:
1% lidocaine to skin and subcutaneous tissue

ANESTHESIA/SEDATION:
None

:
Fluoroscopy Time: 1 minute (8 mGy).

COMPLICATIONS:
None immediate.

PROCEDURE:
The patient was advised of the possible risks and complications and
agreed to undergo the procedure. The patient was then brought to the
angiographic suite for the procedure.

The left arm was prepped with chlorhexidine, draped in the usual
sterile fashion using maximum barrier technique (cap and mask,
sterile gown, sterile gloves, large sterile sheet, hand hygiene and
cutaneous antisepsis) and infiltrated locally with 1% Lidocaine.

Ultrasound demonstrated patency of the left basilic vein, and this
was documented with an image. Under real-time ultrasound guidance,
this vein was accessed with a 21 gauge micropuncture needle and
image documentation was performed. A [DATE] wire was introduced in to
the vein. Over this, a 5 French single lumen power injectable PICC
was advanced to the lower SVC/right atrial junction. Fluoroscopy
during the procedure and fluoro spot radiograph confirms appropriate
catheter position. The catheter was flushed and covered with a
sterile dressing.

Catheter length: 40 cm
IMPRESSION: Successful left arm power PICC line placement with ultrasound and
fluoroscopic guidance. The catheter is ready for use.

## 2020-09-21 MED ORDER — HEPARIN SOD (PORK) LOCK FLUSH 100 UNIT/ML IV SOLN: 250.0000 [IU] | INTRAVENOUS | Status: DC | PRN

## 2020-09-21 MED ORDER — CEFAZOLIN SODIUM-DEXTROSE 2-4 GM/100ML-% IV SOLN: 2.0000 g | Freq: Once | INTRAVENOUS | Status: DC

## 2020-09-21 MED ORDER — LIDOCAINE HCL 1 % IJ SOLN
INTRAMUSCULAR | Status: AC
  Filled 2020-09-21: qty 20

## 2020-09-21 MED ORDER — HEPARIN SOD (PORK) LOCK FLUSH 100 UNIT/ML IV SOLN
INTRAVENOUS | Status: AC
  Filled 2020-09-21: qty 5

## 2020-09-21 MED ORDER — LIDOCAINE HCL (PF) 1 % IJ SOLN
INTRAMUSCULAR | Status: DC | PRN
  Administered 2020-09-21: 5 mL

## 2020-09-21 NOTE — Progress Notes (Signed)
Patient antibiotics started in his new PICC line.

## 2020-09-21 NOTE — Procedures (Signed)
US/fluorscopic guided placement of left SL basilic vein PICC. Length 40 cm. Tip SVC/RA junction. No immediate complications. Medication used- 1% lidocaine to skin/SQ tissue. EBL < 2 cc. Ok to use.

## 2020-09-25 ENCOUNTER — Other Ambulatory Visit: Payer: Self-pay | Admitting: Internal Medicine

## 2020-09-25 ENCOUNTER — Encounter (HOSPITAL_COMMUNITY): Payer: Self-pay

## 2020-09-25 DIAGNOSIS — M009 Pyogenic arthritis, unspecified: Secondary | ICD-10-CM

## 2020-09-25 DIAGNOSIS — M01X0 Direct infection of unspecified joint in infectious and parasitic diseases classified elsewhere: Secondary | ICD-10-CM | POA: Diagnosis not present

## 2020-09-29 DIAGNOSIS — A4181 Sepsis due to Enterococcus: Secondary | ICD-10-CM | POA: Diagnosis not present

## 2020-10-02 DIAGNOSIS — Z9889 Other specified postprocedural states: Secondary | ICD-10-CM | POA: Diagnosis not present

## 2020-10-02 DIAGNOSIS — M01X0 Direct infection of unspecified joint in infectious and parasitic diseases classified elsewhere: Secondary | ICD-10-CM | POA: Diagnosis not present

## 2020-10-03 DIAGNOSIS — H52223 Regular astigmatism, bilateral: Secondary | ICD-10-CM | POA: Diagnosis not present

## 2020-10-03 DIAGNOSIS — H524 Presbyopia: Secondary | ICD-10-CM | POA: Diagnosis not present

## 2020-10-03 DIAGNOSIS — H35033 Hypertensive retinopathy, bilateral: Secondary | ICD-10-CM | POA: Diagnosis not present

## 2020-10-03 DIAGNOSIS — H2513 Age-related nuclear cataract, bilateral: Secondary | ICD-10-CM | POA: Diagnosis not present

## 2020-10-06 DIAGNOSIS — A4181 Sepsis due to Enterococcus: Secondary | ICD-10-CM | POA: Diagnosis not present

## 2020-10-09 DIAGNOSIS — M01X0 Direct infection of unspecified joint in infectious and parasitic diseases classified elsewhere: Secondary | ICD-10-CM | POA: Diagnosis not present

## 2020-10-10 ENCOUNTER — Telehealth: Payer: PPO | Admitting: Internal Medicine

## 2020-10-10 ENCOUNTER — Ambulatory Visit (INDEPENDENT_AMBULATORY_CARE_PROVIDER_SITE_OTHER): Payer: PPO | Admitting: Internal Medicine

## 2020-10-10 ENCOUNTER — Encounter: Payer: Self-pay | Admitting: Internal Medicine

## 2020-10-10 ENCOUNTER — Other Ambulatory Visit: Payer: Self-pay

## 2020-10-10 VITALS — BP 134/72 | HR 66 | Temp 98.0°F | Ht 63.0 in | Wt 169.0 lb

## 2020-10-10 DIAGNOSIS — M009 Pyogenic arthritis, unspecified: Secondary | ICD-10-CM | POA: Diagnosis not present

## 2020-10-10 NOTE — Assessment & Plan Note (Signed)
Patient doing well for right olecranon OM with foreign material removed.  Inflammatory markers are normalized at the moment and not having any pain.  PLAN: . Continue cefazolin x 6 weeks as planned through 11/02/20 . Follow up prior to end of therapy . Continue OPAT labs

## 2020-10-10 NOTE — Progress Notes (Signed)
[facility] for Infectious Disease  CHIEF COMPLAINT:    Follow up for chronic osteo  SUBJECTIVE:    Eric Rhodes is a 65 y.o. male with PMHx as below who presents to the clinic for chronic osteo.   Patient followed by my partner Dr Gale Journey.  Previously seen on 09/20/20.  Underwent PICC placement on 09/21/20 for chronic right elbow olecranon osteo complicated by retained foreign material and cultures with MSSA.  Sutures removed 09/18/20.  Now on cefazolin with plan for 6 weeks of therapy from PICC placement (end date 11/02/20).  10/09/20 OPAT labs including ESR and CRP were all normal.   Doing well giving himself ancef q8h.  No adverse effects.  Feels well and not having pain.  PICC line working well without issues.   Please see A&P for the details of today's visit and status of the patient's medical problems.   Patient's Medications  New Prescriptions   No medications on file  Previous Medications   ASPIRIN 81 MG EC TABLET    Take 81 mg by mouth at bedtime. Swallow whole.   CELECOXIB (CELEBREX) 200 MG CAPSULE    Take 200 mg by mouth daily as needed (pain).   CETIRIZINE (ZYRTEC) 10 MG TABLET    Take 10 mg by mouth daily as needed (seasonal allergies).   CHOLECALCIFEROL (VITAMIN D) 1000 UNITS TABLET    Take 1,000 Units by mouth daily.   CO-ENZYME Q-10 30 MG CAPSULE    Take 30 mg by mouth daily.   HYDROCODONE-ACETAMINOPHEN (NORCO) 5-325 MG TABLET    Take 1-2 tablets by mouth every 4 (four) hours as needed for moderate pain.   LOSARTAN (COZAAR) 25 MG TABLET    TAKE ONE TABLET BY MOUTH ONCE DAILY   METOPROLOL TARTRATE (LOPRESSOR) 25 MG TABLET    TAKE 1/2 TABLET BY MOUTH TWICE DAILY   MULTIPLE VITAMIN (MULTIVITAMIN WITH MINERALS) TABS TABLET    Take 1 tablet by mouth daily.   OMEGA-3 FATTY ACIDS (FISH OIL PO)    Take 1 capsule by mouth 2 (two) times daily.   REPATHA SURECLICK 453 MG/ML SOAJ    INJECT THE CONTENTS OF 1 SYRINGE SUBCUTANEOUSLY EVERY 14 DAYS   ROSUVASTATIN (CRESTOR)  5 MG TABLET    Take 5 mg by mouth at bedtime.    TRIAMCINOLONE OINTMENT (KENALOG) 0.1 %    Apply 1 application topically 2 (two) times daily as needed (rash).    VITAMIN B-12 (CYANOCOBALAMIN) 100 MCG TABLET    Take 100 mcg by mouth daily.  Modified Medications   No medications on file  Discontinued Medications   No medications on file      Past Medical History:  Diagnosis Date  . BPH (benign prostatic hyperplasia)   . Chronic abdominal pain   . Chronic insomnia   . GERD (gastroesophageal reflux disease)    diet controlled  . H/O ETOH abuse   . Hernia, inguinal, left   . History of colonoscopy 2004  . History of endoscopy 2004  . Hypertension   . Irritable bowel disease   . Prostatitis     Social History   Tobacco Use  . Smoking status: Never Smoker  . Smokeless tobacco: Never Used  Vaping Use  . Vaping Use: Never used  Substance Use Topics  . Alcohol use: No  . Drug use: No    Family History  Problem Relation Age of Onset  . Heart disease Mother  Bradycardia/pacemaker  . Myelodysplastic syndrome Mother        chronic  . Hypertension Mother   . Clotting disorder Mother   . Hypercholesterolemia Mother   . COPD Father        respiratory failure  . Hypertension Father   . Diabetes Brother   . Psoriasis Brother   . Obesity Brother   . Heart attack Brother   . ADD / ADHD Son   . ADD / ADHD Son     Allergies  Allergen Reactions  . Augmentin [Amoxicillin-Pot Clavulanate] Other (See Comments)    Causes GI cramps  . Bacitracin Other (See Comments)    Positive patch test  . Roseanne Kaufman Other (See Comments)    Positive patch test  . Benzyl Cinnamate Other (See Comments)    Positive patch test  . Betadine [Povidone Iodine] Other (See Comments)    Skin sensitive  . Clavulanic Acid Other (See Comments)    GI Cramps  . Gold Sodium Thiosulfate Other (See Comments)    Positive patch test  . Lanolin Other (See Comments)    Positive patch test  . Limonene Other  (See Comments)    Positive patch test  . Linalool Other (See Comments)    Positive patch test  . Methylisothiazolinone Other (See Comments)    Positive patch test    . Other Other (See Comments)    Cinnamic Alcohol-Positive patch test Cinnamic Aldehyde-Positive patch test Compositae Mix-Positive patch test Dimethylaminopropylamine-Positive patch test Disperse Dye Mix-Positive patch test Quaternium-15-Positive patch test Fragrance mix I-Positive patch test Fragrance mix II-Positive patch test  . Tea Tree Oil Other (See Comments)    Positive patch test  . Neosporin [Neomycin-Bacitracin Zn-Polymyx] Rash    Contact dermatitis    Review of Systems  Constitutional: Negative.   Gastrointestinal: Negative.   Musculoskeletal: Negative for joint pain.     OBJECTIVE:    Vitals:   10/10/20 1401  BP: 134/72  Pulse: 66  Temp: 98 F (36.7 C)  Weight: 169 lb (76.7 kg)  Height: _0  (1.6 m)   Body mass index is 29.94 kg/m.  Physical Exam Constitutional:      Appearance: Normal appearance.  HENT:     Head: Normocephalic and atraumatic.  Musculoskeletal:     Comments: Left UE PICC line in place. Right elbow surgical incision clean and dry.  Healing without drainage.   Neurological:     Mental Status: He is alert.      Labs and Microbiology: CBC Latest Ref Rng & Units 09/20/2020 05/22/2020 05/20/2020  WBC 3.8 - 10.8 Thousand/uL 8.3 7.6 9.1  Hemoglobin 13.2 - 17.1 g/dL 14.6 13.9 14.5  Hematocrit 38.5 - 50.0 % 43.6 42.0 44.3  Platelets 140 - 400 Thousand/uL 223 251 263   CMP Latest Ref Rng & Units 05/22/2020 05/20/2020 02/24/2019  Glucose 70 - 99 mg/dL 125(H) 131(H) 103(H)  BUN 8 - 23 mg/dL _1 Creatinine 0.61 - 1.24 mg/dL 1.15 1.34(H) 1.26  Sodium 135 - 145 mmol/L 139 137 141  Potassium 3.5 - 5.1 mmol/L 3.9 4.4 4.8  Chloride 98 - 111 mmol/L 102 98 103  CO2 22 - 32 mmol/L _2 Calcium 8.9 - 10.3 mg/dL 9.1 9.4 9.4  Total Protein 6.5 - 8.1 g/dL - 6.9 6.4   Total Bilirubin 0.3 - 1.2 mg/dL - 1.0 1.5(H)  Alkaline Phos 38 - 126 U/L - 47 49  AST 15 - 41 U/L - 17 22  ALT 0 -  44 U/L - 21 22        ASSESSMENT & PLAN:    Joint infection [facility]) Patient doing well for right olecranon OM with foreign material removed.  Inflammatory markers are normalized at the moment and not having any pain.  PLAN: . Continue cefazolin x 6 weeks as planned through 11/02/20 . Follow up prior to end of therapy . Continue OPAT labs    No orders of the defined types were placed in this encounter.       Rayn[facility] for Infectious Disease [facility] 10/10/2020, 2:26 PM

## 2020-10-10 NOTE — Patient Instructions (Signed)
Thank you for coming to see me today. It was a pleasure seeing you.  To Do: Marland Kitchen Continue ancef . Follow up with Dr Gale Journey in about 3 weeks  If you have any questions or concerns, please do not hesitate to call the office at [phone].  Take Care,   Jule Ser, DO

## 2020-10-13 DIAGNOSIS — R7881 Bacteremia: Secondary | ICD-10-CM | POA: Diagnosis not present

## 2020-10-13 DIAGNOSIS — A4181 Sepsis due to Enterococcus: Secondary | ICD-10-CM | POA: Diagnosis not present

## 2020-10-16 DIAGNOSIS — R6883 Chills (without fever): Secondary | ICD-10-CM | POA: Diagnosis not present

## 2020-10-16 DIAGNOSIS — R197 Diarrhea, unspecified: Secondary | ICD-10-CM | POA: Diagnosis not present

## 2020-10-18 ENCOUNTER — Other Ambulatory Visit: Payer: PPO

## 2020-10-18 ENCOUNTER — Other Ambulatory Visit: Payer: Self-pay

## 2020-10-18 ENCOUNTER — Telehealth: Payer: Self-pay

## 2020-10-18 DIAGNOSIS — R197 Diarrhea, unspecified: Secondary | ICD-10-CM

## 2020-10-18 DIAGNOSIS — M01X0 Direct infection of unspecified joint in infectious and parasitic diseases classified elsewhere: Secondary | ICD-10-CM | POA: Diagnosis not present

## 2020-10-18 NOTE — Telephone Encounter (Signed)
Patient has history IBS per Jeani Hawking with [facility]. Patient is worried due to after antibiotic infusions he is having some stomach cramps and then episodes of loose stools. Verbal order per Dr. Baxter Flattery to test stool for C-diff.  Patient dropped of stool samples today. Patient advised to continue antibiotic therapy for now as we wait on results and increase fluid intake.  Eugenia Mcalpine

## 2020-10-19 ENCOUNTER — Telehealth: Payer: Self-pay

## 2020-10-19 LAB — C. DIFFICILE GDH AND TOXIN A/B
GDH ANTIGEN: NOT DETECTED
MICRO NUMBER:: 11543376
SPECIMEN QUALITY:: ADEQUATE
TOXIN A AND B: NOT DETECTED

## 2020-10-19 NOTE — Telephone Encounter (Signed)
Received call from [place] at [facility] at 10:34 am to relay critical lab results of WBC 1.1, absolute neutrophils 0.4, and platelets 83. RN provided Janene Madeira, NP with printed copy of results at 10:39 am.   RN relayed verbal orders per Janene Madeira, NP to [facility] to stop the Ancef, start daptomycin 625mg  IV q24 hours, repeat CBC with diff Monday 10/23/20, and to add a weekly CK to patient's lab draws. Orders repeated and verified. [facility] will be able to complete first dose of daptomycin in patient's home.   RN spoke with patient to notify him that he should stop taking the Ancef per Janene Madeira, NP. Patient denies any signs of bleeding or bruising. States he has been experiencing diarrhea, but does not see any blood. He is waiting on C.diff results. RN advised patient to notify us if he develops unusual bleeding or bruising. Patient verbalized understanding of all instructions and has no further questions.   Beryle Flock, RN

## 2020-10-20 ENCOUNTER — Telehealth: Payer: Self-pay

## 2020-10-20 DIAGNOSIS — A4181 Sepsis due to Enterococcus: Secondary | ICD-10-CM | POA: Diagnosis not present

## 2020-10-20 DIAGNOSIS — R7881 Bacteremia: Secondary | ICD-10-CM | POA: Diagnosis not present

## 2020-10-20 NOTE — Telephone Encounter (Signed)
Received call from patient, states he received an anaphylaxis kit with his first dose of daptomycin. He is wanting to confirm that first dose protocol is being followed. RN informed patient per [facility] that a nurse would be out at 2pm today to monitor him during the first infusion and an hour after, following protocol. Patient has [facility] who is able to do first dose in home. Patient verbalized understanding and has no further questions.   Beryle Flock, RN

## 2020-10-23 ENCOUNTER — Telehealth: Payer: Self-pay

## 2020-10-23 NOTE — Telephone Encounter (Signed)
-----   Message from Rosiland Oz, MD sent at 10/23/2020 12:56 PM EST ----- Regarding: RE: Merryl Hacker ----- Message ----- From: Allegra Grana, CMA Sent: 10/23/2020   9:47 AM EST To: Rosiland Oz, MD Subject: RE: Fu                                         I called patient to schedule him for an appointment this week with a provider and patient did not want to come in this week. Patient states he if feeling better and  has restarted his pantoprazole which he feels has helped.  He reports his diarrhea is better and he is still taking his antibiotics .  ----- Message ----- From: Rosiland Oz, MD Sent: 10/23/2020   9:24 AM EST To: Allegra Grana, CMA Subject: RE: Fu                                         Please schedule with someone this week.  ----- Message ----- From: Allegra Grana, CMA Sent: 10/23/2020   8:29 AM EST To: Rosiland Oz, MD Subject: RE: Melrose Nakayama,  This patient is scheduled for 3/2/222 with Dr. Gale Journey. Is this appointment soon enough? ----- Message ----- From: Rosiland Oz, MD Sent: 10/21/2020   2:19 PM EST To: [facility] Triage Nurse Pool Subject: Fu                                             Please schedule an appt for this patient this coming week preferably with Dr Juleen China. If not, Dr Vu/ Dr Baxter Flattery. Has ongoing  diarrhea with current antibiotics

## 2020-10-24 DIAGNOSIS — M01X0 Direct infection of unspecified joint in infectious and parasitic diseases classified elsewhere: Secondary | ICD-10-CM | POA: Diagnosis not present

## 2020-10-27 DIAGNOSIS — A4181 Sepsis due to Enterococcus: Secondary | ICD-10-CM | POA: Diagnosis not present

## 2020-10-27 DIAGNOSIS — R7881 Bacteremia: Secondary | ICD-10-CM | POA: Diagnosis not present

## 2020-10-30 DIAGNOSIS — Z9889 Other specified postprocedural states: Secondary | ICD-10-CM | POA: Diagnosis not present

## 2020-10-31 ENCOUNTER — Telehealth: Payer: Self-pay

## 2020-10-31 DIAGNOSIS — M01X0 Direct infection of unspecified joint in infectious and parasitic diseases classified elsewhere: Secondary | ICD-10-CM | POA: Diagnosis not present

## 2020-10-31 NOTE — Telephone Encounter (Signed)
Patient called office today to discuss tomorrow's appointment. Would like to know if picc line will be removed at visit or if home health will need to remove picc. Informed patient that MD will discuss picc line at appointment. End date for IV antibiotics is currently scheduled for 3/3.  Patient understands and has no additional questions.

## 2020-11-01 ENCOUNTER — Other Ambulatory Visit: Payer: Self-pay

## 2020-11-01 ENCOUNTER — Encounter: Payer: Self-pay | Admitting: Internal Medicine

## 2020-11-01 ENCOUNTER — Ambulatory Visit (INDEPENDENT_AMBULATORY_CARE_PROVIDER_SITE_OTHER): Payer: PPO | Admitting: Internal Medicine

## 2020-11-01 VITALS — BP 132/72 | HR 65 | Temp 98.0°F | Ht 63.0 in | Wt 168.5 lb

## 2020-11-01 DIAGNOSIS — M869 Osteomyelitis, unspecified: Secondary | ICD-10-CM | POA: Diagnosis not present

## 2020-11-01 NOTE — Patient Instructions (Signed)
Risk of relapse is highest within the first several weeks. Monitor for sign of infection and f/u soon if needed  Otherwise f/u 8 weeks later after repeat labs off abx

## 2020-11-01 NOTE — Progress Notes (Signed)
[facility]  Reason for Consult:chronic osteomyelitis  Referring Provider: Tania Ade    Patient Active Problem List   Diagnosis Date Noted  . Joint infection ([place]) 05/20/2020  . Chronic insomnia 05/01/2018  . Anxiety 05/01/2018  . Nonobstructive atherosclerosis of coronary artery 05/01/2018  . Intermittent palpitations 05/01/2018  . GERD (gastroesophageal reflux disease) 05/01/2018  . Chest pain 05/01/2018  . Hyperlipidemia 12/18/2017  . Coronary artery calcification 12/18/2017  . Precordial chest pain 06/18/2017  . Inguinal hernia, left 09/27/2011      HPI: Eric Rhodes is a 65 y.o. male MDS, obese, psoriasis, dm2, CAD, s/p right elbow tricep tendon repair complicated by recurrent infection, referred here by ortho for comanagement of same   11/01/20 id clinic f/u Patient will finish 6 weeks iv abx 11/03/31 3/01 labs crp/esr, wbc/hg/platelet normal Patient feels well; no n/v/diarrhea  ------------------ He underwent I&D again on 1/17; I reviewed op note "I&D of skin, sq tissue, deep fascia, and bone, with suture removal." unfortunately no cultures were sent "There appeared to be 2 main foci of infection corresponding to the areas of sinus tracts on the skin.  This related to the distal aspect of the suture repair on the posterior proximal ulna and the sutures more proximally.  There was a bone tunnel where the sutures were passed which had been widened out to approximately 4 mm diameter.  There was just a thin bridge posteriorly which was excised.  All suture was removed."  I also reviewed 9/20 op note of the first I&D. No suture removed at that time. Surgical culture grew mssa pan sensitive. The wound initially healed but then dehisced, which then took 4-5 weeks to close up. The area appear ok for 6 weeks but then flared up again.   He appears to have the tricep repair 6 weeks prior to 05/2019. After that has had  pain/redness/swelling/discharge. These don't improve with oral abx prompting the I&D   He understood that all the nondissolveable sutures were removed on 1/17 procedure. I am asking him to ascertain that today with his surgeon  He initially fell on his right elbow prompting the repair  Since surgery, he is doing well. No f/c (had some during 05/2020). He is taking cephalexin 551m 4 times a day   His abx course: Before 05/22/20 cephalexin for 1-2 days, then surgery/postop vanc 4 days, then 7 days levofloxacin Another course of levo then cephalexin then doxycycline. The doxy was 2 weeks until 09/18/2020  No diarrhea/rash. There is minimal pain in the wound. The drain site previously is draining serosanguinous fluid.  Review of Systems: ROS Other ros negative   abx side effect previously. Bactrim -- didn't cause any rash, but after he used it he developed contact dermatitis to many material amox-clav -- gi cramping     Past Medical History:  Diagnosis Date  . BPH (benign prostatic hyperplasia)   . Chronic abdominal pain   . Chronic insomnia   . GERD (gastroesophageal reflux disease)    diet controlled  . H/O ETOH abuse   . Hernia, inguinal, left   . History of colonoscopy 2004  . History of endoscopy 2004  . Hypertension   . Irritable bowel disease   . Prostatitis     Social History   Tobacco Use  . Smoking status: Never Smoker  . Smokeless tobacco: Never Used  Vaping Use  . Vaping Use: Never used  Substance Use Topics  .  Alcohol use: No  . Drug use: No    Family History  Problem Relation Age of Onset  . Heart disease Mother        Energy manager  . Myelodysplastic syndrome Mother        chronic  . Hypertension Mother   . Clotting disorder Mother   . Hypercholesterolemia Mother   . COPD Father        respiratory failure  . Hypertension Father   . Diabetes Brother   . Psoriasis Brother   . Obesity Brother   . Heart attack Brother   . ADD / ADHD  Son   . ADD / ADHD Son     Allergies  Allergen Reactions  . Augmentin [Amoxicillin-Pot Clavulanate] Other (See Comments)    Causes GI cramps  . Bacitracin Other (See Comments)    Positive patch test  . Roseanne Kaufman Other (See Comments)    Positive patch test  . Benzyl Cinnamate Other (See Comments)    Positive patch test  . Betadine [Povidone Iodine] Other (See Comments)    Skin sensitive  . Clavulanic Acid Other (See Comments)    GI Cramps  . Gold Sodium Thiosulfate Other (See Comments)    Positive patch test  . Lanolin Other (See Comments)    Positive patch test  . Limonene Other (See Comments)    Positive patch test  . Linalool Other (See Comments)    Positive patch test  . Methylisothiazolinone Other (See Comments)    Positive patch test    . Other Other (See Comments)    Cinnamic Alcohol-Positive patch test Cinnamic Aldehyde-Positive patch test Compositae Mix-Positive patch test Dimethylaminopropylamine-Positive patch test Disperse Dye Mix-Positive patch test Quaternium-15-Positive patch test Fragrance mix I-Positive patch test Fragrance mix II-Positive patch test  . Tea Tree Oil Other (See Comments)    Positive patch test  . Neosporin [Neomycin-Bacitracin Zn-Polymyx] Rash    Contact dermatitis    OBJECTIVE: Vitals:   11/01/20 1432  BP: 132/72  Pulse: 65  Temp: 98 F (36.7 C)  Weight: 168 lb 8 oz (76.4 kg)  Height: 5' 3"  (1.6 m)   Body mass index is 29.85 kg/m.   Physical Exam No disterss, well appearing Heent: per, conj clear, atraumatic Neck supple Cv: rrr no mrg Lungs clear; normal respiratory effort abd s/nt Ext no edema Msk: right elbow incision healed Skin no rash Neuro cn2-12 intact, nonfocal Psych alert/oriented  Lab: 3/01 cbc normal; cr 0.9  Crp: 10/31/20 crp normal 10/09/20 crp normal  Microbiology: 9/20 wound cx mssa  Serology:  Imaging: 1/14 mri right elbow Reviewed; noted olecronon osseous changes and posterior fluid  collection New marrow edema in the olecranon is consistent with osteomyelitis. A 0.6 cm in diameter focus of T2 hyperintensity in a defect in the posterior olecranon is worrisome for an intraosseous abscess. This is at the site of prior tendon attachment.  Fluid collection posterior to the olecranon worrisome for abscess.  1 cm transverse gap in the triceps tendon at its insertion is compatible with recurrent tear.  No change in lateral epicondylitis.  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  Assessment/plan: Chronic right elbow olecronon OM, complicated by retained foreign material mssa  The sutures have been removed 1/17; patient to confirm with his surgeon  He can continue cephalexin for now until homehealth can start picc and iv cefazolin. Plan 6weeks cefazolin plus oral abx suppression if sutures still present  -- (patient just confirmed all foreign materials removed)  11/01/20 assessment Clinically patient's infection appeared treated. Discussed relapse risk is small but always present most likely within the first several weeks and to monitor daptomycin was used the last 2 weeks due to thrombocytopenia, which had resolved  -crp, cmp, cbc labs in 6-8 weeks -cefazolin planned for 6 weeks 2 gram iv q8hours, pending kidney function testing -f/u after 6-8 weeks labs    I spend 25 minutes reviewing data and >50% face to face discussion/providing counseling for patient  Follow-up: Return in about 8 weeks (around 12/27/2020).  Jabier Mutton, [facility] [place] -- -- pager   [phone] cell 11/01/2020, 2:54 PM

## 2020-11-01 NOTE — Progress Notes (Signed)
  Per verbal order from Dr Gale Journey, 40 cm Single Lumen Peripherally Inserted Central Catheter removed from left basilic, tip intact. No sutures present. RN confirmed length per chart. Dressing was clean and dry. Petroleum dressing applied. Pt advised no heavy lifting with this arm, leave dressing for 24 hours and call the office or seek emergent care if dressing becomes soaked with blood or swelling or sharp pain presents. Patient verbalized understanding and agreement.  Patient's questions answered to their satisfaction. Patient tolerated procedure well. Pharmacy notified  Carlean Purl, RN .

## 2020-11-01 NOTE — Telephone Encounter (Signed)
Received call today from [facility] with [facility] requesting pull picc orders. Informed Eric Rhodes that patient is being seen today and provider's nurse will update them on orders after he is seen.

## 2020-11-07 ENCOUNTER — Telehealth: Payer: Self-pay | Admitting: Cardiology

## 2020-11-07 NOTE — Telephone Encounter (Signed)
Patient would like to know if Dr. Johney Frame specializes in reading FR scans.

## 2020-11-07 NOTE — Telephone Encounter (Signed)
Pt was calling to schedule his yearly office visit with Dr. Johney Frame to establish, for he was a former Dr. Meda Coffee pt.  Pt also inquiring who I will be working with when Dr. Meda Coffee leaves.  Scheduled the pt for his recall due date with Dr. Meda Coffee, but with Dr. Johney Frame, for one year follow-up, on 02/01/21 at 2:40 pm.  Pt aware to arrive 15 mins prior to this office visit.  Informed the pt that I will be working with Dr. Johney Frame starting in April, when Dr. Tollie Eth.  Pt verbalized understanding and agrees with this plan. Pt was more than gracious for all the assistance provided.

## 2020-11-08 ENCOUNTER — Other Ambulatory Visit: Payer: Self-pay | Admitting: Internal Medicine

## 2020-11-08 NOTE — Progress Notes (Signed)
Outside labs reviewed  [facility]   3/01 Cbc 6/14/251; cr 0.98 cpk 82  2/16  Cbc 09/14/81 on cefazolin --> stopped and abx changed to daptomycin 8 mg/kg  2/07 Cbc 6/14/221; cr 0.9  Crp: 3/01     <1 (<10) 2/16     20 2/07     <1 1/24       2

## 2020-11-21 DIAGNOSIS — K219 Gastro-esophageal reflux disease without esophagitis: Secondary | ICD-10-CM | POA: Diagnosis not present

## 2020-11-21 DIAGNOSIS — I2510 Atherosclerotic heart disease of native coronary artery without angina pectoris: Secondary | ICD-10-CM | POA: Diagnosis not present

## 2020-11-21 DIAGNOSIS — I10 Essential (primary) hypertension: Secondary | ICD-10-CM | POA: Diagnosis not present

## 2020-11-21 DIAGNOSIS — E7800 Pure hypercholesterolemia, unspecified: Secondary | ICD-10-CM | POA: Diagnosis not present

## 2020-11-30 ENCOUNTER — Other Ambulatory Visit: Payer: Self-pay | Admitting: Cardiology

## 2020-11-30 DIAGNOSIS — E785 Hyperlipidemia, unspecified: Secondary | ICD-10-CM

## 2021-01-26 DIAGNOSIS — Z6827 Body mass index (BMI) 27.0-27.9, adult: Secondary | ICD-10-CM | POA: Diagnosis not present

## 2021-01-26 DIAGNOSIS — I2510 Atherosclerotic heart disease of native coronary artery without angina pectoris: Secondary | ICD-10-CM | POA: Diagnosis not present

## 2021-01-26 DIAGNOSIS — E7800 Pure hypercholesterolemia, unspecified: Secondary | ICD-10-CM | POA: Diagnosis not present

## 2021-01-26 DIAGNOSIS — J302 Other seasonal allergic rhinitis: Secondary | ICD-10-CM | POA: Diagnosis not present

## 2021-01-26 DIAGNOSIS — Z0000 Encounter for general adult medical examination without abnormal findings: Secondary | ICD-10-CM | POA: Diagnosis not present

## 2021-01-28 NOTE — Progress Notes (Deleted)
Cardiology Office Note:    Date:  01/28/2021   ID:  Eric Rhodes, DOB 22-Nov-1955, MRN [id number]  PCP:  Eric Lass, MD   [facility] Providers Cardiologist:  Eric Dawley, MD (Inactive) {   Referring MD: Eric Lass, MD    History of Present Illness:    Eric Rhodes is a 65 y.o. male with a hx of moderate nonobstructive coronary artery disease, GERD/peptic ulcer disease, hypertension, hyperlipidemia, and family history of sudden cardiac death who was previously followed by Dr. Meda Rhodes who presents to clinic for CV follow-up.  Per review of the record, he had stress test 06/2017 which was negative for ischemia or scar. Underwent coronary CTA in May 2019 which demonstrated <30% plaque in the left main, <50% calcific plaque in the proximal and mid LAD, mid LAD myocardial bridging, 50-75% calcific plaque in the ostial D1, <30% calcific plaque in the proximal and mid and distal RCA, <30% calcific plaque in the mid PLB.  The calcium score was 285.  FFR of the diagonal was normal. Last saw Dr. Denzil Rhodes on 02/25/20 where he was doing well. Tolerating medications.   Today,  Past Medical History:  Diagnosis Date  . BPH (benign prostatic hyperplasia)   . Chronic abdominal pain   . Chronic insomnia   . GERD (gastroesophageal reflux disease)    diet controlled  . H/O ETOH abuse   . Hernia, inguinal, left   . History of colonoscopy 2004  . History of endoscopy 2004  . Hypertension   . Irritable bowel disease   . Prostatitis     Past Surgical History:  Procedure Laterality Date  . ANTERIOR CRUCIATE LIGAMENT REPAIR Left 1978  . BUNIONECTOMY    . COLON MEDOFF    . COLONOSCOPY    . HEMORRHOIDAL BANDING  10/2014  . HERNIA REPAIR Left 11/2011  . INGUINAL HERNIA REPAIR  11/07/2011   Procedure: HERNIA REPAIR INGUINAL ADULT;  Surgeon: Eric Medal, MD;  Location: [facility];  Service: General;  Laterality: Left;  Left Inguinal Herniorrhaphy with Mesh  .  IRRIGATION AND DEBRIDEMENT ELBOW Right 05/22/2020   Procedure: IRRIGATION AND DEBRIDEMENT RIGHT ELBOW;  Surgeon: Eric Ade, MD;  Location: [facility];  Service: Orthopedics;  Laterality: Right;  . IRRIGATION AND DEBRIDEMENT ELBOW Right 09/18/2020   Procedure: IRRIGATION AND DEBRIDEMENT ELBOW WITH SUTURE REMOVAL;  Surgeon: Eric Ade, MD;  Location: [facility];  Service: Orthopedics;  Laterality: Right;  . UPPER GASTROINTESTINAL ENDOSCOPY    . WISDOM TOOTH EXTRACTION      Current Medications: No outpatient medications have been marked as taking for the 02/01/21 encounter (Appointment) with Eric Bergeron, MD.     Allergies:   Augmentin [amoxicillin-pot clavulanate], Bacitracin, Balsam, Benzyl cinnamate, Betadine [povidone iodine], Clavulanic acid, Gold sodium thiosulfate, Lanolin, Limonene, Linalool, Methylisothiazolinone, Other, Tea tree oil, and Neosporin [neomycin-bacitracin zn-polymyx]   Social History   Socioeconomic History  . Marital status: Married    Spouse name: Not on file  . Number of children: Not on file  . Years of education: Not on file  . Highest education level: Not on file  Occupational History  . Occupation: podiatrist    Comment: retired  Tobacco Use  . Smoking status: Never Smoker  . Smokeless tobacco: Never Used  Vaping Use  . Vaping Use: Never used  Substance and Sexual Activity  . Alcohol use: No  . Drug use: No  . Sexual activity: Not on file  Other Topics Concern  .  Not on file  Social History Narrative   He is a retired Art therapist   Rides bicycle 3 x a week (rides with Eric Rhodes); goes to gym opposite days   Social Determinants of Health   Financial Resource Strain: Not on file  Food Insecurity: Not on file  Transportation Needs: Not on file  Physical Activity: Not on file  Stress: Not on file  Social Connections: Not on file     Family History: The patient's ***family history includes ADD / ADHD in his son  and son; COPD in his father; Clotting disorder in his mother; Diabetes in his brother; Heart attack in his brother; Heart disease in his mother; Hypercholesterolemia in his mother; Hypertension in his father and mother; Myelodysplastic syndrome in his mother; Obesity in his brother; Psoriasis in his brother.  ROS:   Please see the history of present illness.    *** All other systems reviewed and are negative.  EKGs/Labs/Other Studies Reviewed:    The following studies were reviewed today: Coronary CTA 01/14/2018 Calcium Score: Calcium noted in LM, LAD and RCA Coronary Arteries: Right dominant with no anomalies LM: Less than 30% calcific plaque LAD: Less than 50% calcific plaque in proximal and mid vessel There also appears to be and area of myocardial bridging in the mid LAD D1: Large vessel with 50-75% calcific plaque at the ostium D2: Normal Circumflex: Normal OM1: Normal OM2: Normal RCA: Less than 30% calcific plaque in the proximal mid and distal vessel PDA: Normal PLA: Less than 30% calcific plaque in the mid PLB IMPRESSION: 1. Calcium Score 285 which is 40 th percentile for age and sex 2. Normal aortic root 3.3 cm 3. CAD see description above Possible obstructive lesion at the ostium of a large first diagonal Study will be sent for FFR-CT 4. Area of bridging myocardium in the mid LAD FFR FINDINGS: Normal FFR-CT RCA:.98 LAD:.93 D1:.89 Circumflex.96 OM1.94 IMPRESSION: Normal FFR-CT would indicate the calcific lesion of concern in the ostium of the first diagonal not significant  Echo 06/23/2017 EF 58-52, mild diastolic dysfunction, mild aortic valve sclerosis, ascending aorta 3.46 cm  Nuclear stress test 06/18/2017 IMPRESSION: 1. No reversible ischemia or infarction. 2. Normal left ventricular wall motion. 3. Left ventricular ejection fraction 61% 4. Non invasive risk stratification*: Low  EKG:  EKG is *** ordered today.  The ekg ordered today demonstrates  ***  Recent Labs: 05/20/2020: ALT 21 05/22/2020: BUN 18; Creatinine, Ser 1.15; Potassium 3.9; Sodium 139 09/20/2020: Hemoglobin 14.6; Platelets 223  Recent Lipid Panel    Component Value Date/Time   CHOL 91 (L) 03/27/2018 0755   TRIG 43 03/27/2018 0755   HDL 62 03/27/2018 0755   CHOLHDL 1.5 03/27/2018 0755   CHOLHDL 3.1 06/18/2017 0459   VLDL 6 06/18/2017 0459   LDLCALC 20 03/27/2018 0755     Risk Assessment/Calculations:   {Does this patient have ATRIAL FIBRILLATION?[phone]}   Physical Exam:    VS:  There were no vitals taken for this visit.    Wt Readings from Last 3 Encounters:  11/01/20 168 lb 8 oz (76.4 kg)  10/10/20 169 lb (76.7 kg)  09/20/20 170 lb (77.1 kg)     GEN: *** Well nourished, well developed in no acute distress HEENT: Normal NECK: No JVD; No carotid bruits LYMPHATICS: No lymphadenopathy CARDIAC: ***RRR, no murmurs, rubs, gallops RESPIRATORY:  Clear to auscultation without rales, wheezing or rhonchi  ABDOMEN: Soft, non-tender, non-distended MUSCULOSKELETAL:  No edema; No deformity  SKIN: Warm and  dry NEUROLOGIC:  Alert and oriented x 3 PSYCHIATRIC:  Normal affect   ASSESSMENT:    No diagnosis found. PLAN:    In order of problems listed above:  #Nonobstructive CAD: Moderate on CTA in May 2019. Ostial D1 with moderate lesion but FFR negative. Currently no anginal symptoms. -Continue ASA 81mg  daily -Continue repatha 140mg  q2weeks -Continue crestor 5mg  daily  #Palpitations: Resolved. Normal 30d event monitor. -Continue metop 12.5mg  BID  #HLD: -Continue repatha 140mg  q2weeks -Continue crestor 5mg  daily  #HTN: Controlled. -Continue losartan 25mg  daily -Continue metop 12.5mg  BID  {Are you ordering a CV Procedure (e.g. stress test, cath, DCCV, TEE, etc)?   Press F2        :[id number]}    Medication Adjustments/Labs and Tests Ordered: Current medicines are reviewed at length with the patient today.  Concerns regarding medicines are  outlined above.  No orders of the defined types were placed in this encounter.  No orders of the defined types were placed in this encounter.   There are no Patient Instructions on file for this visit.   Signed, Eric Bergeron, MD  01/28/2021 3:15 PM    [place]

## 2021-02-01 ENCOUNTER — Encounter: Payer: Self-pay | Admitting: *Deleted

## 2021-02-01 ENCOUNTER — Encounter: Payer: Self-pay | Admitting: Cardiology

## 2021-02-01 ENCOUNTER — Other Ambulatory Visit: Payer: Self-pay

## 2021-02-01 ENCOUNTER — Ambulatory Visit: Payer: PPO | Admitting: Cardiology

## 2021-02-01 VITALS — BP 112/70 | HR 86 | Ht 63.0 in | Wt 159.0 lb

## 2021-02-01 DIAGNOSIS — R079 Chest pain, unspecified: Secondary | ICD-10-CM

## 2021-02-01 DIAGNOSIS — E785 Hyperlipidemia, unspecified: Secondary | ICD-10-CM | POA: Diagnosis not present

## 2021-02-01 DIAGNOSIS — I2510 Atherosclerotic heart disease of native coronary artery without angina pectoris: Secondary | ICD-10-CM

## 2021-02-01 DIAGNOSIS — R002 Palpitations: Secondary | ICD-10-CM

## 2021-02-01 DIAGNOSIS — I10 Essential (primary) hypertension: Secondary | ICD-10-CM | POA: Diagnosis not present

## 2021-02-01 MED ORDER — AMLODIPINE BESYLATE-VALSARTAN 5-160 MG PO TABS: 0.5000 | ORAL_TABLET | Freq: Every day | ORAL | 3 refills | Status: DC

## 2021-02-01 NOTE — Patient Instructions (Addendum)
Medication Instructions:  Continue current medications  *If you need a refill on your cardiac medications before your next appointment, please call your pharmacy*   Lab Work: none If you have labs (blood work) drawn today and your tests are completely normal, you will receive your results only by: Marland Kitchen MyChart Message (if you have MyChart) OR . A paper copy in the mail If you have any lab test that is abnormal or we need to change your treatment, we will call you to review the results.   Testing/Procedures: none   Follow-Up: At [facility], you and your health needs are our priority.  As part of our continuing mission to provide you with exceptional heart care, we have created designated Provider Care Teams.  These Care Teams include your primary Cardiologist (physician) and Advanced Practice Providers (APPs -  Physician Assistants and Nurse Practitioners) who all work together to provide you with the care you need, when you need it.  We recommend signing up for the patient portal called "MyChart".  Sign up information is provided on this After Visit Summary.  MyChart is used to connect with patients for Virtual Visits (Telemedicine).  Patients are able to view lab/test results, encounter notes, upcoming appointments, etc.  Non-urgent messages can be sent to your provider as well.   To learn more about what you can do with MyChart, go to [email or web address].    Your next appointment:   1 year(s)  The format for your next appointment:   In Person  Provider:   Gwyndolyn Kaufman, MD   Other Instructions none

## 2021-02-01 NOTE — Progress Notes (Signed)
Cardiology Office Note:    Date:  02/01/2021   ID:  Eric Rhodes, DOB [birth year], MRN [id number]  PCP:  Kathyrn Lass, MD   [facility] Providers Cardiologist:  Ena Dawley, MD (Inactive) {   Referring MD: Kathyrn Lass, MD    History of Present Illness:    Eric Rhodes is a 65 y.o. male with a hx of moderate nonobstructive coronary artery disease, GERD/peptic ulcer disease, hypertension, hyperlipidemia, and family history of sudden cardiac death who was previously followed by Dr. Meda Coffee who presents to clinic for CV follow-up.  Per review of the record, he had stress test 06/2017 which was negative for ischemia or scar. Underwent coronary CTA in May 2019 which demonstrated <30% plaque in the left main, <50% calcific plaque in the proximal and mid LAD, mid LAD myocardial bridging, 50-75% calcific plaque in the ostial D1, <30% calcific plaque in the proximal and mid and distal RCA, <30% calcific plaque in the mid PLB.  The calcium score was 285.  FFR of the diagonal was normal. Last saw Dr. Denzil Magnuson on 02/25/20 where he was doing well. Tolerating medications.   Today, the patient is feeling very well. LDL well controlled at PCP office at 25. He remains very active and he rides his bike for 3 hours for 3 days a week. Other days of the week he works out in [employer]. No exertional symptoms.   He denies any chest pain, shortness of breath, palpitations, or exertional symptoms. No headaches, lightheadedness, or syncope to report. Also has no lower extremity edema, orthopnea or PND.   Past Medical History:  Diagnosis Date  . BPH (benign prostatic hyperplasia)   . Chronic abdominal pain   . Chronic insomnia   . GERD (gastroesophageal reflux disease)    diet controlled  . H/O ETOH abuse   . Hernia, inguinal, left   . History of colonoscopy 2004  . History of endoscopy 2004  . Hypertension   . Irritable bowel disease   . Prostatitis     Past Surgical History:  Procedure  Laterality Date  . ANTERIOR CRUCIATE LIGAMENT REPAIR Left 1978  . BUNIONECTOMY    . COLON MEDOFF    . COLONOSCOPY    . HEMORRHOIDAL BANDING  10/2014  . HERNIA REPAIR Left 11/2011  . INGUINAL HERNIA REPAIR  11/07/2011   Procedure: HERNIA REPAIR INGUINAL ADULT;  Surgeon: Shann Medal, MD;  Location: [place];  Service: General;  Laterality: Left;  Left Inguinal Herniorrhaphy with Mesh  . IRRIGATION AND DEBRIDEMENT ELBOW Right 05/22/2020   Procedure: IRRIGATION AND DEBRIDEMENT RIGHT ELBOW;  Surgeon: Tania Ade, MD;  Location: [place];  Service: Orthopedics;  Laterality: Right;  . IRRIGATION AND DEBRIDEMENT ELBOW Right 09/18/2020   Procedure: IRRIGATION AND DEBRIDEMENT ELBOW WITH SUTURE REMOVAL;  Surgeon: Tania Ade, MD;  Location: [place];  Service: Orthopedics;  Laterality: Right;  . UPPER GASTROINTESTINAL ENDOSCOPY    . WISDOM TOOTH EXTRACTION      Current Medications: Current Meds  Medication Sig  . aspirin 81 MG EC tablet Take 81 mg by mouth at bedtime. Swallow whole.  . celecoxib (CELEBREX) 200 MG capsule Take 200 mg by mouth daily as needed (pain).  . cetirizine (ZYRTEC) 10 MG tablet Take 10 mg by mouth daily as needed (seasonal allergies).  . cholecalciferol (VITAMIN D) 1000 units tablet Take 1,000 Units by mouth daily.  Marland Kitchen co-enzyme Q-10 30 MG capsule Take 30 mg by mouth daily.  Marland Kitchen  losartan (COZAAR) 25 MG tablet TAKE ONE TABLET BY MOUTH ONCE DAILY  . metoprolol tartrate (LOPRESSOR) 25 MG tablet TAKE 1/2 TABLET BY MOUTH TWICE DAILY  . Multiple Vitamin (MULTIVITAMIN WITH MINERALS) TABS tablet Take 1 tablet by mouth daily.  . Omega-3 Fatty Acids (FISH OIL PO) Take 1 capsule by mouth 2 (two) times daily.  Marland Kitchen REPATHA SURECLICK 829 MG/ML SOAJ INJECT THE CONTENTS OF 1 SYRINGE INTO THE SKIN ONCE EVERY 14 DAYS  . rosuvastatin (CRESTOR) 5 MG tablet Take 5 mg by mouth at bedtime.   . triamcinolone ointment (KENALOG) 0.1 % Apply 1 application topically  2 (two) times daily as needed (rash).   . vitamin B-12 (CYANOCOBALAMIN) 100 MCG tablet Take 100 mcg by mouth daily.  . [DISCONTINUED] amLODipine-valsartan (EXFORGE) 5-160 MG tablet Take 0.5 tablets by mouth daily.     Allergies:   Augmentin [amoxicillin-pot clavulanate], Bacitracin, Balsam, Benzyl cinnamate, Betadine [povidone iodine], Clavulanic acid, Gold sodium thiosulfate, Lanolin, Limonene, Linalool, Methylisothiazolinone, Other, Tea tree oil, and Neosporin [neomycin-bacitracin zn-polymyx]   Social History   Socioeconomic History  . Marital status: Married    Spouse name: Not on file  . Number of children: Not on file  . Years of education: Not on file  . Highest education level: Not on file  Occupational History  . Occupation: podiatrist    Comment: retired  Tobacco Use  . Smoking status: Never Smoker  . Smokeless tobacco: Never Used  Vaping Use  . Vaping Use: Never used  Substance and Sexual Activity  . Alcohol use: No  . Drug use: No  . Sexual activity: Not on file  Other Topics Concern  . Not on file  Social History Narrative   He is a retired Art therapist   Rides bicycle 3 x a week (rides with Dr. Eustace Quail); goes to gym opposite days   Social Determinants of Health   Financial Resource Strain: Not on file  Food Insecurity: Not on file  Transportation Needs: Not on file  Physical Activity: Not on file  Stress: Not on file  Social Connections: Not on file     Family History: The patient's family history includes ADD / ADHD in his son and son; COPD in his father; Clotting disorder in his mother; Diabetes in his brother; Heart attack in his brother; Heart disease in his mother; Hypercholesterolemia in his mother; Hypertension in his father and mother; Myelodysplastic syndrome in his mother; Obesity in his brother; Psoriasis in his brother.  ROS:   Review of Systems  Constitutional: Negative for malaise/fatigue and weight loss.  HENT: Negative for ear  discharge, hearing loss and sore throat.   Eyes: Negative for double vision and discharge.  Respiratory: Negative for shortness of breath and wheezing.   Cardiovascular: Negative for chest pain, palpitations, orthopnea, claudication, leg swelling and PND.  Gastrointestinal: Negative for blood in stool and vomiting.  Genitourinary: Negative for dysuria and frequency.  Musculoskeletal: Negative for myalgias and neck pain.  Neurological: Negative for tremors, seizures and weakness.  Endo/Heme/Allergies: Negative for polydipsia.  Psychiatric/Behavioral: Negative for substance abuse and suicidal ideas. The patient is not nervous/anxious.      EKGs/Labs/Other Studies Reviewed:    The following studies were reviewed today: Coronary CTA 01/14/2018 Calcium Score: Calcium noted in LM, LAD and RCA Coronary Arteries: Right dominant with no anomalies LM: Less than 30% calcific plaque LAD: Less than 50% calcific plaque in proximal and mid vessel There also appears to be and area of myocardial bridging in the  mid LAD D1: Large vessel with 50-75% calcific plaque at the ostium D2: Normal Circumflex: Normal OM1: Normal OM2: Normal RCA: Less than 30% calcific plaque in the proximal mid and distal vessel PDA: Normal PLA: Less than 30% calcific plaque in the mid PLB IMPRESSION: 1. Calcium Score 285 which is 44 th percentile for age and sex 2. Normal aortic root 3.3 cm 3. CAD see description above Possible obstructive lesion at the ostium of a large first diagonal Study will be sent for FFR-CT 4. Area of bridging myocardium in the mid LAD FFR FINDINGS: Normal FFR-CT RCA:.98 LAD:.93 D1:.89 Circumflex.96 OM1.94 IMPRESSION: Normal FFR-CT would indicate the calcific lesion of concern in the ostium of the first diagonal not significant  Echo 06/23/2017 EF 16-10, mild diastolic dysfunction, mild aortic valve sclerosis, ascending aorta 3.46 cm  Nuclear stress test  06/18/2017 IMPRESSION: 1. No reversible ischemia or infarction. 2. Normal left ventricular wall motion. 3. Left ventricular ejection fraction 61% 4. Non invasive risk stratification*: Low  EKG:   02/01/2021: Normal Sinus, Rate 86 bpm.  Recent Labs: 05/20/2020: ALT 21 05/22/2020: BUN 18; Creatinine, Ser 1.15; Potassium 3.9; Sodium 139 09/20/2020: Hemoglobin 14.6; Platelets 223  Recent Lipid Panel    Component Value Date/Time   CHOL 91 (L) 03/27/2018 0755   TRIG 43 03/27/2018 0755   HDL 62 03/27/2018 0755   CHOLHDL 1.5 03/27/2018 0755   CHOLHDL 3.1 06/18/2017 0459   VLDL 6 06/18/2017 0459   LDLCALC 20 03/27/2018 0755     Risk Assessment/Calculations:       Physical Exam:    VS:  BP 112/70   Pulse 86   Ht 5\' 3"  (1.6 m)   Wt 159 lb (72.1 kg)   SpO2 98%   BMI 28.17 kg/m     Wt Readings from Last 3 Encounters:  02/01/21 159 lb (72.1 kg)  11/01/20 168 lb 8 oz (76.4 kg)  10/10/20 169 lb (76.7 kg)     GEN: Well nourished, well developed in no acute distress HEENT: Normal NECK: No JVD; No carotid bruits LYMPHATICS: No lymphadenopathy CARDIAC: RRR, no murmurs, rubs, gallops RESPIRATORY:  Clear to auscultation without rales, wheezing or rhonchi  ABDOMEN: Soft, non-tender, non-distended MUSCULOSKELETAL:  No edema; No deformity  SKIN: Warm and dry NEUROLOGIC:  Alert and oriented x 3 PSYCHIATRIC:  Normal affect   ASSESSMENT:    1. Coronary artery disease involving native coronary artery of native heart without angina pectoris   2. Palpitations   3. Hyperlipidemia, unspecified hyperlipidemia type   4. Essential hypertension   5. Chest pain, unspecified type    PLAN:    In order of problems listed above:  #Nonobstructive CAD: Moderate on CTA in May 2019. Ostial D1 with moderate lesion but FFR negative. Currently no anginal symptoms and patient remains very active and is an avid cyclist. -Continue ASA 81mg  daily -Continue repatha 140mg  q2weeks -Continue crestor  5mg  daily  #Palpitations: Resolved. Normal 30d event monitor. -Continue metop 12.5mg  BID--he would like to continue otherwise his HR races at night  #HLD: Very well controlled with LDL 25 on 12/2020. -Continue repatha 140mg  q2weeks -Continue crestor 5mg  daily  #HTN: Controlled and at goal <120s/80s. -Continue losartan 25mg  daily -Continue metop 12.5mg  BID    Follow-up in 1 year..  Medication Adjustments/Labs and Tests Ordered: Current medicines are reviewed at length with the patient today.  Concerns regarding medicines are outlined above.  Orders Placed This Encounter  Procedures  . EKG 12-Lead   Meds ordered this  encounter  Medications  . DISCONTD: amLODipine-valsartan (EXFORGE) 5-160 MG tablet    Sig: Take 0.5 tablets by mouth daily.    Dispense:  45 tablet    Refill:  3    Patient Instructions  Medication Instructions:  Continue current medications  *If you need a refill on your cardiac medications before your next appointment, please call your pharmacy*   Lab Work: none If you have labs (blood work) drawn today and your tests are completely normal, you will receive your results only by: Marland Kitchen MyChart Message (if you have MyChart) OR . A paper copy in the mail If you have any lab test that is abnormal or we need to change your treatment, we will call you to review the results.   Testing/Procedures: none   Follow-Up: At [facility], you and your health needs are our priority.  As part of our continuing mission to provide you with exceptional heart care, we have created designated Provider Care Teams.  These Care Teams include your primary Cardiologist (physician) and Advanced Practice Providers (APPs -  Physician Assistants and Nurse Practitioners) who all work together to provide you with the care you need, when you need it.  We recommend signing up for the patient portal called "MyChart".  Sign up information is provided on this After Visit Summary.  MyChart  is used to connect with patients for Virtual Visits (Telemedicine).  Patients are able to view lab/test results, encounter notes, upcoming appointments, etc.  Non-urgent messages can be sent to your provider as well.   To learn more about what you can do with MyChart, go to [email or web address].    Your next appointment:   1 year(s)  The format for your next appointment:   In Person  Provider:   Gwyndolyn Kaufman, MD   Other Instructions none   I,Mathew Stumpf,acting as a scribe for Freada Bergeron, MD.,have documented all relevant documentation on the behalf of Freada Bergeron, MD,as directed by  Freada Bergeron, MD while in the presence of Freada Bergeron, MD.  I, Freada Bergeron, MD, have reviewed all documentation for this visit. The documentation on 02/01/21 for the exam, diagnosis, procedures, and orders are all accurate and complete.   Signed, Freada Bergeron, MD  02/01/2021 5:44 PM    [place]

## 2021-02-08 DIAGNOSIS — L821 Other seborrheic keratosis: Secondary | ICD-10-CM | POA: Diagnosis not present

## 2021-02-08 DIAGNOSIS — D171 Benign lipomatous neoplasm of skin and subcutaneous tissue of trunk: Secondary | ICD-10-CM | POA: Diagnosis not present

## 2021-02-08 DIAGNOSIS — D220 Melanocytic nevi of lip: Secondary | ICD-10-CM | POA: Diagnosis not present

## 2021-02-08 DIAGNOSIS — S0036XA Insect bite (nonvenomous) of nose, initial encounter: Secondary | ICD-10-CM | POA: Diagnosis not present

## 2021-02-08 DIAGNOSIS — W57XXXA Bitten or stung by nonvenomous insect and other nonvenomous arthropods, initial encounter: Secondary | ICD-10-CM | POA: Diagnosis not present

## 2021-02-22 DIAGNOSIS — S30860A Insect bite (nonvenomous) of lower back and pelvis, initial encounter: Secondary | ICD-10-CM | POA: Diagnosis not present

## 2021-02-22 DIAGNOSIS — W57XXXA Bitten or stung by nonvenomous insect and other nonvenomous arthropods, initial encounter: Secondary | ICD-10-CM | POA: Diagnosis not present

## 2021-03-16 ENCOUNTER — Other Ambulatory Visit: Payer: Self-pay

## 2021-03-16 MED ORDER — METOPROLOL TARTRATE 25 MG PO TABS: 12.5000 mg | ORAL_TABLET | Freq: Two times a day (BID) | ORAL | 3 refills | Status: DC

## 2021-03-20 DIAGNOSIS — E7800 Pure hypercholesterolemia, unspecified: Secondary | ICD-10-CM | POA: Diagnosis not present

## 2021-03-20 DIAGNOSIS — I2510 Atherosclerotic heart disease of native coronary artery without angina pectoris: Secondary | ICD-10-CM | POA: Diagnosis not present

## 2021-03-20 DIAGNOSIS — K219 Gastro-esophageal reflux disease without esophagitis: Secondary | ICD-10-CM | POA: Diagnosis not present

## 2021-03-20 DIAGNOSIS — I10 Essential (primary) hypertension: Secondary | ICD-10-CM | POA: Diagnosis not present

## 2021-03-21 ENCOUNTER — Other Ambulatory Visit: Payer: Self-pay

## 2021-03-21 DIAGNOSIS — R002 Palpitations: Secondary | ICD-10-CM

## 2021-03-21 DIAGNOSIS — N400 Enlarged prostate without lower urinary tract symptoms: Secondary | ICD-10-CM | POA: Diagnosis not present

## 2021-03-21 DIAGNOSIS — E785 Hyperlipidemia, unspecified: Secondary | ICD-10-CM

## 2021-03-21 MED ORDER — LOSARTAN POTASSIUM 25 MG PO TABS: 25.0000 mg | ORAL_TABLET | Freq: Every day | ORAL | 3 refills | Status: DC

## 2021-03-22 ENCOUNTER — Telehealth: Payer: Self-pay | Admitting: Cardiology

## 2021-03-22 NOTE — Telephone Encounter (Signed)
 *  STAT* If patient is at the pharmacy, call can be transferred to refill team.   1. Which medications need to be refilled? (please list name of each medication and dose if known)  losartan (COZAAR) 25 MG tablet  2. Which pharmacy/location (including street and city if local pharmacy) is medication to be sent to? [facility] # [place], [place]  3. Do they need a 30 day or 90 day supply? 90 with refills   Patient states that due to supply chain shortages he was only getting 30 day fills. H would like to go back to getting a 90 day supply

## 2021-03-22 NOTE — Telephone Encounter (Signed)
Medication was sent into the pharmacy on yesterday, March 21, 2021 for a 90 day supply along with additional refills.

## 2021-03-28 DIAGNOSIS — N5201 Erectile dysfunction due to arterial insufficiency: Secondary | ICD-10-CM | POA: Diagnosis not present

## 2021-03-28 DIAGNOSIS — N400 Enlarged prostate without lower urinary tract symptoms: Secondary | ICD-10-CM | POA: Diagnosis not present

## 2021-05-01 DIAGNOSIS — J029 Acute pharyngitis, unspecified: Secondary | ICD-10-CM | POA: Diagnosis not present

## 2021-05-09 DIAGNOSIS — K219 Gastro-esophageal reflux disease without esophagitis: Secondary | ICD-10-CM | POA: Diagnosis not present

## 2021-05-09 DIAGNOSIS — Z8711 Personal history of peptic ulcer disease: Secondary | ICD-10-CM | POA: Diagnosis not present

## 2021-05-09 DIAGNOSIS — F419 Anxiety disorder, unspecified: Secondary | ICD-10-CM | POA: Diagnosis not present

## 2021-05-24 DIAGNOSIS — K449 Diaphragmatic hernia without obstruction or gangrene: Secondary | ICD-10-CM | POA: Diagnosis not present

## 2021-05-24 DIAGNOSIS — E7800 Pure hypercholesterolemia, unspecified: Secondary | ICD-10-CM | POA: Diagnosis not present

## 2021-05-24 DIAGNOSIS — I2510 Atherosclerotic heart disease of native coronary artery without angina pectoris: Secondary | ICD-10-CM | POA: Diagnosis not present

## 2021-05-24 DIAGNOSIS — R110 Nausea: Secondary | ICD-10-CM | POA: Diagnosis not present

## 2021-05-24 DIAGNOSIS — I10 Essential (primary) hypertension: Secondary | ICD-10-CM | POA: Diagnosis not present

## 2021-05-24 DIAGNOSIS — R1013 Epigastric pain: Secondary | ICD-10-CM | POA: Diagnosis not present

## 2021-05-24 DIAGNOSIS — K219 Gastro-esophageal reflux disease without esophagitis: Secondary | ICD-10-CM | POA: Diagnosis not present

## 2021-07-24 ENCOUNTER — Ambulatory Visit: Payer: PPO | Admitting: Cardiology

## 2021-08-22 ENCOUNTER — Telehealth: Payer: Self-pay | Admitting: Cardiology

## 2021-08-22 DIAGNOSIS — E785 Hyperlipidemia, unspecified: Secondary | ICD-10-CM

## 2021-08-22 MED ORDER — REPATHA SURECLICK 140 MG/ML ~~LOC~~ SOAJ: SUBCUTANEOUS | 3 refills | Status: DC

## 2021-08-22 NOTE — Telephone Encounter (Signed)
Called and lmomed the pt that the repatha was approved refill sent and pt voiced understanding

## 2021-08-22 NOTE — Telephone Encounter (Signed)
Awaiting questions on cmm will contact pt once approved. And pt voiced understanding  Eric Rhodes (Key: [id number]) Repatha SureClick 140MG /ML auto-injectors Called and spoke w/pt regarding the medicare part d new insurance is as follows; Id- [id number]  [id number]

## 2021-08-22 NOTE — Telephone Encounter (Signed)
Pt c/o medication issue:  1. Name of Medication: REPATHA SURECLICK 749 MG/ML SOAJ  2. How are you currently taking this medication (dosage and times per day)? As directed  3. Are you having a reaction (difficulty breathing--STAT)? no  4. What is your medication issue? Patient got a letter from his insurance company yesterday. He needs a prior authorization for his new rx plan that will start soon. The patient now gets his medications from [facility]. [id number] - [place], [place] - [address]  The # provided in the letter is [phone]

## 2021-08-22 NOTE — Addendum Note (Signed)
Addended by: Allean Found on: 08/22/2021 10:45 AM   Modules accepted: Orders

## 2021-11-12 ENCOUNTER — Telehealth: Payer: Self-pay | Admitting: Cardiology

## 2021-11-12 NOTE — Telephone Encounter (Signed)
? ?  Pt is requesting to switch from Dr. Johney Frame to Dr. Margaretann Loveless, he said his wife saw Dr. Margaretann Loveless and wanted to have the same heart doctor as her. ?

## 2021-11-12 NOTE — Telephone Encounter (Signed)
Okay with me 

## 2022-01-02 ENCOUNTER — Other Ambulatory Visit: Payer: Self-pay

## 2022-01-02 MED ORDER — METOPROLOL TARTRATE 25 MG PO TABS: 12.5000 mg | ORAL_TABLET | Freq: Two times a day (BID) | ORAL | 3 refills | Status: DC

## 2022-02-04 ENCOUNTER — Encounter: Payer: Self-pay | Admitting: Internal Medicine

## 2022-02-04 ENCOUNTER — Ambulatory Visit (INDEPENDENT_AMBULATORY_CARE_PROVIDER_SITE_OTHER): Payer: Medicare Other | Admitting: Internal Medicine

## 2022-02-04 VITALS — BP 126/78 | HR 56 | Ht 63.5 in | Wt 156.0 lb

## 2022-02-04 DIAGNOSIS — E785 Hyperlipidemia, unspecified: Secondary | ICD-10-CM

## 2022-02-04 DIAGNOSIS — I2510 Atherosclerotic heart disease of native coronary artery without angina pectoris: Secondary | ICD-10-CM

## 2022-02-04 DIAGNOSIS — R002 Palpitations: Secondary | ICD-10-CM | POA: Diagnosis not present

## 2022-02-04 DIAGNOSIS — R079 Chest pain, unspecified: Secondary | ICD-10-CM

## 2022-02-04 DIAGNOSIS — I10 Essential (primary) hypertension: Secondary | ICD-10-CM

## 2022-02-04 DIAGNOSIS — I2584 Coronary atherosclerosis due to calcified coronary lesion: Secondary | ICD-10-CM

## 2022-02-04 NOTE — Progress Notes (Incomplete)
Cardiology Office Note:    Date:  '@ENCDATE'$ @   ID:  Eric Rhodes, DOB 06/16/1956, MRN [id number]  PCP:  Eric Lass, MD  Cardiologist:  Eric Dawley, MD  Electrophysiologist:  None   Referring MD: Eric Lass, MD   Chief Complaint/Reason for Referral: ***  History of Present Illness:    Eric Rhodes is a 66 y.o. male with a history of *** here for follow-up.     Today,   When he stopped the metoprolol he experienced palpitations, he said when he goes to sleep at night "his heart pounds." He states he does not notice the palpitations during the day sitting up, compared to at night laying down.  For exercise, he goes on the bike three days a week with his wife. He does not experience chest angina when on the exercise bike. He also lifts more than 100 lbs, but he does not lift heavy frequently.  He reports that he does get chest pressure which he attributes to his GI tract. He manages this with over the counter medicine called 'Gas-X.'   ***When he was at the hospital, he had a problem with stomach ulcer and he was put on lipitor. He then adds that he began to feel worse after taking the lipitor.   He reports that he will be seen in the future for the pulmonary nodules and for a physical.   He has been taking Vitamin K2 supplements to manage his calcium, and Japanese soy beans. He remains compliant with Repatha and Crestor. He also takes Pepcide 20 mg for GERD.  Of note, his father has history of Emphysema and COPD due to working in the [employer]. 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  The patient denies shortness of breath, nocturnal dyspnea, orthopnea or peripheral edema. No lightheadedness or syncope. Complains of chest pressure.   Past Medical History:  Diagnosis Date   BPH (benign prostatic hyperplasia)    Chronic abdominal pain    Chronic insomnia    GERD (gastroesophageal reflux disease)     diet controlled   H/O ETOH abuse    Hernia, inguinal, left    History of colonoscopy 2004   History of endoscopy 2004   Hypertension    Irritable bowel disease    Prostatitis     Past Surgical History:  Procedure Laterality Date   ANTERIOR CRUCIATE LIGAMENT REPAIR Left 1978   BUNIONECTOMY     COLON MEDOFF     COLONOSCOPY     HEMORRHOIDAL BANDING  10/2014   HERNIA REPAIR Left 11/2011   INGUINAL HERNIA REPAIR  11/07/2011   Procedure: HERNIA REPAIR INGUINAL ADULT;  Surgeon: Eric Medal, MD;  Location: [place];  Service: General;  Laterality: Left;  Left Inguinal Herniorrhaphy with Mesh   IRRIGATION AND DEBRIDEMENT ELBOW Right 05/22/2020   Procedure: IRRIGATION AND DEBRIDEMENT RIGHT ELBOW;  Surgeon: Eric Ade, MD;  Location: [place];  Service: Orthopedics;  Laterality: Right;   IRRIGATION AND DEBRIDEMENT ELBOW Right 09/18/2020   Procedure: IRRIGATION AND DEBRIDEMENT ELBOW WITH SUTURE REMOVAL;  Surgeon: Eric Ade, MD;  Location: [place];  Service: Orthopedics;  Laterality: Right;   UPPER GASTROINTESTINAL ENDOSCOPY     WISDOM TOOTH EXTRACTION      Current Medications: Current Meds  Medication Sig   aspirin 81 MG EC tablet Take 81 mg by mouth at bedtime. Swallow whole.   celecoxib (CELEBREX) 200 MG  capsule Take 200 mg by mouth daily as needed (pain).   cetirizine (ZYRTEC) 10 MG tablet Take 10 mg by mouth daily as needed (seasonal allergies).   cholecalciferol (VITAMIN D) 1000 units tablet Take 1,000 Units by mouth daily.   Evolocumab (REPATHA SURECLICK) 081 MG/ML SOAJ INJECT THE CONTENTS OF 1 SYRINGE INTO THE SKIN ONCE EVERY 14 DAYS   losartan (COZAAR) 25 MG tablet Take 1 tablet (25 mg total) by mouth daily.   Magnesium 200 MG TABS Take by mouth.   metoprolol tartrate (LOPRESSOR) 25 MG tablet Take 0.5 tablets (12.5 mg total) by mouth 2 (two) times daily.   Multiple Vitamin (MULTIVITAMIN WITH MINERALS) TABS tablet Take 1 tablet by mouth  daily.   Omega-3 Fatty Acids (FISH OIL PO) Take 1 capsule by mouth 2 (two) times daily.   rosuvastatin (CRESTOR) 5 MG tablet Take 5 mg by mouth at bedtime.    triamcinolone ointment (KENALOG) 0.1 % Apply 1 application topically 2 (two) times daily as needed (rash).    vitamin B-12 (CYANOCOBALAMIN) 100 MCG tablet Take 100 mcg by mouth daily.   [DISCONTINUED] co-enzyme Q-10 30 MG capsule Take 30 mg by mouth daily.     Allergies:   Augmentin [amoxicillin-pot clavulanate], Bacitracin, Balsam, Benzyl cinnamate, Betadine [povidone iodine], Clavulanic acid, Gold sodium thiosulfate, Lanolin, Limonene, Linalool, Methylisothiazolinone, Other, Tea tree oil, and Neosporin [neomycin-bacitracin zn-polymyx]   Social History   Tobacco Use   Smoking status: Never   Smokeless tobacco: Never  Vaping Use   Vaping Use: Never used  Substance Use Topics   Alcohol use: No   Drug use: No     Family History: The patient's family history includes ADD / ADHD in his son and son; COPD in his father; Clotting disorder in his mother; Diabetes in his brother; Heart attack in his brother; Heart disease in his mother; Hypercholesterolemia in his mother; Hypertension in his father and mother; Myelodysplastic syndrome in his mother; Obesity in his brother; Psoriasis in his brother.  ROS:   Please see the history of present illness. (+) Chest Pressure/discomfort  (+) Palpitations All other systems reviewed and are negative.  EKGs/Labs/Other Studies Reviewed:    The following studies were reviewed today:  EKG:    02/04/2022 EKG: Rate 56. Sinus Bradycardia with PACs.   Imaging studies that I have independently reviewed today: ***  Recent Labs: No results found for requested labs within last 8760 hours.  Recent Lipid Panel    Component Value Date/Time   CHOL 91 (L) 03/27/2018 0755   TRIG 43 03/27/2018 0755   HDL 62 03/27/2018 0755   CHOLHDL 1.5 03/27/2018 0755   CHOLHDL 3.1 06/18/2017 0459   VLDL 6  06/18/2017 0459   LDLCALC 20 03/27/2018 0755    Physical Exam:    VS:  BP 126/78   Pulse (!) 56   Ht 5' 3.5" (1.613 m)   Wt 156 lb (70.8 kg)   SpO2 98%   BMI 27.20 kg/m     Wt Readings from Last 5 Encounters:  02/04/22 156 lb (70.8 kg)  02/01/21 159 lb (72.1 kg)  11/01/20 168 lb 8 oz (76.4 kg)  10/10/20 169 lb (76.7 kg)  09/20/20 170 lb (77.1 kg)    Constitutional: No acute distress Eyes: sclera non-icteric, normal conjunctiva and lids ENMT: normal dentition, moist mucous membranes Cardiovascular: regular rhythm, normal rate, no murmur. S1 and S2 normal. No jugular venous distention.  Respiratory: clear to auscultation bilaterally GI : normal bowel sounds, soft and  nontender. No distention.   MSK: extremities warm, well perfused. No edema.  NEURO: grossly nonfocal exam, moves all extremities. PSYCH: alert and oriented x 3, normal mood and affect.   ASSESSMENT:    No diagnosis found. PLAN:    No diagnosis found.  Total time of encounter: *** minutes total time of encounter, including *** minutes spent in face-to-face patient care on the date of this encounter. This time includes coordination of care and counseling regarding above mentioned problem list. Remainder of non-face-to-face time involved reviewing chart documents/testing relevant to the patient encounter and documentation in the medical record. I have independently reviewed documentation from referring provider.    ***may repeat a scan of the aorta  Cherlynn Kaiser, MD, [facility]   Shared Decision Making/Informed Consent:   {Are you ordering a CV Procedure (e.g. stress test, cath, DCCV, TEE, etc)?   Press F2        :[id number]}   Medication Adjustments/Labs and Tests Ordered: Current medicines are reviewed at length with the patient today.  Concerns regarding medicines are outlined above.   No orders of the defined types were placed in this encounter.   No orders of the defined  types were placed in this encounter.   There are no Patient Instructions on file for this visit.   F/U in 1 year  I,Tinashe Williams,acting as a scribe for Elouise Munroe, MD.,have documented all relevant documentation on the behalf of Elouise Munroe, MD,as directed by  Elouise Munroe, MD while in the presence of Elouise Munroe, MD.   ***

## 2022-02-04 NOTE — Patient Instructions (Signed)
Medication Instructions:  The current medical regimen is effective;  continue present plan and medications.  *If you need a refill on your cardiac medications before your next appointment, please call your pharmacy*   Lab Work: LPa, Apolipoprotein (we will send this over to your primary care to have drawn on Friday with your other labs, please call us if any questions/concerns)   If you have labs (blood work) drawn today and your tests are completely normal, you will receive your results only by: [place] (if you have MyChart) OR A paper copy in the mail If you have any lab test that is abnormal or we need to change your treatment, we will call you to review the results.   Follow-Up: At [facility], you and your health needs are our priority.  As part of our continuing mission to provide you with exceptional heart care, we have created designated Provider Care Teams.  These Care Teams include your primary Cardiologist (physician) and Advanced Practice Providers (APPs -  Physician Assistants and Nurse Practitioners) who all work together to provide you with the care you need, when you need it.  We recommend signing up for the patient portal called "MyChart".  Sign up information is provided on this After Visit Summary.  MyChart is used to connect with patients for Virtual Visits (Telemedicine).  Patients are able to view lab/test results, encounter notes, upcoming appointments, etc.  Non-urgent messages can be sent to your provider as well.   To learn more about what you can do with MyChart, go to [email or web address].    Your next appointment:   12 month(s)  The format for your next appointment:   In Person  Provider:   Cherlynn Kaiser, MD

## 2022-02-19 ENCOUNTER — Other Ambulatory Visit: Payer: Self-pay | Admitting: Internal Medicine

## 2022-02-19 DIAGNOSIS — E785 Hyperlipidemia, unspecified: Secondary | ICD-10-CM

## 2022-02-19 DIAGNOSIS — R002 Palpitations: Secondary | ICD-10-CM

## 2022-04-18 ENCOUNTER — Inpatient Hospital Stay (HOSPITAL_COMMUNITY)
Admission: EM | Admit: 2022-04-18 | Discharge: 2022-04-21 | DRG: 392 | Disposition: A | Discharge status: Home / Self Care | Payer: Medicare Other | Attending: Internal Medicine | Admitting: Internal Medicine

## 2022-04-18 DIAGNOSIS — K56609 Unspecified intestinal obstruction, unspecified as to partial versus complete obstruction: Secondary | ICD-10-CM | POA: Diagnosis not present

## 2022-04-18 DIAGNOSIS — K56600 Partial intestinal obstruction, unspecified as to cause: Secondary | ICD-10-CM | POA: Diagnosis present

## 2022-04-18 DIAGNOSIS — K529 Noninfective gastroenteritis and colitis, unspecified: Principal | ICD-10-CM | POA: Diagnosis present

## 2022-04-18 DIAGNOSIS — Z7982 Long term (current) use of aspirin: Secondary | ICD-10-CM

## 2022-04-18 DIAGNOSIS — E785 Hyperlipidemia, unspecified: Secondary | ICD-10-CM | POA: Diagnosis present

## 2022-04-18 DIAGNOSIS — Z8249 Family history of ischemic heart disease and other diseases of the circulatory system: Secondary | ICD-10-CM

## 2022-04-18 DIAGNOSIS — Z888 Allergy status to other drugs, medicaments and biological substances status: Secondary | ICD-10-CM

## 2022-04-18 DIAGNOSIS — K589 Irritable bowel syndrome without diarrhea: Secondary | ICD-10-CM | POA: Diagnosis present

## 2022-04-18 DIAGNOSIS — F419 Anxiety disorder, unspecified: Secondary | ICD-10-CM | POA: Diagnosis present

## 2022-04-18 DIAGNOSIS — R001 Bradycardia, unspecified: Secondary | ICD-10-CM | POA: Diagnosis present

## 2022-04-18 DIAGNOSIS — K869 Disease of pancreas, unspecified: Secondary | ICD-10-CM | POA: Diagnosis present

## 2022-04-18 DIAGNOSIS — Z79899 Other long term (current) drug therapy: Secondary | ICD-10-CM

## 2022-04-18 DIAGNOSIS — Z91048 Other nonmedicinal substance allergy status: Secondary | ICD-10-CM

## 2022-04-18 DIAGNOSIS — N400 Enlarged prostate without lower urinary tract symptoms: Secondary | ICD-10-CM | POA: Diagnosis present

## 2022-04-18 DIAGNOSIS — I10 Essential (primary) hypertension: Secondary | ICD-10-CM | POA: Diagnosis present

## 2022-04-18 DIAGNOSIS — Z8711 Personal history of peptic ulcer disease: Secondary | ICD-10-CM

## 2022-04-18 DIAGNOSIS — G8929 Other chronic pain: Secondary | ICD-10-CM | POA: Diagnosis present

## 2022-04-18 DIAGNOSIS — Z883 Allergy status to other anti-infective agents status: Secondary | ICD-10-CM

## 2022-04-18 DIAGNOSIS — F5104 Psychophysiologic insomnia: Secondary | ICD-10-CM | POA: Diagnosis present

## 2022-04-18 DIAGNOSIS — Z881 Allergy status to other antibiotic agents status: Secondary | ICD-10-CM

## 2022-04-18 DIAGNOSIS — K219 Gastro-esophageal reflux disease without esophagitis: Secondary | ICD-10-CM | POA: Diagnosis present

## 2022-04-18 DIAGNOSIS — A059 Bacterial foodborne intoxication, unspecified: Secondary | ICD-10-CM | POA: Diagnosis present

## 2022-04-18 LAB — URINALYSIS, ROUTINE W REFLEX MICROSCOPIC
Bilirubin Urine: NEGATIVE
Glucose, UA: NEGATIVE mg/dL
Hgb urine dipstick: NEGATIVE
Ketones, ur: NEGATIVE mg/dL
Leukocytes,Ua: NEGATIVE
Nitrite: NEGATIVE
Protein, ur: NEGATIVE mg/dL
Specific Gravity, Urine: 1.023 (ref 1.005–1.030)
pH: 6 (ref 5.0–8.0)

## 2022-04-18 LAB — CBC
HCT: 44.4 % (ref 39.0–52.0)
Hemoglobin: 15.1 g/dL (ref 13.0–17.0)
MCH: 31.1 pg (ref 26.0–34.0)
MCHC: 34 g/dL (ref 30.0–36.0)
MCV: 91.4 fL (ref 80.0–100.0)
Platelets: 217 10*3/uL (ref 150–400)
RBC: 4.86 MIL/uL (ref 4.22–5.81)
RDW: 12.4 % (ref 11.5–15.5)
WBC: 8.1 10*3/uL (ref 4.0–10.5)
nRBC: 0 % (ref 0.0–0.2)

## 2022-04-18 MED ORDER — ONDANSETRON 4 MG PO TBDP
4.0000 mg | ORAL_TABLET | Freq: Once | ORAL | Status: AC | PRN
  Administered 2022-04-18: 4 mg via ORAL

## 2022-04-18 NOTE — ED Triage Notes (Signed)
Pt states stomach has been "killing" him since 1930, associated nausea, cramping. Advises he had chorizo burrito for dinner, that gasx did not help. Endorses 3-4 bowel movements since, but denies diarrhea. Reports abd feels distended & rigid.

## 2022-04-19 ENCOUNTER — Other Ambulatory Visit: Payer: Self-pay

## 2022-04-19 ENCOUNTER — Emergency Department (HOSPITAL_COMMUNITY): Payer: Medicare Other

## 2022-04-19 ENCOUNTER — Inpatient Hospital Stay (HOSPITAL_COMMUNITY): Payer: Medicare Other

## 2022-04-19 ENCOUNTER — Encounter (HOSPITAL_COMMUNITY): Payer: Self-pay | Admitting: Radiology

## 2022-04-19 DIAGNOSIS — I10 Essential (primary) hypertension: Secondary | ICD-10-CM

## 2022-04-19 DIAGNOSIS — F419 Anxiety disorder, unspecified: Secondary | ICD-10-CM

## 2022-04-19 DIAGNOSIS — K56600 Partial intestinal obstruction, unspecified as to cause: Secondary | ICD-10-CM

## 2022-04-19 DIAGNOSIS — N400 Enlarged prostate without lower urinary tract symptoms: Secondary | ICD-10-CM | POA: Diagnosis present

## 2022-04-19 DIAGNOSIS — E785 Hyperlipidemia, unspecified: Secondary | ICD-10-CM | POA: Diagnosis present

## 2022-04-19 DIAGNOSIS — K529 Noninfective gastroenteritis and colitis, unspecified: Secondary | ICD-10-CM | POA: Diagnosis present

## 2022-04-19 DIAGNOSIS — K589 Irritable bowel syndrome without diarrhea: Secondary | ICD-10-CM | POA: Diagnosis not present

## 2022-04-19 DIAGNOSIS — R001 Bradycardia, unspecified: Secondary | ICD-10-CM

## 2022-04-19 DIAGNOSIS — Z8711 Personal history of peptic ulcer disease: Secondary | ICD-10-CM | POA: Diagnosis not present

## 2022-04-19 DIAGNOSIS — Z888 Allergy status to other drugs, medicaments and biological substances status: Secondary | ICD-10-CM | POA: Diagnosis not present

## 2022-04-19 DIAGNOSIS — K219 Gastro-esophageal reflux disease without esophagitis: Secondary | ICD-10-CM | POA: Diagnosis present

## 2022-04-19 DIAGNOSIS — K869 Disease of pancreas, unspecified: Secondary | ICD-10-CM | POA: Diagnosis present

## 2022-04-19 DIAGNOSIS — A059 Bacterial foodborne intoxication, unspecified: Secondary | ICD-10-CM | POA: Diagnosis present

## 2022-04-19 DIAGNOSIS — Z7982 Long term (current) use of aspirin: Secondary | ICD-10-CM | POA: Diagnosis not present

## 2022-04-19 DIAGNOSIS — Z881 Allergy status to other antibiotic agents status: Secondary | ICD-10-CM | POA: Diagnosis not present

## 2022-04-19 DIAGNOSIS — Z91048 Other nonmedicinal substance allergy status: Secondary | ICD-10-CM | POA: Diagnosis not present

## 2022-04-19 DIAGNOSIS — G8929 Other chronic pain: Secondary | ICD-10-CM | POA: Diagnosis present

## 2022-04-19 DIAGNOSIS — K56609 Unspecified intestinal obstruction, unspecified as to partial versus complete obstruction: Secondary | ICD-10-CM | POA: Diagnosis present

## 2022-04-19 DIAGNOSIS — Z79899 Other long term (current) drug therapy: Secondary | ICD-10-CM | POA: Diagnosis not present

## 2022-04-19 DIAGNOSIS — Z8249 Family history of ischemic heart disease and other diseases of the circulatory system: Secondary | ICD-10-CM | POA: Diagnosis not present

## 2022-04-19 DIAGNOSIS — Z883 Allergy status to other anti-infective agents status: Secondary | ICD-10-CM | POA: Diagnosis not present

## 2022-04-19 DIAGNOSIS — F5104 Psychophysiologic insomnia: Secondary | ICD-10-CM | POA: Diagnosis present

## 2022-04-19 LAB — COMPREHENSIVE METABOLIC PANEL
ALT: 21 U/L (ref 0–44)
AST: 21 U/L (ref 15–41)
Albumin: 4.1 g/dL (ref 3.5–5.0)
Alkaline Phosphatase: 47 U/L (ref 38–126)
Anion gap: 10 (ref 5–15)
BUN: 23 mg/dL (ref 8–23)
CO2: 26 mmol/L (ref 22–32)
Calcium: 9.2 mg/dL (ref 8.9–10.3)
Chloride: 103 mmol/L (ref 98–111)
Creatinine, Ser: 1.22 mg/dL (ref 0.61–1.24)
GFR, Estimated: >60 mL/min (ref >60)
Glucose, Bld: 114 mg/dL — ABNORMAL HIGH (ref 70–99)
Potassium: 4 mmol/L (ref 3.5–5.1)
Sodium: 139 mmol/L (ref 135–145)
Total Bilirubin: 1.2 mg/dL (ref 0.3–1.2)
Total Protein: 6.5 g/dL (ref 6.5–8.1)

## 2022-04-19 LAB — HIV ANTIBODY (ROUTINE TESTING W REFLEX): HIV Screen 4th Generation wRfx: NONREACTIVE

## 2022-04-19 LAB — LIPASE, BLOOD: Lipase: 36 U/L (ref 11–51)

## 2022-04-19 MED ORDER — MORPHINE SULFATE (PF) 2 MG/ML IV SOLN: 2.0000 mg | INTRAVENOUS | Status: DC | PRN

## 2022-04-19 MED ORDER — ENOXAPARIN SODIUM 40 MG/0.4ML IJ SOSY
40.0000 mg | PREFILLED_SYRINGE | Freq: Every day | INTRAMUSCULAR | Status: DC
  Administered 2022-04-19 – 2022-04-21 (×3): 40 mg via SUBCUTANEOUS
  Filled 2022-04-19 (×3): qty 0.4

## 2022-04-19 MED ORDER — HYDROMORPHONE HCL 1 MG/ML IJ SOLN
0.5000 mg | Freq: Once | INTRAMUSCULAR | Status: AC
  Administered 2022-04-19: 0.5 mg via INTRAVENOUS
  Filled 2022-04-19: qty 1

## 2022-04-19 MED ORDER — ONDANSETRON HCL 4 MG/2ML IJ SOLN: 4.0000 mg | Freq: Once | INTRAMUSCULAR | Status: DC | PRN

## 2022-04-19 MED ORDER — PANTOPRAZOLE SODIUM 40 MG IV SOLR
40.0000 mg | Freq: Two times a day (BID) | INTRAVENOUS | Status: DC
  Administered 2022-04-19 – 2022-04-21 (×4): 40 mg via INTRAVENOUS
  Filled 2022-04-19 (×4): qty 10

## 2022-04-19 MED ORDER — SODIUM CHLORIDE 0.9 % IV SOLN
INTRAVENOUS | Status: DC
Start: 2022-04-19 — End: 2022-04-19

## 2022-04-19 MED ORDER — ONDANSETRON HCL 4 MG/2ML IJ SOLN: 4.0000 mg | Freq: Four times a day (QID) | INTRAMUSCULAR | Status: DC | PRN

## 2022-04-19 MED ORDER — ALUM & MAG HYDROXIDE-SIMETH 200-200-20 MG/5ML PO SUSP
30.0000 mL | Freq: Once | ORAL | Status: AC
  Administered 2022-04-19: 30 mL via ORAL
  Filled 2022-04-19: qty 30

## 2022-04-19 MED ORDER — PANTOPRAZOLE SODIUM 40 MG IV SOLR
40.0000 mg | Freq: Once | INTRAVENOUS | Status: AC
  Administered 2022-04-19: 40 mg via INTRAVENOUS
  Filled 2022-04-19: qty 10

## 2022-04-19 MED ORDER — ALBUTEROL SULFATE (2.5 MG/3ML) 0.083% IN NEBU
2.5000 mg | INHALATION_SOLUTION | Freq: Four times a day (QID) | RESPIRATORY_TRACT | Status: DC | PRN
Start: 2022-04-19 — End: 2022-04-21

## 2022-04-19 MED ORDER — HYDRALAZINE HCL 20 MG/ML IJ SOLN: 10.0000 mg | INTRAMUSCULAR | Status: DC | PRN

## 2022-04-19 MED ORDER — IOHEXOL 300 MG/ML  SOLN
100.0000 mL | Freq: Once | INTRAMUSCULAR | Status: DC | PRN
Start: 2022-04-19 — End: 2022-04-19

## 2022-04-19 MED ORDER — IOHEXOL 300 MG/ML  SOLN
100.0000 mL | Freq: Once | INTRAMUSCULAR | Status: AC | PRN
  Administered 2022-04-19: 100 mL via INTRAVENOUS

## 2022-04-19 MED ORDER — SODIUM CHLORIDE 0.9% FLUSH
3.0000 mL | Freq: Two times a day (BID) | INTRAVENOUS | Status: DC
  Administered 2022-04-19 – 2022-04-21 (×5): 3 mL via INTRAVENOUS

## 2022-04-19 MED ORDER — ACETAMINOPHEN 325 MG PO TABS
650.0000 mg | ORAL_TABLET | Freq: Four times a day (QID) | ORAL | Status: DC | PRN
  Administered 2022-04-19: 650 mg via ORAL
  Filled 2022-04-19: qty 2

## 2022-04-19 MED ORDER — DIATRIZOATE MEGLUMINE & SODIUM 66-10 % PO SOLN
90.0000 mL | Freq: Once | ORAL | Status: AC
Start: 2022-04-19 — End: 2022-04-19
  Administered 2022-04-19: 90 mL via NASOGASTRIC
  Filled 2022-04-19: qty 90

## 2022-04-19 MED ORDER — MORPHINE SULFATE (PF) 2 MG/ML IV SOLN
2.0000 mg | INTRAVENOUS | Status: DC | PRN
  Administered 2022-04-19 – 2022-04-20 (×3): 2 mg via INTRAVENOUS
  Filled 2022-04-19 (×3): qty 1

## 2022-04-19 MED ORDER — CHLORHEXIDINE GLUCONATE CLOTH 2 % EX PADS: 6.0000 | MEDICATED_PAD | Freq: Every day | CUTANEOUS | Status: DC

## 2022-04-19 MED ORDER — LORAZEPAM 2 MG/ML IJ SOLN: 0.5000 mg | Freq: Every evening | INTRAMUSCULAR | Status: DC | PRN

## 2022-04-19 MED ORDER — ONDANSETRON HCL 4 MG PO TABS: 4.0000 mg | ORAL_TABLET | Freq: Four times a day (QID) | ORAL | Status: DC | PRN

## 2022-04-19 MED ORDER — SODIUM CHLORIDE 0.9 % IV SOLN
INTRAVENOUS | Status: DC
  Administered 2022-04-19: 75 mL via INTRAVENOUS

## 2022-04-19 MED ORDER — LORAZEPAM 2 MG/ML IJ SOLN
0.5000 mg | Freq: Two times a day (BID) | INTRAMUSCULAR | Status: DC | PRN
  Administered 2022-04-20 (×3): 0.5 mg via INTRAVENOUS
  Filled 2022-04-19 (×3): qty 1

## 2022-04-19 MED ORDER — ACETAMINOPHEN 650 MG RE SUPP: 650.0000 mg | Freq: Four times a day (QID) | RECTAL | Status: DC | PRN

## 2022-04-19 NOTE — ED Notes (Signed)
Triage nurse explained delay and wait time to patient .

## 2022-04-19 NOTE — ED Notes (Signed)
Patient reconnected back onto low suctioning, gastric content noted. Pt expresses nasal flares being extremely sore from the insertion of NGT and continued abdominal pain.

## 2022-04-19 NOTE — ED Provider Notes (Addendum)
[facility] EMERGENCY DEPARTMENT Provider Note   CSN: [id number] Arrival date & time: 04/18/22  2144     History  Chief Complaint  Patient presents with   Abdominal Pain   Nausea    Eric Rhodes is a 66 y.o. male.  HPI 66 year old male presents with severe abdominal pain.  Started around 7:30 PM last night.  About an hour and a half before this he had about a half a burrito.  He states he has a lot of reflux and about 5 years ago was diagnosed with an ulcer.  This feels similar to that.  He states he is having a lot of burning and the burning is going up into his chest as it has in the past.  He has felt nauseated but cannot vomit.  He was given Zofran in the waiting room which temporarily helped.  He also took sodium bicarb and Gas-X which very minimally helped.  Pain is about a 9 currently.  Feels like his abdomen is "rigid".  He has had about 3-4 bowel movement since coming into the hospital but has not noticed if there is any blood or black stools.  Home Medications Prior to Admission medications   Medication Sig Start Date End Date Taking? Authorizing Provider  aspirin 81 MG EC tablet Take 81 mg by mouth at bedtime. Swallow whole.    [provider]  celecoxib (CELEBREX) 200 MG capsule Take 200 mg by mouth daily as needed (pain).    [provider]  cetirizine (ZYRTEC) 10 MG tablet Take 10 mg by mouth daily as needed (seasonal allergies).    [provider]  cholecalciferol (VITAMIN D) 1000 units tablet Take 1,000 Units by mouth daily.    [provider]  Evolocumab (REPATHA SURECLICK) 563 MG/ML SOAJ INJECT THE CONTENTS OF 1 SYRINGE INTO THE SKIN ONCE EVERY 14 DAYS 08/22/21   Freada Bergeron, MD  losartan (COZAAR) 25 MG tablet TAKE 1 TABLET BY MOUTH DAILY 02/19/22   Elouise Munroe, MD  Magnesium 200 MG TABS Take by mouth.    [provider]  metoprolol tartrate (LOPRESSOR) 25 MG tablet Take 0.5 tablets  (12.5 mg total) by mouth 2 (two) times daily. 01/02/22   Freada Bergeron, MD  Multiple Vitamin (MULTIVITAMIN WITH MINERALS) TABS tablet Take 1 tablet by mouth daily.    [provider]  Omega-3 Fatty Acids (FISH OIL PO) Take 1 capsule by mouth 2 (two) times daily.    [provider]  rosuvastatin (CRESTOR) 5 MG tablet Take 5 mg by mouth at bedtime.  02/12/19   [provider]  triamcinolone ointment (KENALOG) 0.1 % Apply 1 application topically 2 (two) times daily as needed (rash).  02/02/20   [provider]  vitamin B-12 (CYANOCOBALAMIN) 100 MCG tablet Take 100 mcg by mouth daily.    [provider]      Allergies    Augmentin [amoxicillin-pot clavulanate], Bacitracin, Balsam, Benzyl cinnamate, Betadine [povidone iodine], Clavulanic acid, Gold sodium thiosulfate, Lanolin, Limonene, Linalool, Methylisothiazolinone, Other, Tea tree oil, and Neosporin [neomycin-bacitracin zn-polymyx]    Review of Systems   Review of Systems  Constitutional:  Negative for fever.  Gastrointestinal:  Positive for abdominal pain and nausea. Negative for vomiting.    Physical Exam Updated Vital Signs BP 129/80 (BP Location: Right Arm)   Pulse 64   Temp 98.1 F (36.7 C)   Resp 15   SpO2 97%  Physical Exam Vitals and nursing note  reviewed.  Constitutional:      Appearance: He is well-developed. He is not diaphoretic.  HENT:     Head: Normocephalic and atraumatic.  Cardiovascular:     Rate and Rhythm: Normal rate and regular rhythm.     Heart sounds: Normal heart sounds.  Pulmonary:     Effort: Pulmonary effort is normal.  Abdominal:     Palpations: Abdomen is soft.     Tenderness: There is abdominal tenderness (mostly upper abdominal tenderness with guarding). There is guarding.  Skin:    General: Skin is warm and dry.  Neurological:     Mental Status: He is alert.     ED Results / Procedures / Treatments   Labs (all labs ordered are listed, but  only abnormal results are displayed) Labs Reviewed  COMPREHENSIVE METABOLIC PANEL - Abnormal; Notable for the following components:      Result Value   Glucose, Bld 114 (*)    All other components within normal limits  LIPASE, BLOOD  CBC  URINALYSIS, ROUTINE W REFLEX MICROSCOPIC    EKG None  Radiology No results found.  Procedures Procedures    Medications Ordered in ED Medications  HYDROmorphone (DILAUDID) injection 0.5 mg (has no administration in time range)  ondansetron (ZOFRAN) injection 4 mg (has no administration in time range)  pantoprazole (PROTONIX) injection 40 mg (has no administration in time range)  alum & mag hydroxide-simeth (MAALOX/MYLANTA) 200-200-20 MG/5ML suspension 30 mL (has no administration in time range)  ondansetron (ZOFRAN-ODT) disintegrating tablet 4 mg (4 mg Oral Given 04/18/22 2202)    ED Course/ Medical Decision Making/ A&P                           Medical Decision Making Amount and/or Complexity of Data Reviewed Labs: ordered.    Details: WBC, hemoglobin, lipase are all normal. Radiology: ordered and independent interpretation performed.    Details: Small bowel obstruction.  No free air.  Risk OTC drugs. Prescription drug management. Decision regarding hospitalization.   CT shows what appears to be a small bowel obstruction.  No free air noted.  While gastritis/PUD could be playing a role, I think his symptoms are much more consistent with SBO, especially with the abdominal pain and feeling of distention.  At this point he is not having any further nausea/vomiting so do not think an immediate NG is needed.  I have discussed with Saverio Danker, general surgery, and they will consult but will need medical admission.  We will start on IV fluids.  He is feeling a lot better after IV pain medicine.  Of note he also has the pancreatic head hypodensity which will need follow-up but does not appear to be playing an emergent role today.  Discussed  with Dr. Tamala Julian for admission.        Final Clinical Impression(s) / ED Diagnoses Final diagnoses:  Small bowel obstruction [facility])    Rx / DC Orders ED Discharge Orders     None         Sherwood Gambler, MD 04/19/22 1001    Sherwood Gambler, MD 04/19/22 1001

## 2022-04-19 NOTE — H&P (Signed)
History and Physical    Patient: Eric Rhodes MWU:[id number] DOB: 1956/06/30 DOA: 04/18/2022 DOS: the patient was seen and examined on 04/19/2022 PCP: Kathyrn Lass, MD  Patient coming from: Home  Chief Complaint:  Chief Complaint  Patient presents with   Abdominal Pain   Nausea   HPI: Eric Rhodes is a 66 y.o. male with medical history significant of hypertension, BPH, PUD, GERD, and prior inguinal hernia repair in 2013 who present with complaints of abdominal pain starting last night around 7:30 PM about an hour after eating a burrito.  He reported having generalized sharp crampy abdominal pain that he rated at its worst 9 out of 10.Marland Kitchen  Noted associated symptoms of nausea, but was unable to vomit.  Also reported having heartburn.  He tried taking Gas-X without improvement in symptoms.  After getting to the hospital patient had 4 bowel movement which were nonbloody in appearance.  He reports that over 20-25 years ago he had a Campylobacter infection that caused him to develop intussusception and was reversed with a enema without need of surgery.  On admission to the emergency department patient was noted to have heart rates 54-64, and all other vital signs stable.  Labs were relatively unremarkable.  CT scan of the abdomen pelvis noted findings suggestive of early/partial small bowel obstruction without definitive transition point and a 1.1 cm heterogeneous hypodensity over the inferior aspect of the pancreatic head which was indeterminate.  Patient has been given antiemetics, Protonix 40 mg IV, and started on normal saline IV fluids  Review of Systems: As mentioned in the history of present illness. All other systems reviewed and are negative. Past Medical History:  Diagnosis Date   BPH (benign prostatic hyperplasia)    Chronic abdominal pain    Chronic insomnia    GERD (gastroesophageal reflux disease)    diet controlled   H/O ETOH abuse    Hernia, inguinal, left    History  of colonoscopy 2004   History of endoscopy 2004   Hypertension    Irritable bowel disease    Prostatitis    Past Surgical History:  Procedure Laterality Date   ANTERIOR CRUCIATE LIGAMENT REPAIR Left 1978   BUNIONECTOMY     COLON MEDOFF     COLONOSCOPY     HEMORRHOIDAL BANDING  10/2014   HERNIA REPAIR Left 11/2011   INGUINAL HERNIA REPAIR  11/07/2011   Procedure: HERNIA REPAIR INGUINAL ADULT;  Surgeon: Shann Medal, MD;  Location: [place];  Service: General;  Laterality: Left;  Left Inguinal Herniorrhaphy with Mesh   IRRIGATION AND DEBRIDEMENT ELBOW Right 05/22/2020   Procedure: IRRIGATION AND DEBRIDEMENT RIGHT ELBOW;  Surgeon: Tania Ade, MD;  Location: [place];  Service: Orthopedics;  Laterality: Right;   IRRIGATION AND DEBRIDEMENT ELBOW Right 09/18/2020   Procedure: IRRIGATION AND DEBRIDEMENT ELBOW WITH SUTURE REMOVAL;  Surgeon: Tania Ade, MD;  Location: [place];  Service: Orthopedics;  Laterality: Right;   UPPER GASTROINTESTINAL ENDOSCOPY     WISDOM TOOTH EXTRACTION     Social History:  reports that he has never smoked. He has never used smokeless tobacco. He reports that he does not drink alcohol and does not use drugs.  Allergies  Allergen Reactions   Augmentin [Amoxicillin-Pot Clavulanate] Other (See Comments)    Causes GI cramps   Bacitracin Other (See Comments)    Positive patch test   Balsam Other (See Comments)    Positive patch test   Benzyl Cinnamate Other (See  Comments)    Positive patch test   Betadine [Povidone Iodine] Other (See Comments)    Skin sensitive   Clavulanic Acid Other (See Comments)    GI Cramps   Gold Sodium Thiosulfate Other (See Comments)    Positive patch test   Lanolin Other (See Comments)    Positive patch test   Limonene Other (See Comments)    Positive patch test   Linalool Other (See Comments)    Positive patch test   Methylisothiazolinone Other (See Comments)    Positive patch test      Other Other (See Comments)    Cinnamic Alcohol-Positive patch test Cinnamic Aldehyde-Positive patch test Compositae Mix-Positive patch test Dimethylaminopropylamine-Positive patch test Disperse Dye Mix-Positive patch test Quaternium-15-Positive patch test Fragrance mix I-Positive patch test Fragrance mix II-Positive patch test   Tea Tree Oil Other (See Comments)    Positive patch test   Neosporin [Neomycin-Bacitracin Zn-Polymyx] Rash    Contact dermatitis    Family History  Problem Relation Age of Onset   Heart disease Mother        Bradycardia/pacemaker   Myelodysplastic syndrome Mother        chronic   Hypertension Mother    Clotting disorder Mother    Hypercholesterolemia Mother    COPD Father        respiratory failure   Hypertension Father    Diabetes Brother    Psoriasis Brother    Obesity Brother    Heart attack Brother    ADD / ADHD Son    ADD / ADHD Son     Prior to Admission medications   Medication Sig Start Date End Date Taking? Authorizing Provider  aspirin 81 MG EC tablet Take 81 mg by mouth at bedtime. Swallow whole.    [provider]  celecoxib (CELEBREX) 200 MG capsule Take 200 mg by mouth daily as needed (pain).    [provider]  cetirizine (ZYRTEC) 10 MG tablet Take 10 mg by mouth daily as needed (seasonal allergies).    [provider]  cholecalciferol (VITAMIN D) 1000 units tablet Take 1,000 Units by mouth daily.    [provider]  Evolocumab (REPATHA SURECLICK) 458 MG/ML SOAJ INJECT THE CONTENTS OF 1 SYRINGE INTO THE SKIN ONCE EVERY 14 DAYS 08/22/21   Freada Bergeron, MD  losartan (COZAAR) 25 MG tablet TAKE 1 TABLET BY MOUTH DAILY 02/19/22   Elouise Munroe, MD  Magnesium 200 MG TABS Take by mouth.    [provider]  metoprolol tartrate (LOPRESSOR) 25 MG tablet Take 0.5 tablets (12.5 mg total) by mouth 2 (two) times daily. 01/02/22   Freada Bergeron, MD  Multiple Vitamin (MULTIVITAMIN  WITH MINERALS) TABS tablet Take 1 tablet by mouth daily.    [provider]  Omega-3 Fatty Acids (FISH OIL PO) Take 1 capsule by mouth 2 (two) times daily.    [provider]  rosuvastatin (CRESTOR) 5 MG tablet Take 5 mg by mouth at bedtime.  02/12/19   [provider]  triamcinolone ointment (KENALOG) 0.1 % Apply 1 application topically 2 (two) times daily as needed (rash).  02/02/20   [provider]  vitamin B-12 (CYANOCOBALAMIN) 100 MCG tablet Take 100 mcg by mouth daily.    [provider]    Physical Exam: Vitals:   04/19/22 0501 04/19/22 0750 04/19/22 0809 04/19/22 0911  BP: 129/80 109/68 115/67 104/63  Pulse: 64 (!) 56 (!) 59 (!) 54  Resp: 15  13  Temp: 98.1 F (36.7 C)  98.1 F (36.7 C)   TempSrc:   Oral   SpO2: 97% 96% 96% 94%   Constitutional: Elderly male who appears to be in discomfort Eyes: PERRL, lids and conjunctivae normal ENMT: Mucous membranes are moist.  NG tube in place draining gastric contents with no signs of blood present. Neck: normal, supple,  Respiratory: clear to auscultation bilaterally, no wheezing, no crackles. Normal respiratory effort. No accessory muscle use.  Cardiovascular: Regular rate and rhythm, no murmurs / rubs / gallops. No extremity edema. 2+ pedal pulses. No carotid bruits.  Abdomen: no tenderness, no masses palpated. No hepatosplenomegaly. Bowel sounds positive.  Musculoskeletal: no clubbing / cyanosis. No joint deformity upper and lower extremities. Good ROM, no contractures. Normal muscle tone.  Skin: no rashes, lesions, ulcers. No induration Neurologic: CN 2-12 grossly intact. Sensation intact, DTR normal. Strength 5/5 in all 4.  Psychiatric: Normal judgment and insight. Alert and oriented x 3. Normal mood.   Data Reviewed:  Reviewed pertinent labs, imaging, and pertinent records as noted above in HPI  Assessment and Plan: Partial small bowel obstruction  Acute.  Patient present with  complaints of generalized abdominal pain.  He has had several bowel movements since being in the emergency department.  CT scan of the abdomen pelvis noted partial/early small bowel obstruction without definitive transition point and area of small bowel thickening in the right of the abdomen concerning for for enteritis.  Patient with a prior history of Campylobacter infection over 25 years ago which led to having intussusception -Admit to a medical telemetry bed -Monitor intake and output -N.p.o. -NGT to suction -Check GI panel given prior history -Normal saline IV fluids at 75 mL/h -Antiemetics as needed -Morphine IV as needed for pain -Follow-up serial abdominal x-rays -Appreciate general surgery consultative services, we will follow-up for any further recommendations  Asymptomatic bradycardia Heart rates noted to be 49-65.  Blood pressures currently maintained. -Continue to monitor  Essential hypertension Home blood pressure regimen includes losartan 25 mg daily and metoprolol 12.5 mg twice daily. -Hold home oral blood pressure regimen -Hydralazine IV as needed for elevated blood pressures due to bradycardia  Lesion of pancreas a 1.1 cm heterogeneous hypodensity over the inferior aspect of the pancreatic head which was indeterminate on CT. -Consider obtaining MRI of the abdomen pelvis with and without contrast  Anxiety Home medication regimen includes clonazepam 0.5 mg 2 times daily as needed for anxiety. -Ativan 0.5 mg IV as needed for anxiety  Hyperlipidemia -Hold Crestor.  Resume when medically appropriate  GERD -Protonix 40 mg IV twice daily  DVT prophylaxis: Lovenox Advance Care Planning:   Code Status: Full Code   Consults: General surgery Family Communication: None requested  Severity of Illness: The appropriate patient status for this patient is INPATIENT. Inpatient status is judged to be reasonable and necessary in order to provide the required intensity of  service to ensure the patient's safety. The patient's presenting symptoms, physical exam findings, and initial radiographic and laboratory data in the context of their chronic comorbidities is felt to place them at high risk for further clinical deterioration. Furthermore, it is not anticipated that the patient will be medically stable for discharge from the hospital within 2 midnights of admission.   * I certify that at the point of admission it is my clinical judgment that the patient will require inpatient hospital care spanning beyond 2 midnights from the point of admission due to high intensity of service, high risk for  further deterioration and high frequency of surveillance required.*  Author: Norval Morton, MD 04/19/2022 10:02 AM  For on call review [email or web address].

## 2022-04-19 NOTE — Consult Note (Signed)
[facility] Surgery Consult Note  Eric Rhodes 06/16/1956  [id number].    Requesting MD: Sherwood Gambler Chief Complaint/Reason for Consult: SBO  HPI:  Eric Rhodes is a 66 y.o. male PMH HTN, BPH, PUD, GERD, and prior h/o inguinal hernia repair who presented to [facility] last night complaining of acute onset severe abdominal pain. States that it started around 1930 last night, about 1.5 hours after eating a burrito. Pain is located throughout his abdomen. Described as burning and radiating into his chest. Associated with nausea, no emesis. Tried taking Gas-X but this did not help. He has had 3-4 solid Bms since coming to the hospital. But then 2 episodes of diarrhea after his CT scan. CT scan today shows early/partial small bowel obstruction without definite transition point, segment of small bowel over the right abdomen with thickened enhancing wall possibly due to intrinsic small bowel regional enteritis of infectious or inflammatory nature with secondary early/partial obstruction with transition point suggested over the anterior right mid abdomen General surgery asked to see.  Family History  Problem Relation Age of Onset   Heart disease Mother        Bradycardia/pacemaker   Myelodysplastic syndrome Mother        chronic   Hypertension Mother    Clotting disorder Mother    Hypercholesterolemia Mother    COPD Father        respiratory failure   Hypertension Father    Diabetes Brother    Psoriasis Brother    Obesity Brother    Heart attack Brother    ADD / ADHD Son    ADD / ADHD Son     Past Medical History:  Diagnosis Date   BPH (benign prostatic hyperplasia)    Chronic abdominal pain    Chronic insomnia    GERD (gastroesophageal reflux disease)    diet controlled   H/O ETOH abuse    Hernia, inguinal, left    History of colonoscopy 2004   History of endoscopy 2004   Hypertension    Irritable bowel disease    Prostatitis     Past Surgical History:   Procedure Laterality Date   ANTERIOR CRUCIATE LIGAMENT REPAIR Left 1978   BUNIONECTOMY     COLON MEDOFF     COLONOSCOPY     HEMORRHOIDAL BANDING  10/2014   HERNIA REPAIR Left 11/2011   INGUINAL HERNIA REPAIR  11/07/2011   Procedure: HERNIA REPAIR INGUINAL ADULT;  Surgeon: Shann Medal, MD;  Location: [place];  Service: General;  Laterality: Left;  Left Inguinal Herniorrhaphy with Mesh   IRRIGATION AND DEBRIDEMENT ELBOW Right 05/22/2020   Procedure: IRRIGATION AND DEBRIDEMENT RIGHT ELBOW;  Surgeon: Tania Ade, MD;  Location: [place];  Service: Orthopedics;  Laterality: Right;   IRRIGATION AND DEBRIDEMENT ELBOW Right 09/18/2020   Procedure: IRRIGATION AND DEBRIDEMENT ELBOW WITH SUTURE REMOVAL;  Surgeon: Tania Ade, MD;  Location: [place];  Service: Orthopedics;  Laterality: Right;   UPPER GASTROINTESTINAL ENDOSCOPY     WISDOM TOOTH EXTRACTION      Social History:  reports that he has never smoked. He has never used smokeless tobacco. He reports that he does not drink alcohol and does not use drugs.  Allergies:  Allergies  Allergen Reactions   Augmentin [Amoxicillin-Pot Clavulanate] Other (See Comments)    Causes GI cramps   Bacitracin Other (See Comments)    Positive patch test   Balsam Other (See Comments)    Positive patch test  Benzyl Cinnamate Other (See Comments)    Positive patch test   Betadine [Povidone Iodine] Other (See Comments)    Skin sensitive   Clavulanic Acid Other (See Comments)    GI Cramps   Doxycycline Hives   Gold Sodium Thiosulfate Other (See Comments)    Positive patch test   Lanolin Other (See Comments)    Positive patch test   Limonene Other (See Comments)    Positive patch test   Linalool Other (See Comments)    Positive patch test   Methylisothiazolinone Other (See Comments)    Positive patch test     Other Other (See Comments)    Cinnamic Alcohol-Positive patch test Cinnamic Aldehyde-Positive  patch test Compositae Mix-Positive patch test Dimethylaminopropylamine-Positive patch test Disperse Dye Mix-Positive patch test Quaternium-15-Positive patch test Fragrance mix I-Positive patch test Fragrance mix II-Positive patch test   Tea Tree Oil Other (See Comments)    Positive patch test   Neosporin [Neomycin-Bacitracin Zn-Polymyx] Rash    Contact dermatitis    (Not in a hospital admission)   Prior to Admission medications   Medication Sig Start Date End Date Taking? Authorizing Provider  aspirin 81 MG EC tablet Take 81 mg by mouth at bedtime. Swallow whole.   Yes [provider]  clonazePAM (KLONOPIN) 0.5 MG tablet Take 0.5 mg by mouth 2 (two) times daily as needed. 03/27/22  Yes [provider]  Evolocumab (REPATHA SURECLICK) 937 MG/ML SOAJ INJECT THE CONTENTS OF 1 SYRINGE INTO THE SKIN ONCE EVERY 14 DAYS 08/22/21  Yes Freada Bergeron, MD  fluticasone (FLONASE) 50 MCG/ACT nasal spray Place 1 spray into both nostrils daily as needed for allergies or rhinitis.   Yes [provider]  losartan (COZAAR) 25 MG tablet TAKE 1 TABLET BY MOUTH DAILY 02/19/22  Yes Elouise Munroe, MD  Magnesium 200 MG TABS Take by mouth.   Yes [provider]  metoprolol tartrate (LOPRESSOR) 25 MG tablet Take 0.5 tablets (12.5 mg total) by mouth 2 (two) times daily. 01/02/22  Yes Freada Bergeron, MD  Multiple Vitamin (MULTIVITAMIN WITH MINERALS) TABS tablet Take 1 tablet by mouth daily.   Yes [provider]  pantoprazole (PROTONIX) 40 MG tablet Take 40 mg by mouth daily. 02/25/22  Yes [provider]  Pyridoxine HCl (VITAMIN B6 PO) Take 1 tablet by mouth daily.   Yes [provider]  rosuvastatin (CRESTOR) 5 MG tablet Take 5 mg by mouth at bedtime.  02/12/19  Yes [provider]  Vitamin D-Vitamin K (VITAMIN K2-VITAMIN D3 PO) Take 1 tablet by mouth daily.   Yes [provider]    Blood pressure 104/63, pulse (!) 54,  temperature 98.1 F (36.7 C), temperature source Oral, resp. rate 13, SpO2 94 %. Physical Exam: General: pleasant, WD/WN male who is laying in bed in NAD HEENT: head is normocephalic, atraumatic.  Sclera are noninjected.  Pupils equal and round.  Ears and nose without any masses or lesions.  Mouth is pink and moist. Dentition fair Heart: regular, rate, and rhythm.  Normal s1,s2. No obvious murmurs, gallops, or rubs noted.  Palpable radial and pedal pulses bilaterally  Lungs: CTAB, no wheezes, rhonchi, or rales noted.  Respiratory effort nonlabored Abd: soft, but still distended, much less tender, +BS, no masses, hernias, or organomegaly MS: no BUE/BLE edema, calves soft and nontender Skin: warm and dry with no masses, lesions, or rashes Psych: A&Ox4 with an appropriate affect Neuro: MAEs, no gross motor or sensory deficits BUE/BLE  Results for orders placed or performed during the hospital encounter of 04/18/22 (from the past 48 hour(s))  Lipase, blood     Status: None   Collection Time: 04/18/22 10:44 PM  Result Value Ref Range   Lipase 36 11 - 51 U/L    Comment: Performed at [facility], [place] [address], [place], [place] [address]  Comprehensive metabolic panel     Status: Abnormal   Collection Time: 04/18/22 10:44 PM  Result Value Ref Range   Sodium 139 135 - 145 mmol/L   Potassium 4.0 3.5 - 5.1 mmol/L   Chloride 103 98 - 111 mmol/L   CO2 26 22 - 32 mmol/L   Glucose, Bld 114 (H) 70 - 99 mg/dL    Comment: Glucose reference range applies only to samples taken after fasting for at least 8 hours.   BUN 23 8 - 23 mg/dL   Creatinine, Ser 1.22 0.61 - 1.24 mg/dL   Calcium 9.2 8.9 - 10.3 mg/dL   Total Protein 6.5 6.5 - 8.1 g/dL   Albumin 4.1 3.5 - 5.0 g/dL   AST 21 15 - 41 U/L   ALT 21 0 - 44 U/L   Alkaline Phosphatase 47 38 - 126 U/L   Total Bilirubin 1.2 0.3 - 1.2 mg/dL   GFR, Estimated >60 >60 mL/min    Comment: (NOTE) Calculated using the CKD-EPI Creatinine Equation  (2021)    Anion gap 10 5 - 15    Comment: Performed at [facility] [address], [place] [address]  CBC     Status: None   Collection Time: 04/18/22 10:44 PM  Result Value Ref Range   WBC 8.1 4.0 - 10.5 K/uL   RBC 4.86 4.22 - 5.81 MIL/uL   Hemoglobin 15.1 13.0 - 17.0 g/dL   HCT 44.4 39.0 - 52.0 %   MCV 91.4 80.0 - 100.0 fL   MCH 31.1 26.0 - 34.0 pg   MCHC 34.0 30.0 - 36.0 g/dL   RDW 12.4 11.5 - 15.5 %   Platelets 217 150 - 400 K/uL   nRBC 0.0 0.0 - 0.2 %    Comment: Performed at [facility], [place] [address], [place], [place] [address]  Urinalysis, Routine w reflex microscopic Urine, Clean Catch     Status: None   Collection Time: 04/18/22 11:11 PM  Result Value Ref Range   Color, Urine YELLOW YELLOW   APPearance CLEAR CLEAR   Specific Gravity, Urine 1.023 1.005 - 1.030   pH 6.0 5.0 - 8.0   Glucose, UA NEGATIVE NEGATIVE mg/dL   Hgb urine dipstick NEGATIVE NEGATIVE   Bilirubin Urine NEGATIVE NEGATIVE   Ketones, ur NEGATIVE NEGATIVE mg/dL   Protein, ur NEGATIVE NEGATIVE mg/dL   Nitrite NEGATIVE NEGATIVE   Leukocytes,Ua NEGATIVE NEGATIVE    Comment: Performed at [facility] [address], [place], [place] [address]   CT ABDOMEN PELVIS W CONTRAST  Result Date: 04/19/2022 CLINICAL DATA:  Left upper quadrant pain. EXAM: CT ABDOMEN AND PELVIS WITH CONTRAST TECHNIQUE: Multidetector CT imaging of the abdomen and pelvis was performed using the standard protocol following bolus administration of intravenous contrast. RADIATION DOSE REDUCTION: This exam was performed according to the departmental dose-optimization program which includes automated exposure control, adjustment of the mA and/or kV according to patient size and/or use of iterative reconstruction technique. CONTRAST:  173m OMNIPAQUE IOHEXOL 300 MG/ML  SOLN COMPARISON:  12/01/2014 FINDINGS: Lower chest: Minimal bibasilar dependent atelectasis. Calcified  plaque over the right coronary artery.  Hepatobiliary: Liver, gallbladder and biliary tree are normal. Pancreas: 1.1 cm somewhat heterogeneous hypodense focus over the inferior aspect of the pancreatic head which is indeterminate. Spleen: Unremarkable. Adrenals/Urinary Tract: Adrenal glands are normal. Kidneys are normal in size without hydronephrosis, nephrolithiasis or focal mass. Ureters and bladder are normal. Stomach/Bowel: Mild ill-defined wall thickening of the visualized distal esophagus. Mild distention of the stomach predominately with fluid. There are multiple dilated small bowel loops beginning over the proximal jejunum and extending distally measuring up to 4.1 cm in diameter. There is a segment of small bowel in the right mid to lower abdomen which is normal in caliber, although has enhancing wall thickening. Small bowel is dilated proximal and distal to this thick-walled segment. Mild fecalization within several of the dilated small bowel loops. Possible transition point over the right anterior mid abdomen. Terminal ileum and associated distal ileum is within normal. Appendix is normal. Colon is decompressed from the hepatic flexure distally. No free peritoneal air. Vascular/Lymphatic: Minimal calcified plaque over the abdominal aorta which is normal in caliber. Few small mesenteric lymph nodes present. Reproductive: Normal. Other: No significant free fluid. Musculoskeletal: Degenerative change of the spine and hips. IMPRESSION: 1. Findings suggesting early/partial small bowel obstruction without definite transition point. Segment of small bowel over the right abdomen with thickened enhancing wall. Findings may be due to intrinsic small bowel regional enteritis of infectious or inflammatory nature with secondary early/partial obstruction with transition point suggested over the anterior right mid abdomen. 2. 1.1 cm heterogeneous hypodense focus over the inferior aspect of the pancreatic head which is indeterminate. Recommend further  evaluation with MRI with and without contrast on an elective basis. 3. Aortic atherosclerosis. Minimal atherosclerotic coronary artery disease. Aortic Atherosclerosis (ICD10-I70.0). Electronically Signed   By: Marin Olp M.D.   On: 04/19/2022 09:09    Anti-infectives (From admission, onward)    None        Assessment/Plan Partial SBO vs gastroenteritis - prior h/o inguinal hernia repair - CT scan today shows early/partial small bowel obstruction without definite transition point, segment of small bowel over the right abdomen with thickened enhancing wall possibly due to intrinsic small bowel regional enteritis of infectious or inflammatory nature with secondary early/partial obstruction with transition point suggested over the anterior right mid abdomen - WBC WNL, VSS. No indication for acute surgical intervention - Recommend NG tube for decompression then starting SBO protocol. We will follow.  ID - none VTE - lovenox FEN - IVF, NPO/NGT to LIWS Foley - none  HTN BPH PUD GERD Pancreatic head hypodensity - incidental finding on CT, needs further work up as outpatient  I reviewed ED provider notes, last 24 h vitals and pain scores, last 48 h intake and output, last 24 h labs and trends, and last 24 h imaging results.   Henreitta Cea, [facility] Surgery 04/19/2022, 11:56 AM Please see Amion for pager number during day hours 7:00am-4:30pm

## 2022-04-20 ENCOUNTER — Inpatient Hospital Stay (HOSPITAL_COMMUNITY): Payer: Medicare Other

## 2022-04-20 ENCOUNTER — Encounter (HOSPITAL_COMMUNITY): Payer: Self-pay | Admitting: Internal Medicine

## 2022-04-20 DIAGNOSIS — K56600 Partial intestinal obstruction, unspecified as to cause: Secondary | ICD-10-CM | POA: Diagnosis not present

## 2022-04-20 LAB — BASIC METABOLIC PANEL
Anion gap: 6 (ref 5–15)
BUN: 13 mg/dL (ref 8–23)
CO2: 24 mmol/L (ref 22–32)
Calcium: 8.3 mg/dL — ABNORMAL LOW (ref 8.9–10.3)
Chloride: 110 mmol/L (ref 98–111)
Creatinine, Ser: 1.12 mg/dL (ref 0.61–1.24)
GFR, Estimated: >60 mL/min (ref >60)
Glucose, Bld: 93 mg/dL (ref 70–99)
Potassium: 4 mmol/L (ref 3.5–5.1)
Sodium: 140 mmol/L (ref 135–145)

## 2022-04-20 LAB — CBC
HCT: 40.9 % (ref 39.0–52.0)
Hemoglobin: 13.5 g/dL (ref 13.0–17.0)
MCH: 30.9 pg (ref 26.0–34.0)
MCHC: 33 g/dL (ref 30.0–36.0)
MCV: 93.6 fL (ref 80.0–100.0)
Platelets: 166 10*3/uL (ref 150–400)
RBC: 4.37 MIL/uL (ref 4.22–5.81)
RDW: 12.6 % (ref 11.5–15.5)
WBC: 4.7 10*3/uL (ref 4.0–10.5)
nRBC: 0 % (ref 0.0–0.2)

## 2022-04-20 LAB — TSH: TSH: 3.309 u[IU]/mL (ref 0.350–4.500)

## 2022-04-20 MED ORDER — LIDOCAINE VISCOUS HCL 2 % MT SOLN
15.0000 mL | Freq: Four times a day (QID) | OROMUCOSAL | Status: DC | PRN
  Administered 2022-04-20: 15 mL via OROMUCOSAL
  Filled 2022-04-20 (×2): qty 15

## 2022-04-20 NOTE — Progress Notes (Addendum)
Subjective: CC: No abdominal pain or nausea. NGT output 150cc/24 hours. Passing flatus. +BM/diarrhea. NGT is causing discomfort.   Objective: Vital signs in last 24 hours: Temp:  [97.7 F (36.5 C)-98.7 F (37.1 C)] 98 F (36.7 C) (08/19 0400) Pulse Rate:  [49-65] 61 (08/19 0848) Resp:  [14-21] 18 (08/19 0848) BP: (104-124)/(58-72) 120/62 (08/19 0848) SpO2:  [95 %-98 %] 95 % (08/19 0848) Weight:  [70.3 kg] 70.3 kg (08/18 1524)    Intake/Output from previous day: 08/18 0701 - 08/19 0700 In: 1283.5 [I.V.:1283.5] Out: 150 [Emesis/NG output:150] Intake/Output this shift: No intake/output data recorded.  PE: Gen:  Alert, NAD, pleasant Abd: Soft, ND, NT, +BS. NGT output not bilious  Lab Results:  Recent Labs    04/18/22 2244 04/20/22 0228  WBC 8.1 4.7  HGB 15.1 13.5  HCT 44.4 40.9  PLT 217 166   BMET Recent Labs    04/18/22 2244 04/20/22 0228  NA 139 140  K 4.0 4.0  CL 103 110  CO2 26 24  GLUCOSE 114* 93  BUN 23 13  CREATININE 1.22 1.12  CALCIUM 9.2 8.3*   PT/INR No results for input(s): "LABPROT", "INR" in the last 72 hours. CMP     Component Value Date/Time   NA 140 04/20/2022 0228   NA 141 02/24/2019 0931   K 4.0 04/20/2022 0228   CL 110 04/20/2022 0228   CO2 24 04/20/2022 0228   GLUCOSE 93 04/20/2022 0228   BUN 13 04/20/2022 0228   BUN 18 02/24/2019 0931   CREATININE 1.12 04/20/2022 0228   CALCIUM 8.3 (L) 04/20/2022 0228   PROT 6.5 04/18/2022 2244   PROT 6.4 02/24/2019 0931   ALBUMIN 4.1 04/18/2022 2244   ALBUMIN 4.8 02/24/2019 0931   AST 21 04/18/2022 2244   ALT 21 04/18/2022 2244   ALKPHOS 47 04/18/2022 2244   BILITOT 1.2 04/18/2022 2244   BILITOT 1.5 (H) 02/24/2019 0931   GFRNONAA >60 04/20/2022 0228   GFRAA >60 05/22/2020 0226   Lipase     Component Value Date/Time   LIPASE 36 04/18/2022 2244    Studies/Results: DG Abd Portable 1V  Result Date: 04/20/2022 CLINICAL DATA:  66 year old male with diarrhea. Questionable  small-bowel obstruction on CT yesterday. EXAM: PORTABLE ABDOMEN - 1 VIEW COMPARISON:  1823 and earlier. FINDINGS: Portable AP supine view at 0946 hours. Residual oral contrast throughout the large bowel and rectum. No dilated small bowel now. Enteric tube remains in place, side hole is at or near the GEJ. Negative lung bases. No acute osseous abnormality identified. IMPRESSION: 1. Non obstructed bowel gas pattern. Residual oral contrast throughout the colon. 2. Enteric tube side hole at or near the GEJ, advance 3 cm to ensure side hole placement within the stomach. Electronically Signed   By: Genevie Ann M.D.   On: 04/20/2022 10:02   DG Abd Portable 1V-Small Bowel Obstruction Protocol-initial, 8 hr delay  Result Date: 04/20/2022 CLINICAL DATA:  Small-bowel obstruction, 8 hour delay. EXAM: PORTABLE ABDOMEN - 1 VIEW COMPARISON:  04/19/2022. FINDINGS: There is distended loop of small bowel in the mid abdomen measuring 3.2 cm in diameter. Contrast is present in the colon. An enteric tube terminates in the stomach. No renal calculus is seen. IMPRESSION: Distended loop of small bowel in the upper abdomen measuring 3.2 cm in diameter. Oral contrast is present in the colon. Findings may reflect enteritis, ileus or partial small bowel obstruction. Electronically Signed   By: Regan Rakers.D.  On: 04/20/2022 00:12   DG Abd Portable 1 View  Result Date: 04/19/2022 CLINICAL DATA:  NG tube placement for small-bowel study. EXAM: PORTABLE ABDOMEN - 1 VIEW COMPARISON:  Radiograph earlier today. FINDINGS: Tip of the enteric tube is below the diaphragm in the stomach, the side port is likely in the stomach although obscured by in tear contrast. There is enteric contrast within the enteric tube, stomach, proximal small bowel. IMPRESSION: Tip of the enteric tube below the diaphragm in the stomach, side-port likely in the stomach although obscured by enteric contrast. Electronically Signed   By: Keith Rake M.D.   On:  04/19/2022 16:31   DG Abd Portable 1V-Small Bowel Protocol-Position Verification  Result Date: 04/19/2022 CLINICAL DATA:  NG tube placement, abdominal pain, nausea, diarrhea for 2 days. EXAM: PORTABLE ABDOMEN - 1 VIEW COMPARISON:  None Available. FINDINGS: Nasogastric tube in place with tip just below the level of the gastroesophageal junction. Visualized portions of the lungs are clear. IMPRESSION: Nasogastric tube in place with tip just below the level of the gastroesophageal junction. Proximal side holes are at the level of the distal esophagus. Recommend advancing approximately 10 cm for more optimal radiographic positioning. Electronically Signed   By: Franki Cabot M.D.   On: 04/19/2022 14:57   CT ABDOMEN PELVIS W CONTRAST  Result Date: 04/19/2022 CLINICAL DATA:  Left upper quadrant pain. EXAM: CT ABDOMEN AND PELVIS WITH CONTRAST TECHNIQUE: Multidetector CT imaging of the abdomen and pelvis was performed using the standard protocol following bolus administration of intravenous contrast. RADIATION DOSE REDUCTION: This exam was performed according to the departmental dose-optimization program which includes automated exposure control, adjustment of the mA and/or kV according to patient size and/or use of iterative reconstruction technique. CONTRAST:  141m OMNIPAQUE IOHEXOL 300 MG/ML  SOLN COMPARISON:  12/01/2014 FINDINGS: Lower chest: Minimal bibasilar dependent atelectasis. Calcified plaque over the right coronary artery. Hepatobiliary: Liver, gallbladder and biliary tree are normal. Pancreas: 1.1 cm somewhat heterogeneous hypodense focus over the inferior aspect of the pancreatic head which is indeterminate. Spleen: Unremarkable. Adrenals/Urinary Tract: Adrenal glands are normal. Kidneys are normal in size without hydronephrosis, nephrolithiasis or focal mass. Ureters and bladder are normal. Stomach/Bowel: Mild ill-defined wall thickening of the visualized distal esophagus. Mild distention of the  stomach predominately with fluid. There are multiple dilated small bowel loops beginning over the proximal jejunum and extending distally measuring up to 4.1 cm in diameter. There is a segment of small bowel in the right mid to lower abdomen which is normal in caliber, although has enhancing wall thickening. Small bowel is dilated proximal and distal to this thick-walled segment. Mild fecalization within several of the dilated small bowel loops. Possible transition point over the right anterior mid abdomen. Terminal ileum and associated distal ileum is within normal. Appendix is normal. Colon is decompressed from the hepatic flexure distally. No free peritoneal air. Vascular/Lymphatic: Minimal calcified plaque over the abdominal aorta which is normal in caliber. Few small mesenteric lymph nodes present. Reproductive: Normal. Other: No significant free fluid. Musculoskeletal: Degenerative change of the spine and hips. IMPRESSION: 1. Findings suggesting early/partial small bowel obstruction without definite transition point. Segment of small bowel over the right abdomen with thickened enhancing wall. Findings may be due to intrinsic small bowel regional enteritis of infectious or inflammatory nature with secondary early/partial obstruction with transition point suggested over the anterior right mid abdomen. 2. 1.1 cm heterogeneous hypodense focus over the inferior aspect of the pancreatic head which is indeterminate. Recommend further evaluation  with MRI with and without contrast on an elective basis. 3. Aortic atherosclerosis. Minimal atherosclerotic coronary artery disease. Aortic Atherosclerosis (ICD10-I70.0). Electronically Signed   By: Marin Olp M.D.   On: 04/19/2022 09:09    Anti-infectives: Anti-infectives (From admission, onward)    None        Assessment/Plan Partial SBO vs gastroenteritis - prior h/o inguinal hernia repair - CT scan today shows early/partial small bowel obstruction without  definite transition point, segment of small bowel over the right abdomen with thickened enhancing wall possibly due to intrinsic small bowel regional enteritis of infectious or inflammatory nature with secondary early/partial obstruction with transition point suggested over the anterior right mid abdomen - Clinically and radiographically improving. Contrast in colon on xray this am and nonobstructed bowel gas pattern. No abdominal pain or nausea. Reports ROBF. NGT output non-bilious. He is NT on exam. Can d/c NGT and ADAT.    ID - none. Afebrile. WBC wnl. VTE - lovenox FEN - D/c NGT. ADAT Foley - none   - Per [facility] -  HTN BPH PUD GERD Pancreatic head hypodensity - incidental finding on CT, needs further work up as outpatient   LOS: 1 day    Jillyn Ledger , [facility] Surgery 04/20/2022, 11:23 AM Please see Amion for pager number during day hours 7:00am-4:30pm

## 2022-04-20 NOTE — Plan of Care (Signed)

## 2022-04-20 NOTE — Progress Notes (Signed)
PROGRESS NOTE                                                                                                                                                                                                             Patient Demographics:    Eric Rhodes, is a 66 y.o. male, DOB - Apr 25, 1956, BSJ:[id number]  Outpatient Primary MD for the patient is Kathyrn Lass, MD    LOS - 1  Admit date - 04/18/2022    Chief Complaint  Patient presents with   Abdominal Pain   Nausea       Brief Narrative (HPI from H&P)  66 y.o. male with medical history significant of hypertension, BPH, PUD, GERD, and prior inguinal hernia repair in 2013 who present with complaints of abdominal pain starting last night around 7:30 PM about an hour after eating a burrito.  His work-up was consistent with gastroenteritis along with possible small bowel obstruction, seen by general surgery NG tube placed and admitted to the hospital.   Subjective:    [name] today has, No headache, No chest pain, No abdominal pain - No Nausea, No new weakness tingling or numbness, no SOB.  Having bowel movements and passing flatus.   Assessment  & Plan :   Gastroenteritis likely related to food poisoning, questionable small bowel obstruction.  He is passing flatus, having bowel movements, diarrhea is improved, clinically does not look like small bowel obstruction, general surgery following, will defer removal of NG tube and resumption of diet to general surgery.  C. difficile panel negative, GI pathogen panel still not available.  Continue to monitor with supportive care overall appears stable with IV fluids.   Asymptomatic bradycardia Heart rates noted to be 49-65.  Blood pressures currently maintained. Continue to monitor, check TSH.   Essential hypertension - PO Meds on hold, as needed IV hydralazine in place.   Lesion of pancreas - a 1.1 cm heterogeneous  hypodensity over the inferior aspect of the pancreatic head which was indeterminate on CT. Consider obtaining MRI of the abdomen pelvis with and without contrast   Anxiety - Home medication regimen includes clonazepam 0.5 mg 2 times daily as needed for anxiety. Ativan 0.5 mg IV as needed for anxiety   Hyperlipidemia -Hold Crestor.  Resume when medically appropriate   GERD -Protonix 40 mg IV twice daily  Condition - Fair  Family Communication  : None   Code Status :  Full  Consults  :  CCS  PUD Prophylaxis : PPI   Procedures  :     CT - 1. Findings suggesting early/partial small bowel obstruction without definite transition point. Segment of small bowel over the right abdomen with thickened enhancing wall. Findings may be due to intrinsic small bowel regional enteritis of infectious or inflammatory nature with secondary early/partial obstruction with transition point suggested over the anterior right mid abdomen. 2. 1.1 cm heterogeneous hypodense focus over the inferior aspect of the pancreatic head which is indeterminate. Recommend further evaluation with MRI with and without contrast on an elective basis. 3. Aortic atherosclerosis. Minimal atherosclerotic coronary artery disease. Aortic Atherosclerosis       Disposition Plan  :    Status is: Inpatient  DVT Prophylaxis  :    enoxaparin (LOVENOX) injection 40 mg Start: 04/19/22 1030   Lab Results  Component Value Date   PLT 166 04/20/2022    Diet :  Diet Order             Diet NPO time specified  Diet effective now                    Inpatient Medications  Scheduled Meds:  enoxaparin (LOVENOX) injection  40 mg Subcutaneous Daily   pantoprazole (PROTONIX) IV  40 mg Intravenous Q12H   sodium chloride flush  3 mL Intravenous Q12H   Continuous Infusions:  sodium chloride 75 mL (04/19/22 2250)   PRN Meds:.acetaminophen **OR** acetaminophen, albuterol, hydrALAZINE, LORazepam, morphine injection, ondansetron  **OR** ondansetron (ZOFRAN) IV  Antibiotics  :    Anti-infectives (From admission, onward)    None        Time Spent in minutes  30   [name] M.D on 04/20/2022 at 9:30 AM  To page go to [email or web address]   [facility] -  Office  [phone]  See all Orders from today for further details    Objective:   Vitals:   04/19/22 2124 04/20/22 0000 04/20/22 0400 04/20/22 0848  BP: 115/66 (!) 104/58 107/64   Pulse: 63 61 60 61  Resp: '14 15 16 18  '$ Temp: 97.9 F (36.6 C) 98.1 F (36.7 C) 98 F (36.7 C)   TempSrc: Oral Oral Oral   SpO2: 98% 96% 98% 95%  Weight:      Height:        Wt Readings from Last 3 Encounters:  04/19/22 70.3 kg  02/04/22 70.8 kg  02/01/21 72.1 kg     Intake/Output Summary (Last 24 hours) at 04/20/2022 0930 Last data filed at 04/20/2022 6301 Gross per 24 hour  Intake 1283.5 ml  Output 150 ml  Net 1133.5 ml     Physical Exam  Awake Alert, No new F.N deficits, NG in place, anxious affect [place].AT,PERRAL Supple Neck, No JVD,   Symmetrical Chest wall movement, Good air movement bilaterally, CTAB RRR,No Gallops,Rubs or new Murmurs,  +ve B.Sounds, Abd Soft, No tenderness,   No Cyanosis, Clubbing or edema      Data Review:    CBC Recent Labs  Lab 04/18/22 2244 04/20/22 0228  WBC 8.1 4.7  HGB 15.1 13.5  HCT 44.4 40.9  PLT 217 166  MCV 91.4 93.6  MCH 31.1 30.9  MCHC 34.0 33.0  RDW 12.4 12.6    Electrolytes Recent Labs  Lab 04/18/22 2244 04/20/22 0228  NA 139 140  K 4.0 4.0  CL 103 110  CO2 26 24  GLUCOSE 114* 93  BUN 23 13  CREATININE 1.22 1.12  CALCIUM 9.2 8.3*  AST 21  --   ALT 21  --   ALKPHOS 47  --   BILITOT 1.2  --   ALBUMIN 4.1  --     ------------------------------------------------------------------------------------------------------------------ No results for input(s): "CHOL", "HDL", "LDLCALC", "TRIG", "CHOLHDL", "LDLDIRECT" in the last 72 hours.  Lab Results  Component Value Date   HGBA1C  5.4 06/17/2017    Micro Results No results found for this or any previous visit (from the past 240 hour(s)).  Radiology Reports DG Abd Portable 1V-Small Bowel Obstruction Protocol-initial, 8 hr delay  Result Date: 04/20/2022 CLINICAL DATA:  Small-bowel obstruction, 8 hour delay. EXAM: PORTABLE ABDOMEN - 1 VIEW COMPARISON:  04/19/2022. FINDINGS: There is distended loop of small bowel in the mid abdomen measuring 3.2 cm in diameter. Contrast is present in the colon. An enteric tube terminates in the stomach. No renal calculus is seen. IMPRESSION: Distended loop of small bowel in the upper abdomen measuring 3.2 cm in diameter. Oral contrast is present in the colon. Findings may reflect enteritis, ileus or partial small bowel obstruction. Electronically Signed   By: Brett Fairy M.D.   On: 04/20/2022 00:12   DG Abd Portable 1 View  Result Date: 04/19/2022 CLINICAL DATA:  NG tube placement for small-bowel study. EXAM: PORTABLE ABDOMEN - 1 VIEW COMPARISON:  Radiograph earlier today. FINDINGS: Tip of the enteric tube is below the diaphragm in the stomach, the side port is likely in the stomach although obscured by in tear contrast. There is enteric contrast within the enteric tube, stomach, proximal small bowel. IMPRESSION: Tip of the enteric tube below the diaphragm in the stomach, side-port likely in the stomach although obscured by enteric contrast. Electronically Signed   By: Keith Rake M.D.   On: 04/19/2022 16:31   DG Abd Portable 1V-Small Bowel Protocol-Position Verification  Result Date: 04/19/2022 CLINICAL DATA:  NG tube placement, abdominal pain, nausea, diarrhea for 2 days. EXAM: PORTABLE ABDOMEN - 1 VIEW COMPARISON:  None Available. FINDINGS: Nasogastric tube in place with tip just below the level of the gastroesophageal junction. Visualized portions of the lungs are clear. IMPRESSION: Nasogastric tube in place with tip just below the level of the gastroesophageal junction. Proximal side  holes are at the level of the distal esophagus. Recommend advancing approximately 10 cm for more optimal radiographic positioning. Electronically Signed   By: Franki Cabot M.D.   On: 04/19/2022 14:57   CT ABDOMEN PELVIS W CONTRAST  Result Date: 04/19/2022 CLINICAL DATA:  Left upper quadrant pain. EXAM: CT ABDOMEN AND PELVIS WITH CONTRAST TECHNIQUE: Multidetector CT imaging of the abdomen and pelvis was performed using the standard protocol following bolus administration of intravenous contrast. RADIATION DOSE REDUCTION: This exam was performed according to the departmental dose-optimization program which includes automated exposure control, adjustment of the mA and/or kV according to patient size and/or use of iterative reconstruction technique. CONTRAST:  168m OMNIPAQUE IOHEXOL 300 MG/ML  SOLN COMPARISON:  12/01/2014 FINDINGS: Lower chest: Minimal bibasilar dependent atelectasis. Calcified plaque over the right coronary artery. Hepatobiliary: Liver, gallbladder and biliary tree are normal. Pancreas: 1.1 cm somewhat heterogeneous hypodense focus over the inferior aspect of the pancreatic head which is indeterminate. Spleen: Unremarkable. Adrenals/Urinary Tract: Adrenal glands are normal. Kidneys are normal in size without hydronephrosis, nephrolithiasis or focal mass. Ureters and bladder are normal. Stomach/Bowel: Mild ill-defined wall thickening of the visualized distal esophagus.  Mild distention of the stomach predominately with fluid. There are multiple dilated small bowel loops beginning over the proximal jejunum and extending distally measuring up to 4.1 cm in diameter. There is a segment of small bowel in the right mid to lower abdomen which is normal in caliber, although has enhancing wall thickening. Small bowel is dilated proximal and distal to this thick-walled segment. Mild fecalization within several of the dilated small bowel loops. Possible transition point over the right anterior mid abdomen.  Terminal ileum and associated distal ileum is within normal. Appendix is normal. Colon is decompressed from the hepatic flexure distally. No free peritoneal air. Vascular/Lymphatic: Minimal calcified plaque over the abdominal aorta which is normal in caliber. Few small mesenteric lymph nodes present. Reproductive: Normal. Other: No significant free fluid. Musculoskeletal: Degenerative change of the spine and hips. IMPRESSION: 1. Findings suggesting early/partial small bowel obstruction without definite transition point. Segment of small bowel over the right abdomen with thickened enhancing wall. Findings may be due to intrinsic small bowel regional enteritis of infectious or inflammatory nature with secondary early/partial obstruction with transition point suggested over the anterior right mid abdomen. 2. 1.1 cm heterogeneous hypodense focus over the inferior aspect of the pancreatic head which is indeterminate. Recommend further evaluation with MRI with and without contrast on an elective basis. 3. Aortic atherosclerosis. Minimal atherosclerotic coronary artery disease. Aortic Atherosclerosis (ICD10-I70.0). Electronically Signed   By: Marin Olp M.D.   On: 04/19/2022 09:09

## 2022-04-20 NOTE — Evaluation (Signed)
Physical Therapy Evaluation Patient Details Name: Eric Rhodes MRN: [id number] DOB: 1955-11-24 Today's Date: 04/20/2022  History of Present Illness  66 y.o. male who present to ED 8/17 with complaints of 9/10 abdominal pain starting about an hour after eating a burrito. CT scan of the abdomen pelvis noted findings suggestive of early/partial small bowel obstruction. HR  49-85. Admitted for treatment. NG tube placed.  PMH: hypertension, BPH, PUD, GERD, and prior inguinal hernia repair in 2013  Clinical Impression  Patient evaluated by Physical Therapy with no further acute PT needs identified. All education has been completed and the patient has no further questions. Pt is close to baseline level of function and does not have any follow-up Physical Therapy or equipment needs. PT is signing off. Thank you for this referral.        Recommendations for follow up therapy are one component of a multi-disciplinary discharge planning process, led by the attending physician.  Recommendations may be updated based on patient status, additional functional criteria and insurance authorization.  Follow Up Recommendations No PT follow up            Equipment Recommendations None recommended by PT     Functional Status Assessment Patient has not had a recent decline in their functional status     Precautions / Restrictions Precautions Precautions: None Restrictions Weight Bearing Restrictions: No      Mobility  Bed Mobility Overal bed mobility: Independent                  Transfers Overall transfer level: Independent                      Ambulation/Gait Ambulation/Gait assistance: Independent Gait Distance (Feet): 1500 Feet Assistive device: None Gait Pattern/deviations: WFL(Within Functional Limits), Step-through pattern Gait velocity: WFL Gait velocity interpretation: >4.37 ft/sec, indicative of normal walking speed   General Gait Details: strong, steady  gait     Balance Overall balance assessment: Independent                                           Pertinent Vitals/Pain Pain Assessment Pain Assessment: No/denies pain    Home Living Family/patient expects to be discharged to:: Private residence Living Arrangements: Spouse/significant other Available Help at Discharge: Family Type of Home: House Home Access: Stairs to enter;Elevator Entrance Stairs-Rails: None Entrance Stairs-Number of Steps: 1 Alternate Level Stairs-Number of Steps: 15 Home Layout: Multi-level;Other (Comment) (elevator) Home Equipment: None;Hand held shower head;Shower seat      Prior Function Prior Level of Function : Independent/Modified Independent                        Extremity/Trunk Assessment   Upper Extremity Assessment Upper Extremity Assessment: Overall WFL for tasks assessed    Lower Extremity Assessment Lower Extremity Assessment: Overall WFL for tasks assessed    Cervical / Trunk Assessment Cervical / Trunk Assessment: Normal  Communication   Communication: No difficulties  Cognition Arousal/Alertness: Awake/alert Behavior During Therapy: WFL for tasks assessed/performed Overall Cognitive Status: Within Functional Limits for tasks assessed                                          General Comments General comments (skin integrity,  edema, etc.): VSS on RA     Assessment/Plan    PT Assessment Patient does not need any further PT services         PT Goals (Current goals can be found in the Care Plan section)  Acute Rehab PT Goals Patient Stated Goal: get back to the gym PT Goal Formulation: All assessment and education complete, DC therapy     AM-PAC PT "6 Clicks" Mobility  Outcome Measure Help needed turning from your back to your side while in a flat bed without using bedrails?: None Help needed moving from lying on your back to sitting on the side of a flat bed without using  bedrails?: None Help needed moving to and from a bed to a chair (including a wheelchair)?: None Help needed standing up from a chair using your arms (e.g., wheelchair or bedside chair)?: None Help needed to walk in hospital room?: None Help needed climbing 3-5 steps with a railing? : None 6 Click Score: 24    End of Session   Activity Tolerance: Patient tolerated treatment well Patient left: Other (comment) (at sink getting ready to wash up) Nurse Communication: Mobility status      Time: 1316-1340 PT Time Calculation (min) (ACUTE ONLY): 24 min   Charges:   PT Evaluation $PT Eval Low Complexity: 1 Low PT Treatments $Therapeutic Exercise: 8-22 mins        Bonnita Newby B. Migdalia Dk PT, DPT Acute Rehabilitation Services Please use secure chat or  Call Office [phone]   [place] 04/20/2022, 1:51 PM

## 2022-04-21 DIAGNOSIS — K56600 Partial intestinal obstruction, unspecified as to cause: Secondary | ICD-10-CM | POA: Diagnosis not present

## 2022-04-21 NOTE — Discharge Instructions (Addendum)
Follow with Primary MD Kathyrn Lass, MD in 7 days, get MRI of the abdomen pelvis with and without contrast in the next 7 to 10 days.  Get CBC, CMP, magnesium, abdominal x-ray -  checked next visit within 1 week by Primary MD    Activity: As tolerated with Full fall precautions use walker/cane & assistance as needed  Disposition Home    Diet: Heart Healthy   Special Instructions: If you have smoked or chewed Tobacco  in the last 2 yrs please stop smoking, stop any regular Alcohol  and or any Recreational drug use.  On your next visit with your primary care physician please Get Medicines reviewed and adjusted.  Please request your Prim.MD to go over all Hospital Tests and Procedure/Radiological results at the follow up, please get all Hospital records sent to your Prim MD by signing hospital release before you go home.  If you experience worsening of your admission symptoms, develop shortness of breath, life threatening emergency, suicidal or homicidal thoughts you must seek medical attention immediately by calling 911 or calling your MD immediately  if symptoms less severe.  You Must read complete instructions/literature along with all the possible adverse reactions/side effects for all the Medicines you take and that have been prescribed to you. Take any new Medicines after you have completely understood and accpet all the possible adverse reactions/side effects.

## 2022-04-21 NOTE — Discharge Summary (Signed)
Eric Rhodes FFM:[id number] DOB: [birth year] DOA: 04/18/2022  PCP: Kathyrn Lass, MD  Admit date: 04/18/2022  Discharge date: 04/21/2022  Admitted From: Home   Disposition:  Home   Recommendations for Outpatient Follow-up:   Follow up with PCP in 1-2 weeks  PCP Please obtain BMP/CBC, 2 view CXR in 1week,  (see Discharge instructions)   PCP Please follow up on the following pending results: Needs outpatient abdominal MRI to evaluate pancreatic density and 2 to 3 weeks.   Home Health: None   Equipment/Devices: None  Consultations: CCS Discharge Condition: Stable    CODE STATUS: Full    Diet Recommendation: Heart Healthy     Chief Complaint  Patient presents with   Abdominal Pain   Nausea     Brief history of present illness from the day of admission and additional interim summary    66 y.o. male with medical history significant of hypertension, BPH, PUD, GERD, and prior inguinal hernia repair in 2013 who present with complaints of abdominal pain starting last night around 7:30 PM about an hour after eating a burrito.  His work-up was consistent with gastroenteritis along with possible small bowel obstruction, seen by general surgery NG tube placed and admitted to the hospital.                                                                   Hospital Course    Gastroenteritis likely related to food poisoning, questionable small bowel obstruction.  Was all food poisoning causing severe gastroenteritis, he was treated conservatively, clinically no signs of SBO, tolerating soft diet, diarrhea much improved, no nausea vomiting or abdominal distention.  Symptom-free.  Case discussed with general surgery who had also seen the patient yesterday.  Stable for discharge home.  No fever or leukocytosis.  Could have  been foodborne viral gastroenteritis.   Asymptomatic resting bradycardia Heart rates noted to be 49-65.  Pressure stable with stable TSH.  Follow-up with PCP outpatient along with primary cardiologist.  Essential hypertension -continue home regimen.   Lesion of pancreas - a 1.1 cm heterogeneous hypodensity over the inferior aspect of the pancreatic head which was indeterminate on CT. Will request PCP to obtain MRI of the abdomen and pelvis with and without contrast in the next 2 to 3 weeks.   Anxiety - Home medication regimen continued.   Hyperlipidemia -continue home Crestor.   GERD continue home regimen.    Discharge diagnosis     Principal Problem:   Partial small bowel obstruction (HCC) Active Problems:   Asymptomatic bradycardia   Essential hypertension   Lesion of pancreas   Anxiety   Hyperlipidemia   GERD (gastroesophageal reflux disease)    Discharge instructions    Discharge Instructions     Diet - low sodium heart healthy  Complete by: As directed    Discharge instructions   Complete by: As directed    Follow with Primary MD Kathyrn Lass, MD in 7 days, get MRI of the abdomen pelvis with and without contrast in the next 7 to 10 days.  Get CBC, CMP, magnesium, abdominal x-ray -  checked next visit within 1 week by Primary MD    Activity: As tolerated with Full fall precautions use walker/cane & assistance as needed  Disposition Home    Diet: Heart Healthy   Special Instructions: If you have smoked or chewed Tobacco  in the last 2 yrs please stop smoking, stop any regular Alcohol  and or any Recreational drug use.  On your next visit with your primary care physician please Get Medicines reviewed and adjusted.  Please request your Prim.MD to go over all Hospital Tests and Procedure/Radiological results at the follow up, please get all Hospital records sent to your Prim MD by signing hospital release before you go home.  If you experience worsening of your  admission symptoms, develop shortness of breath, life threatening emergency, suicidal or homicidal thoughts you must seek medical attention immediately by calling 911 or calling your MD immediately  if symptoms less severe.  You Must read complete instructions/literature along with all the possible adverse reactions/side effects for all the Medicines you take and that have been prescribed to you. Take any new Medicines after you have completely understood and accpet all the possible adverse reactions/side effects.   Increase activity slowly   Complete by: As directed        Discharge Medications   Allergies as of 04/21/2022       Reactions   Augmentin [amoxicillin-pot Clavulanate] Other (See Comments)   Causes GI cramps   Bacitracin Other (See Comments)   Positive patch test   Balsam Other (See Comments)   Positive patch test   Benzyl Cinnamate Other (See Comments)   Positive patch test   Betadine [povidone Iodine] Other (See Comments)   Skin sensitive   Clavulanic Acid Other (See Comments)   GI Cramps   Doxycycline Hives   Gold Sodium Thiosulfate Other (See Comments)   Positive patch test   Lanolin Other (See Comments)   Positive patch test   Limonene Other (See Comments)   Positive patch test   Linalool Other (See Comments)   Positive patch test   Methylisothiazolinone Other (See Comments)   Positive patch test   Other Other (See Comments)   Cinnamic Alcohol-Positive patch test Cinnamic Aldehyde-Positive patch test Compositae Mix-Positive patch test Dimethylaminopropylamine-Positive patch test Disperse Dye Mix-Positive patch test Quaternium-15-Positive patch test Fragrance mix I-Positive patch test Fragrance mix II-Positive patch test   Tea Tree Oil Other (See Comments)   Positive patch test   Neosporin [neomycin-bacitracin Zn-polymyx] Rash   Contact dermatitis        Medication List     TAKE these medications    aspirin EC 81 MG tablet Take 81 mg by mouth  at bedtime. Swallow whole.   clonazePAM 0.5 MG tablet Commonly known as: KLONOPIN Take 0.5 mg by mouth 2 (two) times daily as needed.   fluticasone 50 MCG/ACT nasal spray Commonly known as: FLONASE Place 1 spray into both nostrils daily as needed for allergies or rhinitis.   losartan 25 MG tablet Commonly known as: COZAAR TAKE 1 TABLET BY MOUTH DAILY   Magnesium 200 MG Tabs Take by mouth.   metoprolol tartrate 25 MG tablet Commonly known as: LOPRESSOR Take 0.5  tablets (12.5 mg total) by mouth 2 (two) times daily.   multivitamin with minerals Tabs tablet Take 1 tablet by mouth daily.   pantoprazole 40 MG tablet Commonly known as: PROTONIX Take 40 mg by mouth daily.   Repatha SureClick 629 MG/ML Soaj Generic drug: Evolocumab INJECT THE CONTENTS OF 1 SYRINGE INTO THE SKIN ONCE EVERY 14 DAYS   rosuvastatin 5 MG tablet Commonly known as: CRESTOR Take 5 mg by mouth at bedtime.   VITAMIN B6 PO Take 1 tablet by mouth daily.   VITAMIN K2-VITAMIN D3 PO Take 1 tablet by mouth daily.         Follow-up Information     Kathyrn Lass, MD. Schedule an appointment as soon as possible for a visit in 1 week(s).   Specialty: Family Medicine Contact information: [place] [address] [phone]         Dorothy Spark, MD .   Specialty: Cardiology Contact information: [place] [address] [phone]                 Major procedures and Radiology Reports - PLEASE review detailed and final reports thoroughly  -        DG Abd Portable 1V  Result Date: 04/20/2022 CLINICAL DATA:  66 year old male with diarrhea. Questionable small-bowel obstruction on CT yesterday. EXAM: PORTABLE ABDOMEN - 1 VIEW COMPARISON:  1823 and earlier. FINDINGS: Portable AP supine view at 0946 hours. Residual oral contrast throughout the large bowel and rectum. No dilated small bowel now. Enteric tube remains in place, side hole is  at or near the GEJ. Negative lung bases. No acute osseous abnormality identified. IMPRESSION: 1. Non obstructed bowel gas pattern. Residual oral contrast throughout the colon. 2. Enteric tube side hole at or near the GEJ, advance 3 cm to ensure side hole placement within the stomach. Electronically Signed   By: Genevie Ann M.D.   On: 04/20/2022 10:02   DG Abd Portable 1V-Small Bowel Obstruction Protocol-initial, 8 hr delay  Result Date: 04/20/2022 CLINICAL DATA:  Small-bowel obstruction, 8 hour delay. EXAM: PORTABLE ABDOMEN - 1 VIEW COMPARISON:  04/19/2022. FINDINGS: There is distended loop of small bowel in the mid abdomen measuring 3.2 cm in diameter. Contrast is present in the colon. An enteric tube terminates in the stomach. No renal calculus is seen. IMPRESSION: Distended loop of small bowel in the upper abdomen measuring 3.2 cm in diameter. Oral contrast is present in the colon. Findings may reflect enteritis, ileus or partial small bowel obstruction. Electronically Signed   By: Brett Fairy M.D.   On: 04/20/2022 00:12   DG Abd Portable 1 View  Result Date: 04/19/2022 CLINICAL DATA:  NG tube placement for small-bowel study. EXAM: PORTABLE ABDOMEN - 1 VIEW COMPARISON:  Radiograph earlier today. FINDINGS: Tip of the enteric tube is below the diaphragm in the stomach, the side port is likely in the stomach although obscured by in tear contrast. There is enteric contrast within the enteric tube, stomach, proximal small bowel. IMPRESSION: Tip of the enteric tube below the diaphragm in the stomach, side-port likely in the stomach although obscured by enteric contrast. Electronically Signed   By: Keith Rake M.D.   On: 04/19/2022 16:31   DG Abd Portable 1V-Small Bowel Protocol-Position Verification  Result Date: 04/19/2022 CLINICAL DATA:  NG tube placement, abdominal pain, nausea, diarrhea for 2 days. EXAM: PORTABLE ABDOMEN - 1 VIEW COMPARISON:  None Available. FINDINGS: Nasogastric tube in place with  tip just below the level of the gastroesophageal junction. Visualized portions of the lungs are clear. IMPRESSION: Nasogastric tube in place with tip just below the level of the gastroesophageal junction. Proximal side holes are at the level of the distal esophagus. Recommend advancing approximately 10 cm for more optimal radiographic positioning. Electronically Signed   By: Franki Cabot M.D.   On: 04/19/2022 14:57   CT ABDOMEN PELVIS W CONTRAST  Result Date: 04/19/2022 CLINICAL DATA:  Left upper quadrant pain. EXAM: CT ABDOMEN AND PELVIS WITH CONTRAST TECHNIQUE: Multidetector CT imaging of the abdomen and pelvis was performed using the standard protocol following bolus administration of intravenous contrast. RADIATION DOSE REDUCTION: This exam was performed according to the departmental dose-optimization program which includes automated exposure control, adjustment of the mA and/or kV according to patient size and/or use of iterative reconstruction technique. CONTRAST:  154m OMNIPAQUE IOHEXOL 300 MG/ML  SOLN COMPARISON:  12/01/2014 FINDINGS: Lower chest: Minimal bibasilar dependent atelectasis. Calcified plaque over the right coronary artery. Hepatobiliary: Liver, gallbladder and biliary tree are normal. Pancreas: 1.1 cm somewhat heterogeneous hypodense focus over the inferior aspect of the pancreatic head which is indeterminate. Spleen: Unremarkable. Adrenals/Urinary Tract: Adrenal glands are normal. Kidneys are normal in size without hydronephrosis, nephrolithiasis or focal mass. Ureters and bladder are normal. Stomach/Bowel: Mild ill-defined wall thickening of the visualized distal esophagus. Mild distention of the stomach predominately with fluid. There are multiple dilated small bowel loops beginning over the proximal jejunum and extending distally measuring up to 4.1 cm in diameter. There is a segment of small bowel in the right mid to lower abdomen which is normal in caliber, although has enhancing  wall thickening. Small bowel is dilated proximal and distal to this thick-walled segment. Mild fecalization within several of the dilated small bowel loops. Possible transition point over the right anterior mid abdomen. Terminal ileum and associated distal ileum is within normal. Appendix is normal. Colon is decompressed from the hepatic flexure distally. No free peritoneal air. Vascular/Lymphatic: Minimal calcified plaque over the abdominal aorta which is normal in caliber. Few small mesenteric lymph nodes present. Reproductive: Normal. Other: No significant free fluid. Musculoskeletal: Degenerative change of the spine and hips. IMPRESSION: 1. Findings suggesting early/partial small bowel obstruction without definite transition point. Segment of small bowel over the right abdomen with thickened enhancing wall. Findings may be due to intrinsic small bowel regional enteritis of infectious or inflammatory nature with secondary early/partial obstruction with transition point suggested over the anterior right mid abdomen. 2. 1.1 cm heterogeneous hypodense focus over the inferior aspect of the pancreatic head which is indeterminate. Recommend further evaluation with MRI with and without contrast on an elective basis. 3. Aortic atherosclerosis. Minimal atherosclerotic coronary artery disease. Aortic Atherosclerosis (ICD10-I70.0). Electronically Signed   By: DMarin OlpM.D.   On: 04/19/2022 09:09    Micro Results    No results found for this or any previous visit (from the past 240 hour(s)).  Today   Subjective    JMell Guiatoday has no headache,no chest abdominal pain,no new weakness tingling or numbness, feels much better wants to go home today.    Objective   Blood pressure (!) 109/55, pulse 66, temperature 98.2 F (36.8 C), temperature source Oral, resp. rate 20, height '5\' 3"'$  (1.6 m), weight 70.3 kg, SpO2 92 %.  No intake or output data in the 24 hours ending 04/21/22 0945  Exam  Awake  Alert, No new F.N deficits,    [place].AT,PERRAL Supple Neck,  Symmetrical Chest wall movement, Good air movement bilaterally, CTAB RRR,No Gallops,   +ve B.Sounds, Abd Soft, Non tender,  No Cyanosis, Clubbing or edema    Data Review   Recent Labs  Lab 04/18/22 2244 04/20/22 0228  WBC 8.1 4.7  HGB 15.1 13.5  HCT 44.4 40.9  PLT 217 166  MCV 91.4 93.6  MCH 31.1 30.9  MCHC 34.0 33.0  RDW 12.4 12.6    Recent Labs  Lab 04/18/22 2244 04/19/22 1125 04/20/22 0228  NA 139  --  140  K 4.0  --  4.0  CL 103  --  110  CO2 26  --  24  GLUCOSE 114*  --  93  BUN 23  --  13  CREATININE 1.22  --  1.12  CALCIUM 9.2  --  8.3*  AST 21  --   --   ALT 21  --   --   ALKPHOS 47  --   --   BILITOT 1.2  --   --   ALBUMIN 4.1  --   --   TSH  --  3.309  --     Total Time in preparing paper work, data evaluation and todays exam - 1 minutes  [name] M.D on 04/21/2022 at 9:45 AM  [facility]

## 2022-04-22 ENCOUNTER — Telehealth: Payer: Self-pay | Admitting: Internal Medicine

## 2022-04-22 NOTE — Telephone Encounter (Signed)
Pt is a Dr. Margaretann Loveless pt.  Provider switch occurred on 3/13 (see telephone encounter with agreement).   Will send this to our NL triage to further assist the pt with his appt needs.

## 2022-04-22 NOTE — Telephone Encounter (Signed)
Will address at clinic visit.   Eric Chism S Maximos Zayas, NP  

## 2022-04-22 NOTE — Telephone Encounter (Signed)
Pt calling to schedule appt for a hospital F/U. Pt is scheduled but would like a nurse to callback regarding a sooner appt. Please advise

## 2022-04-22 NOTE — Telephone Encounter (Addendum)
Patient requested sooner appointment since ED visit (8/18-8/20). Patient went to ED and was DX with food poisoning and SBO. Stated he ate a burrito from [employer]. Also had PVCs. Appointment made with Vella Raring for 8/23.

## 2022-04-23 ENCOUNTER — Other Ambulatory Visit: Payer: Self-pay | Admitting: Physician Assistant

## 2022-04-23 DIAGNOSIS — K869 Disease of pancreas, unspecified: Secondary | ICD-10-CM

## 2022-04-24 ENCOUNTER — Encounter (HOSPITAL_BASED_OUTPATIENT_CLINIC_OR_DEPARTMENT_OTHER): Payer: Self-pay | Admitting: Family

## 2022-04-24 ENCOUNTER — Ambulatory Visit (INDEPENDENT_AMBULATORY_CARE_PROVIDER_SITE_OTHER): Payer: Medicare Other | Admitting: Family

## 2022-04-24 VITALS — BP 120/64 | HR 53 | Ht 63.0 in | Wt 158.0 lb

## 2022-04-24 DIAGNOSIS — E785 Hyperlipidemia, unspecified: Secondary | ICD-10-CM

## 2022-04-24 DIAGNOSIS — I2510 Atherosclerotic heart disease of native coronary artery without angina pectoris: Secondary | ICD-10-CM

## 2022-04-24 DIAGNOSIS — I10 Essential (primary) hypertension: Secondary | ICD-10-CM | POA: Diagnosis not present

## 2022-04-24 DIAGNOSIS — I493 Ventricular premature depolarization: Secondary | ICD-10-CM | POA: Diagnosis not present

## 2022-04-24 DIAGNOSIS — R002 Palpitations: Secondary | ICD-10-CM

## 2022-04-24 DIAGNOSIS — K869 Disease of pancreas, unspecified: Secondary | ICD-10-CM

## 2022-04-24 DIAGNOSIS — I491 Atrial premature depolarization: Secondary | ICD-10-CM

## 2022-04-24 NOTE — Progress Notes (Signed)
Office Visit    Patient Name: Musab Wingard Sanfilippo Date of Encounter: 04/24/2022  PCP:  Kathyrn Lass, [facility]  Cardiologist:  Elouise Munroe, MD  Advanced Practice Provider:  No care team member to display Electrophysiologist:  None      Chief Complaint    Malakai Schoenherr Joynt is a 66 y.o. male presents today for hospital follow-up, shortness of breath  Past Medical History    Past Medical History:  Diagnosis Date   BPH (benign prostatic hyperplasia)    Chronic abdominal pain    Chronic insomnia    GERD (gastroesophageal reflux disease)    diet controlled   H/O ETOH abuse    Hernia, inguinal, left    History of colonoscopy 2004   History of endoscopy 2004   Hypertension    Irritable bowel disease    Prostatitis    Past Surgical History:  Procedure Laterality Date   ANTERIOR CRUCIATE LIGAMENT REPAIR Left 1978   BUNIONECTOMY     COLON MEDOFF     COLONOSCOPY     HEMORRHOIDAL BANDING  10/2014   HERNIA REPAIR Left 11/2011   INGUINAL HERNIA REPAIR  11/07/2011   Procedure: HERNIA REPAIR INGUINAL ADULT;  Surgeon: Shann Medal, MD;  Location: [facility];  Service: General;  Laterality: Left;  Left Inguinal Herniorrhaphy with Mesh   IRRIGATION AND DEBRIDEMENT ELBOW Right 05/22/2020   Procedure: IRRIGATION AND DEBRIDEMENT RIGHT ELBOW;  Surgeon: Tania Ade, MD;  Location: [facility];  Service: Orthopedics;  Laterality: Right;   IRRIGATION AND DEBRIDEMENT ELBOW Right 09/18/2020   Procedure: IRRIGATION AND DEBRIDEMENT ELBOW WITH SUTURE REMOVAL;  Surgeon: Tania Ade, MD;  Location: [facility];  Service: Orthopedics;  Laterality: Right;   UPPER GASTROINTESTINAL ENDOSCOPY     WISDOM TOOTH EXTRACTION      Allergies  Allergies  Allergen Reactions   Augmentin [Amoxicillin-Pot Clavulanate] Other (See Comments)    Causes GI cramps   Bacitracin Other (See Comments)    Positive patch test   Balsam Other (See  Comments)    Positive patch test   Benzyl Cinnamate Other (See Comments)    Positive patch test   Betadine [Povidone Iodine] Other (See Comments)    Skin sensitive   Clavulanic Acid Other (See Comments)    GI Cramps   Doxycycline Hives   Gold Sodium Thiosulfate Other (See Comments)    Positive patch test   Lanolin Other (See Comments)    Positive patch test   Limonene Other (See Comments)    Positive patch test   Linalool Other (See Comments)    Positive patch test   Methylisothiazolinone Other (See Comments)    Positive patch test     Other Other (See Comments)    Cinnamic Alcohol-Positive patch test Cinnamic Aldehyde-Positive patch test Compositae Mix-Positive patch test Dimethylaminopropylamine-Positive patch test Disperse Dye Mix-Positive patch test Quaternium-15-Positive patch test Fragrance mix I-Positive patch test Fragrance mix II-Positive patch test   Tea Tree Oil Other (See Comments)    Positive patch test   Neosporin [Neomycin-Bacitracin Zn-Polymyx] Rash    Contact dermatitis    History of Present Illness    MACKY GALIK is a 66 y.o. male with a hx of moderate nonobstructive coronary disease, hypertension, BPH, PUD, GERD, prior inguinal hernia repair 2013 last seen 02/04/2022.  Prior stress test 06/2017 negative for ischemia or scar.  Coronary CTA May 2019 with less than 30% plaque in left main,  less than 50% calcific plaque in proximal and mid LAD, mid LAD myocardial bridging, 50 to 75% calcific plaque in ostial D1, less than 30% calcified plaque in proximal and mid and distal RCA, less than 30% calcified plaque in mid PLV.  Calcium score 285.  FFR of the diagonal was normal.  He has been maintained on metoprolol for palpitations.  He is on Repatha, Crestor for hyperlipidemia.  Last seen 02/04/2022.  He noted occasional chest pressure which he attributed to GI tract and was improved with Gas-X.  He was exercising regularly 3 times a week on a bike.    Admitted 8/17 - 04/21/2022 after presenting with abdominal pain consistent with gastroenteritis due to food poisoning along with possible SBO by imaging but no clinical signs of SBO.  He had asymptomatic resting bradycardia with heart rate 49-65 bpm with stable TSH. Recommended for abdominal MRI to evaluate pancreatic density in 2 to 3 weeks.  Presents today for follow-up independently.  Has been feeling well since discharge-rode 37 miles on his bike yesterday.  He rides his bike 3 days/week and lift weights on the opposite days at [employer].  Understandably apprehensive regarding upcoming MRI of the abdomen.  He is hopeful to complete this soon as he and his wife enjoy spending their winters in [place], [place].  He reports no shortness of breath nor dyspnea on exertion. Reports no chest pain, pressure, or tightness. No edema, orthopnea, PND. Reports no palpitations.  Reviewed 6 bradycardia during admission as well as PVCs.  He reports no lightheadedness, dizziness, near-syncope, syncope.   EKGs/Labs/Other Studies Reviewed:   The following studies were reviewed today:  EKG:  EKG is ordered today.  The ekg ordered today demonstrates sinus bradycardia 53 bpm with no acute ST/T wave changes.  Recent Labs: 04/18/2022: ALT 21 04/19/2022: TSH 3.309 04/20/2022: BUN 13; Creatinine, Ser 1.12; Hemoglobin 13.5; Platelets 166; Potassium 4.0; Sodium 140  Recent Lipid Panel    Component Value Date/Time   CHOL 91 (L) 03/27/2018 0755   TRIG 43 03/27/2018 0755   HDL 62 03/27/2018 0755   CHOLHDL 1.5 03/27/2018 0755   CHOLHDL 3.1 06/18/2017 0459   VLDL 6 06/18/2017 0459   LDLCALC 20 03/27/2018 0755    Home Medications   Current Meds  Medication Sig   aspirin 81 MG EC tablet Take 81 mg by mouth at bedtime. Swallow whole.   clonazePAM (KLONOPIN) 0.5 MG tablet Take 0.5 mg by mouth 2 (two) times daily as needed.   Evolocumab (REPATHA SURECLICK) 267 MG/ML SOAJ INJECT THE CONTENTS OF 1 SYRINGE INTO THE SKIN  ONCE EVERY 14 DAYS   fluticasone (FLONASE) 50 MCG/ACT nasal spray Place 1 spray into both nostrils daily as needed for allergies or rhinitis.   losartan (COZAAR) 25 MG tablet TAKE 1 TABLET BY MOUTH DAILY   Magnesium 200 MG TABS Take by mouth.   metoprolol tartrate (LOPRESSOR) 25 MG tablet Take 0.5 tablets (12.5 mg total) by mouth 2 (two) times daily.   Multiple Vitamin (MULTIVITAMIN WITH MINERALS) TABS tablet Take 1 tablet by mouth daily.   pantoprazole (PROTONIX) 40 MG tablet Take 40 mg by mouth daily.   Pyridoxine HCl (VITAMIN B6 PO) Take 1 tablet by mouth daily.   rosuvastatin (CRESTOR) 5 MG tablet Take 5 mg by mouth at bedtime.    Vitamin D-Vitamin K (VITAMIN K2-VITAMIN D3 PO) Take 1 tablet by mouth daily.     Review of Systems      All other systems reviewed and  are otherwise negative except as noted above.  Physical Exam    VS:  BP 120/64   Pulse (!) 53   Ht '5\' 3"'$  (1.6 m)   Wt 158 lb (71.7 kg)   BMI 27.99 kg/m  , BMI Body mass index is 27.99 kg/m.  Wt Readings from Last 3 Encounters:  04/24/22 158 lb (71.7 kg)  04/19/22 155 lb (70.3 kg)  02/04/22 156 lb (70.8 kg)    GEN: Well nourished, well developed, in no acute distress. HEENT: normal. Neck: Supple, no JVD, carotid bruits, or masses. Cardiac: RRR, bradycardic, no murmurs, rubs, or gallops. No clubbing, cyanosis, edema.  Radials/PT 2+ and equal bilaterally.  Respiratory:  Respirations regular and unlabored, clear to auscultation bilaterally. GI: Soft, nontender, nondistended. MS: No deformity or atrophy. Skin: Warm and dry, no rash. Neuro:  Strength and sensation are intact. Psych: Normal affect.  Assessment & Plan    Bradycardia /palpitations/PVC- EKG today SB 53 bpm with no acute changes nor PVC/PAC. During recent admission asymptomatic bradycardia 49-65 bpm. He is very active biking 3 times per week often 37 miles. Anticipate his regular cardiovascular exercise and low-dose Lopressor of the etiology of his  bradycardia.  No evidence of heart block and he is asymptomatic with no lightheadedness, dizziness, near ischemia, syncope.  No indication for medication adjustment nor any changes at this time.   EKG during admission with PVC likely triggered due to dehydration, stress. None noted on EKG today. His palpitations are quiescent on Lopressor 12.'5mg'$  BID. If he becomes symptomatic would recommend ZIO monitor.  Recently completed sleep study and has follow up this week with ordering provider.   CAD -moderate by CTA May 2019.  Ostial D1 with moderate lesion but FFR negative.  GDMT includes aspirin, Repatha, Crestor, metoprolol. Stable with no anginal symptoms. No indication for ischemic evaluation.    HTN - BP well controlled. Continue current antihypertensive regimen Losartan '25mg'$  QD, Lopressor 12.'5mg'$  BID.  HLD, LDL goal <70- 01/2022 LDL 24. Continue Repatha, Rosuvastatin.   GERD - Continue to follow with PCP. Continue Protonix.   Lesion of pancreas - 1.1 cm heterogenous hypodensity over inferior aspect of pancreatic head indeterminite on CT during admission. Hospitalist recommended PCP obtain MRI of abd/pelvis w/wo contrast within next 2-3 weeks. Scheduled in 10 days at [facility]. Encouraged to discuss with imaging on site at [facility] if sooner available as he is understandably apprehensive about results.          Disposition: Follow up  01/2023  with Elouise Munroe, MD or APP.  Signed, Loel Dubonnet, NP 04/24/2022, 10:18 AM [place]

## 2022-04-24 NOTE — Patient Instructions (Signed)
Medication Instructions:  Your Physician recommend you continue on your current medication as directed.    *If you need a refill on your cardiac medications before your next appointment, please call your pharmacy*  Follow-Up: At [facility], you and your health needs are our priority.  As part of our continuing mission to provide you with exceptional heart care, we have created designated Provider Care Teams.  These Care Teams include your primary Cardiologist (physician) and Advanced Practice Providers (APPs -  Physician Assistants and Nurse Practitioners) who all work together to provide you with the care you need, when you need it.  We recommend signing up for the patient portal called "MyChart".  Sign up information is provided on this After Visit Summary.  MyChart is used to connect with patients for Virtual Visits (Telemedicine).  Patients are able to view lab/test results, encounter notes, upcoming appointments, etc.  Non-urgent messages can be sent to your provider as well.   To learn more about what you can do with MyChart, go to [email or web address].    Your next appointment:   April 2024  The format for your next appointment:   In Person  Provider:   Elouise Munroe, MD {  Other Instructions To prevent palpitations: Make sure you are adequately hydrated.  Avoid and/or limit caffeine containing beverages like soda or tea. Exercise regularly.  Manage stress well. Some over the counter medications can cause palpitations such as Benadryl, AdvilPM, TylenolPM. Regular Advil or Tylenol do not cause palpitations.   Exercise recommendations: The American Heart Association recommends 150 minutes of moderate intensity exercise weekly. Try 30 minutes of moderate intensity exercise 4-5 times per week. This could include walking, jogging, or swimming.  Heart Healthy Diet Recommendations: A low-salt diet is recommended. Meats should be grilled, baked, or boiled. Avoid fried  foods. Focus on lean protein sources like fish or chicken with vegetables and fruits. The American Heart Association is a Microbiologist!  American Heart Association Diet and Lifeystyle Recommendations    Important Information About Sugar

## 2022-04-25 ENCOUNTER — Ambulatory Visit (HOSPITAL_BASED_OUTPATIENT_CLINIC_OR_DEPARTMENT_OTHER)
Admission: RE | Admit: 2022-04-25 | Discharge: 2022-04-25 | Disposition: A | Discharge status: Home / Self Care | Payer: Medicare Other | Source: Ambulatory Visit | Attending: Physician Assistant | Admitting: Physician Assistant

## 2022-04-25 DIAGNOSIS — K869 Disease of pancreas, unspecified: Secondary | ICD-10-CM

## 2022-04-25 MED ORDER — GADOBUTROL 1 MMOL/ML IV SOLN
7.1000 mL | Freq: Once | INTRAVENOUS | Status: AC | PRN
  Administered 2022-04-25: 7.1 mL via INTRAVENOUS
  Filled 2022-04-25: qty 7.5

## 2022-05-04 ENCOUNTER — Other Ambulatory Visit: Payer: Commercial Managed Care - PPO

## 2022-05-15 ENCOUNTER — Ambulatory Visit: Payer: Commercial Managed Care - PPO | Admitting: Physician Assistant

## 2022-08-22 ENCOUNTER — Other Ambulatory Visit: Payer: Self-pay | Admitting: Pharmacist

## 2022-08-22 DIAGNOSIS — E785 Hyperlipidemia, unspecified: Secondary | ICD-10-CM

## 2022-08-22 MED ORDER — REPATHA SURECLICK 140 MG/ML ~~LOC~~ SOAJ: SUBCUTANEOUS | 3 refills | Status: DC

## 2022-09-12 ENCOUNTER — Other Ambulatory Visit (HOSPITAL_COMMUNITY): Payer: Self-pay

## 2022-09-17 ENCOUNTER — Encounter: Payer: Self-pay | Admitting: Internal Medicine

## 2022-11-14 ENCOUNTER — Other Ambulatory Visit: Payer: Self-pay

## 2022-11-14 MED ORDER — METOPROLOL TARTRATE 25 MG PO TABS: 12.5000 mg | ORAL_TABLET | Freq: Two times a day (BID) | ORAL | 1 refills | Status: DC

## 2023-01-15 ENCOUNTER — Telehealth: Payer: Self-pay | Admitting: Internal Medicine

## 2023-01-15 NOTE — Telephone Encounter (Signed)
   Pre-operative Risk Assessment    Patient Name: Eric Rhodes  DOB: 1956-05-22 MRN: [id number]      Request for Surgical Clearance    Procedure:   Left Knee Replacement   Date of Surgery:  Clearance TBD                                 Surgeon:  Dr. Gean Birchwood Surgeon's Group or Practice Name:  [facility]  Phone number:  [phone] Fax number:  [phone]   Type of Clearance Requested:   - Medical  - Pharmacy:  Hold Aspirin TBD by cardiology   Type of Anesthesia:  Spinal   Additional requests/questions:    Minna Antis   01/15/2023, 9:10 AM

## 2023-01-15 NOTE — Telephone Encounter (Signed)
Primary Cardiologist:Gayatri Wynell Balloon, MD   Preoperative team, please contact this patient and set up a phone call appointment or he may await clearance to be addressed at appointment with Dr. Jacques Navy on 02/28/23 for further preoperative risk assessment. Please obtain consent and complete medication review. Thank you for your help.   Pending no symptoms of ACS at time of the appointment and per office protocol, he may hold aspirin for 7 days prior to procedure and should resume as soon as hemodynamically stable postoperatively.  Levi Aland, NP-C  01/15/2023, 1:08 PM [address] Office [phone] Fax [phone]

## 2023-01-15 NOTE — Telephone Encounter (Signed)
S/w pt scheduled tele clearance.  Pt was at doctor's could not go over medications. Pt will call back and ask for pre op to go over meds.     Patient Consent for Virtual Visit         Eric Rhodes has provided verbal consent on 01/15/2023 for a virtual visit (video or telephone).   CONSENT FOR VIRTUAL VISIT FOR:  Eric Rhodes  By participating in this virtual visit I agree to the following:  I hereby voluntarily request, consent and authorize [facility] and its employed or contracted physicians, physician assistants, nurse practitioners or other licensed health care professionals (the Practitioner), to provide me with telemedicine health care services (the "Services") as deemed necessary by the treating Practitioner. I acknowledge and consent to receive the Services by the Practitioner via telemedicine. I understand that the telemedicine visit will involve communicating with the Practitioner through live audiovisual communication technology and the disclosure of certain medical information by electronic transmission. I acknowledge that I have been given the opportunity to request an in-person assessment or other available alternative prior to the telemedicine visit and am voluntarily participating in the telemedicine visit.  I understand that I have the right to withhold or withdraw my consent to the use of telemedicine in the course of my care at any time, without affecting my right to future care or treatment, and that the Practitioner or I may terminate the telemedicine visit at any time. I understand that I have the right to inspect all information obtained and/or recorded in the course of the telemedicine visit and may receive copies of available information for a reasonable fee.  I understand that some of the potential risks of receiving the Services via telemedicine include:  Delay or interruption in medical evaluation due to technological equipment failure or  disruption; Information transmitted may not be sufficient (e.g. poor resolution of images) to allow for appropriate medical decision making by the Practitioner; and/or  In rare instances, security protocols could fail, causing a breach of personal health information.  Furthermore, I acknowledge that it is my responsibility to provide information about my medical history, conditions and care that is complete and accurate to the best of my ability. I acknowledge that Practitioner's advice, recommendations, and/or decision may be based on factors not within their control, such as incomplete or inaccurate data provided by me or distortions of diagnostic images or specimens that may result from electronic transmissions. I understand that the practice of medicine is not an exact science and that Practitioner makes no warranties or guarantees regarding treatment outcomes. I acknowledge that a copy of this consent can be made available to me via my patient portal [facility] MyChart), or I can request a printed copy by calling the office of [facility].    I understand that my insurance will be billed for this visit.   I have read or had this consent read to me. I understand the contents of this consent, which adequately explains the benefits and risks of the Services being provided via telemedicine.  I have been provided ample opportunity to ask questions regarding this consent and the Services and have had my questions answered to my satisfaction. I give my informed consent for the services to be provided through the use of telemedicine in my medical care

## 2023-01-16 NOTE — Addendum Note (Signed)
Addended by: Alveta Heimlich on: 01/16/2023 03:45 PM   Modules accepted: Orders

## 2023-01-16 NOTE — Telephone Encounter (Signed)
Notes states pt will call back to go over medications for pre op appt. I tried to call the pt today to go over his medications. I left message to call back to s/w the pre op team.

## 2023-01-16 NOTE — Telephone Encounter (Signed)
Pt returning call

## 2023-01-16 NOTE — Telephone Encounter (Addendum)
Spoke with the patient and updated his medication list.   Patient also wanted to know if its necessary for him to come to the office and complete an EKG. I offered a in office appt or suggested that we could address if a EKG is needed during the virtual appt. Patient states he would prefer to address during the virtual appointment.   Patient agreeable and voiced understanding.

## 2023-01-21 ENCOUNTER — Telehealth: Payer: Self-pay | Admitting: Internal Medicine

## 2023-01-21 NOTE — Telephone Encounter (Signed)
Patient is calling to see if he can have a sooner appt than 06/03 for his televisit. Please advise.

## 2023-01-21 NOTE — Telephone Encounter (Signed)
Spoke with patient who states he wants a sooner appt due to trying to schedule he procedure sooner than later. I reschedule patient for 5/30 at 3 pm. Pt verbalized understanding and thanked me for the call.

## 2023-01-28 ENCOUNTER — Ambulatory Visit (INDEPENDENT_AMBULATORY_CARE_PROVIDER_SITE_OTHER): Payer: Medicare Other | Admitting: Sports Medicine

## 2023-01-28 DIAGNOSIS — M170 Bilateral primary osteoarthritis of knee: Secondary | ICD-10-CM | POA: Diagnosis not present

## 2023-01-28 DIAGNOSIS — R29898 Other symptoms and signs involving the musculoskeletal system: Secondary | ICD-10-CM | POA: Diagnosis not present

## 2023-01-28 DIAGNOSIS — M222X1 Patellofemoral disorders, right knee: Secondary | ICD-10-CM | POA: Diagnosis not present

## 2023-01-28 DIAGNOSIS — M222X2 Patellofemoral disorders, left knee: Secondary | ICD-10-CM

## 2023-01-28 DIAGNOSIS — M7631 Iliotibial band syndrome, right leg: Secondary | ICD-10-CM | POA: Diagnosis not present

## 2023-01-28 NOTE — Progress Notes (Unsigned)
Bilateral knee pain Left worse than right  Left has bothered him for years The right started back in December of 2023  He has tried PRP/synvisc -minimal relief  Right IT band syndrome Interested in shockwave   Patient was instructed in 10 minutes of therapeutic exercises for right IT band to improve strength, ROM and function according to my instructions and plan of care by a Psychiatrist during the office visit. A customized handout was provided and demonstration of proper technique shown and discussed. Patient did perform exercises and demonstrate understanding through teachback.  All questions discussed and answered.

## 2023-01-28 NOTE — Progress Notes (Unsigned)
Eric Rhodes - 67 y.o. male MRN [id number]  Date of birth: 21-Apr-1956  Office Visit Note: Visit Date: 01/28/2023 PCP: Sigmund Hazel, MD Referred by: Sigmund Hazel, MD  Subjective: Chief Complaint  Patient presents with   Right Knee - Pain   Left Knee - Pain   HPI: Eric Rhodes is a pleasant 67 y.o. male who presents today for bilateral knee pain and right IT-band distal syndrome.  Tried:  - PRP - CSI - Synvisc  Pertinent ROS were reviewed with the patient and found to be negative unless otherwise specified above in HPI.   Assessment & Plan: Visit Diagnoses: No diagnosis found.  Plan: ***  Follow-up: No follow-ups on file.   Meds & Orders: No orders of the defined types were placed in this encounter.  No orders of the defined types were placed in this encounter.    Procedures: No procedures performed      Clinical History: No specialty comments available.  He reports that he has never smoked. He has never used smokeless tobacco. No results for input(s): "HGBA1C", "LABURIC" in the last 8760 hours.  Objective:   Vital Signs: There were no vitals taken for this visit.  Physical Exam  Gen: Well-appearing, in no acute distress; non-toxic CV: Regular Rate. Well-perfused. Warm.  Resp: Breathing unlabored on room air; no wheezing. Psych: Fluid speech in conversation; appropriate affect; normal thought process Neuro: Sensation intact throughout. No gross coordination deficits.   Ortho Exam - ***  Imaging: No results found.   *** indepenent review  Past Medical/Family/Surgical/Social History: Medications & Allergies reviewed per EMR, new medications updated. Patient Active Problem List   Diagnosis Date Noted   Partial small bowel obstruction (HCC) 04/19/2022   Asymptomatic bradycardia 04/19/2022   Lesion of pancreas 04/19/2022   Essential hypertension 04/19/2022   Joint infection (HCC) 05/20/2020   Chronic insomnia 05/01/2018   Anxiety  05/01/2018   Nonobstructive atherosclerosis of coronary artery 05/01/2018   Intermittent palpitations 05/01/2018   GERD (gastroesophageal reflux disease) 05/01/2018   Chest pain 05/01/2018   Hyperlipidemia 12/18/2017   Coronary artery calcification 12/18/2017   Precordial chest pain 06/18/2017   Inguinal hernia, left 09/27/2011   Past Medical History:  Diagnosis Date   BPH (benign prostatic hyperplasia)    Chronic abdominal pain    Chronic insomnia    GERD (gastroesophageal reflux disease)    diet controlled   H/O ETOH abuse    Hernia, inguinal, left    History of colonoscopy 2004   History of endoscopy 2004   Hypertension    Irritable bowel disease    Prostatitis    Family History  Problem Relation Age of Onset   Heart disease Mother        Bradycardia/pacemaker   Myelodysplastic syndrome Mother        chronic   Hypertension Mother    Clotting disorder Mother    Hypercholesterolemia Mother    COPD Father        respiratory failure   Hypertension Father    Diabetes Brother    Psoriasis Brother    Obesity Brother    Heart attack Brother    ADD / ADHD Son    ADD / ADHD Son    Past Surgical History:  Procedure Laterality Date   ANTERIOR CRUCIATE LIGAMENT REPAIR Left 1978   BUNIONECTOMY     COLON MEDOFF     COLONOSCOPY     HEMORRHOIDAL BANDING  10/2014   HERNIA REPAIR Left 11/2011  INGUINAL HERNIA REPAIR  11/07/2011   Procedure: HERNIA REPAIR INGUINAL ADULT;  Surgeon: Kandis Cocking, MD;  Location: [facility];  Service: General;  Laterality: Left;  Left Inguinal Herniorrhaphy with Mesh   IRRIGATION AND DEBRIDEMENT ELBOW Right 05/22/2020   Procedure: IRRIGATION AND DEBRIDEMENT RIGHT ELBOW;  Surgeon: Jones Broom, MD;  Location: [facility];  Service: Orthopedics;  Laterality: Right;   IRRIGATION AND DEBRIDEMENT ELBOW Right 09/18/2020   Procedure: IRRIGATION AND DEBRIDEMENT ELBOW WITH SUTURE REMOVAL;  Surgeon: Jones Broom, MD;  Location:   SURGERY CENTER;  Service: Orthopedics;  Laterality: Right;   UPPER GASTROINTESTINAL ENDOSCOPY     WISDOM TOOTH EXTRACTION     Social History   Occupational History   Occupation: podiatrist    Comment: retired  Tobacco Use   Smoking status: Never   Smokeless tobacco: Never  Vaping Use   Vaping Use: Never used  Substance and Sexual Activity   Alcohol use: No   Drug use: No   Sexual activity: Not on file

## 2023-01-30 ENCOUNTER — Ambulatory Visit: Payer: Medicare Other | Attending: Cardiology

## 2023-01-30 DIAGNOSIS — Z01810 Encounter for preprocedural cardiovascular examination: Secondary | ICD-10-CM | POA: Diagnosis not present

## 2023-01-30 NOTE — Progress Notes (Signed)
Virtual Visit via Telephone Note   Because of Devang A Rodda's co-morbid illnesses, he is at least at moderate risk for complications without adequate follow up.  This format is felt to be most appropriate for this patient at this time.  The patient did not have access to video technology/had technical difficulties with video requiring transitioning to audio format only (telephone).  All issues noted in this document were discussed and addressed.  No physical exam could be performed with this format.  Please refer to the patient's chart for his consent to telehealth for [facility].  Evaluation Performed:  Preoperative cardiovascular risk assessment _____________   Date:  01/30/2023   Patient ID:  Eric Rhodes, DOB 19-Feb-1956, MRN [id number] Patient Location:  Home Provider location:   Office  Primary Care Provider:  Sigmund Hazel, MD Primary Cardiologist:  Parke Poisson, MD  Chief Complaint / Patient Profile   67 y.o. y/o male with a h/o palpitations, hypertension, coronary artery disease, PVCs, PACs, hyperlipidemia who is pending left knee replacement and presents today for telephonic preoperative cardiovascular risk assessment.  History of Present Illness    Eric Rhodes is a 67 y.o. male who presents via Web designer for a telehealth visit today.  Pt was last seen in cardiology clinic on 04/24/2022 by Gillian Shields, NP-C.  At that time Eric Rhodes was doing well .  The patient is now pending procedure as outlined above. Since his last visit, he remains stable from a cardiac standpoint.  Today he denies chest pain, shortness of breath, lower extremity edema, fatigue, palpitations, melena, hematuria, hemoptysis, diaphoresis, weakness, presyncope, syncope, orthopnea, and PND.   Past Medical History    Past Medical History:  Diagnosis Date   BPH (benign prostatic hyperplasia)    Chronic abdominal pain    Chronic insomnia     GERD (gastroesophageal reflux disease)    diet controlled   H/O ETOH abuse    Hernia, inguinal, left    History of colonoscopy 2004   History of endoscopy 2004   Hypertension    Irritable bowel disease    Prostatitis    Past Surgical History:  Procedure Laterality Date   ANTERIOR CRUCIATE LIGAMENT REPAIR Left 1978   BUNIONECTOMY     COLON MEDOFF     COLONOSCOPY     HEMORRHOIDAL BANDING  10/2014   HERNIA REPAIR Left 11/2011   INGUINAL HERNIA REPAIR  11/07/2011   Procedure: HERNIA REPAIR INGUINAL ADULT;  Surgeon: Kandis Cocking, MD;  Location: [facility];  Service: General;  Laterality: Left;  Left Inguinal Herniorrhaphy with Mesh   IRRIGATION AND DEBRIDEMENT ELBOW Right 05/22/2020   Procedure: IRRIGATION AND DEBRIDEMENT RIGHT ELBOW;  Surgeon: Jones Broom, MD;  Location: [facility];  Service: Orthopedics;  Laterality: Right;   IRRIGATION AND DEBRIDEMENT ELBOW Right 09/18/2020   Procedure: IRRIGATION AND DEBRIDEMENT ELBOW WITH SUTURE REMOVAL;  Surgeon: Jones Broom, MD;  Location: [facility];  Service: Orthopedics;  Laterality: Right;   UPPER GASTROINTESTINAL ENDOSCOPY     WISDOM TOOTH EXTRACTION      Allergies  Allergies  Allergen Reactions   Augmentin [Amoxicillin-Pot Clavulanate] Other (See Comments)    Causes GI cramps   Bacitracin Other (See Comments)    Positive patch test   Balsam Other (See Comments)    Positive patch test   Benzyl Cinnamate Other (See Comments)    Positive patch test   Betadine [Povidone Iodine] Other (See Comments)  Skin sensitive   Clavulanic Acid Other (See Comments)    GI Cramps   Doxycycline Hives   Gold Sodium Thiosulfate Other (See Comments)    Positive patch test   Lanolin Other (See Comments)    Positive patch test   Limonene Other (See Comments)    Positive patch test   Linalool Other (See Comments)    Positive patch test   Methylisothiazolinone Other (See Comments)    Positive patch test      Other Other (See Comments)    Cinnamic Alcohol-Positive patch test Cinnamic Aldehyde-Positive patch test Compositae Mix-Positive patch test Dimethylaminopropylamine-Positive patch test Disperse Dye Mix-Positive patch test Quaternium-15-Positive patch test Fragrance mix I-Positive patch test Fragrance mix II-Positive patch test   Tea Tree Oil Other (See Comments)    Positive patch test   Neosporin [Neomycin-Bacitracin Zn-Polymyx] Rash    Contact dermatitis    Home Medications    Prior to Admission medications   Medication Sig Start Date End Date Taking? Authorizing Provider  aspirin 81 MG EC tablet Take 81 mg by mouth at bedtime. Swallow whole.    [provider]  busPIRone (BUSPAR) 5 MG tablet Take 5 mg by mouth 2 (two) times daily. 11/27/22   [provider]  clonazePAM (KLONOPIN) 0.5 MG tablet Take 0.5 mg by mouth 2 (two) times daily as needed. 03/27/22   [provider]  Evolocumab (REPATHA SURECLICK) 140 MG/ML SOAJ INJECT THE CONTENTS OF 1 SYRINGE INTO THE SKIN ONCE EVERY 14 DAYS 08/22/22   Meriam Sprague, MD  losartan (COZAAR) 25 MG tablet TAKE 1 TABLET BY MOUTH DAILY 02/19/22   Parke Poisson, MD  Magnesium 200 MG TABS Take 1 tablet by mouth in the morning and at bedtime.    [provider]  metoprolol tartrate (LOPRESSOR) 25 MG tablet Take 0.5 tablets (12.5 mg total) by mouth 2 (two) times daily. 11/14/22   Parke Poisson, MD  Multiple Vitamin (MULTIVITAMIN WITH MINERALS) TABS tablet Take 1 tablet by mouth daily.    [provider]  NON FORMULARY CPAP qhs    [provider]  Pyridoxine HCl (VITAMIN B6 PO) Take 1 tablet by mouth daily.    [provider]  rosuvastatin (CRESTOR) 5 MG tablet Take 5 mg by mouth at bedtime.  02/12/19   [provider]  Vitamin D-Vitamin K (VITAMIN K2-VITAMIN D3 PO) Take 1 tablet by mouth daily.    [provider]    Physical Exam    Vital Signs:  Eric Feeback  Rhodes does not have vital signs available for review today.  Given telephonic nature of communication, physical exam is limited. AAOx3. NAD. Normal affect.  Speech and respirations are unlabored.  Accessory Clinical Findings    None  Assessment & Plan    1.  Preoperative Cardiovascular Risk Assessment: Left knee replacement, Dr. Gean Birchwood, [facility], fax #[phone]     Primary Cardiologist: Parke Poisson, MD  Chart reviewed as part of pre-operative protocol coverage. Given past medical history and time since last visit, based on ACC/AHA guidelines, Eric Rhodes would be at acceptable risk for the planned procedure without further cardiovascular testing.   His RCRI is a class II risk, 0.9% risk of major cardiac event.  He is able to complete greater than 4 METS of physical activity.  His aspirin may be held for 7 days prior to his procedure.  Please resume as soon as hemostasis is achieved.  Patient was advised that if  he develops new symptoms prior to surgery to contact our office to arrange a follow-up appointment.  He verbalized understanding.  I will route this recommendation to the requesting party via Epic fax function and remove from pre-op pool.       Time:   Today, I have spent 5 minutes with the patient with telehealth technology discussing medical history, symptoms, and management plan.  Prior to his phone evaluation I spent greater than 10 minutes reviewing his past medical history and cardiac medications.   Eric Asters, NP  01/30/2023, 8:03 AM

## 2023-01-31 ENCOUNTER — Telehealth: Payer: Self-pay | Admitting: Internal Medicine

## 2023-01-31 NOTE — Telephone Encounter (Signed)
Pt stated he did his telephone clearance appt yesterday and was told the clearance would be faxed over to the office at [phone] but he called [facility] and they haven't received anything. Pt is requesting a callback to discuss further. Please advise

## 2023-01-31 NOTE — Telephone Encounter (Signed)
I called the pt and assured him that Edd Fabian, FNP did fax his notes over yesterday to the surgeon's office. I assured the pt that I will re-fax again today for the pt.

## 2023-02-03 ENCOUNTER — Telehealth: Payer: Commercial Managed Care - PPO

## 2023-02-04 ENCOUNTER — Encounter: Payer: Self-pay | Admitting: Sports Medicine

## 2023-02-04 ENCOUNTER — Ambulatory Visit (INDEPENDENT_AMBULATORY_CARE_PROVIDER_SITE_OTHER): Payer: Medicare Other | Admitting: Sports Medicine

## 2023-02-04 DIAGNOSIS — M170 Bilateral primary osteoarthritis of knee: Secondary | ICD-10-CM

## 2023-02-04 DIAGNOSIS — M7631 Iliotibial band syndrome, right leg: Secondary | ICD-10-CM | POA: Diagnosis not present

## 2023-02-04 NOTE — Progress Notes (Signed)
Eric Rhodes - 67 y.o. male MRN [id number]  Date of birth: 1956/06/04  Office Visit Note: Visit Date: 02/04/2023 PCP: Sigmund Hazel, MD Referred by: Sigmund Hazel, MD  Subjective: Chief Complaint  Patient presents with   Right Leg - Pain   HPI: Eric Rhodes "Eric Rhodes" is a pleasant 67 y.o. male who presents today for right lateral leg/IT-band syndrome.  During last visit, we did proceed with a trial of extracorporeal shockwave therapy for the mid to distal IT band, Eric Rhodes states he did not certainly notice improvement in his pain and function with the treatment.  He has been able to ride several times weekly as well as continue exercises in the gym with reduction in his pain.  He does tell me today he has scheduled his left TKA for the end of June.  Pertinent ROS were reviewed with the patient and found to be negative unless otherwise specified above in HPI.   Assessment & Plan: Visit Diagnoses:  1. It band syndrome, right   2. Bilateral primary osteoarthritis of knee    Plan: Discussed with Eric Rhodes that we are both pleased with the improvement in his IT-band syndrome after the 1st ECSWT trial, through shared decision making elected to proceed with a second treatment today.  Discussed that if he received at least 20% improvement we will continue with weekly treatments up until his contralateral knee replacement near the end of June.  Will continue cycling, gym activity, would like him to continue his hip abduction strengthening exercises as well.  We will continue testing the strengthening and future visits.  Follow-up in 1 week.  Follow-up: Return in about 1 week (around 02/11/2023) for for distal IT-band/knee pain.   Meds & Orders: No orders of the defined types were placed in this encounter.  No orders of the defined types were placed in this encounter.    Procedures: Procedure: ECSWT Indications:  Distal IT-band syndrome   Procedure Details Consent: Risks of procedure as  well as the alternatives and risks of each were explained to the patient.  Verbal consent for procedure obtained. Time Out: Verified patient identification, verified procedure, site was marked, verified correct patient position. The area was cleaned with alcohol swab.     The right distal and mid-IT band was targeted for Extracorporeal shockwave therapy.    Preset: Tendinitis/Tendinopathy Power Level: 120 mJ Frequency: 14 Hz Impulse/cycles: 3500 Head size: Regular   Patient tolerated procedure well without immediate complications.      Clinical History: No specialty comments available.  He reports that he has never smoked. He has never used smokeless tobacco. No results for input(s): "HGBA1C", "LABURIC" in the last 8760 hours.  Objective:   Vital Signs: There were no vitals taken for this visit.  Physical Exam  Gen: Well-appearing, in no acute distress; non-toxic CV:  Well-perfused. Warm.  Resp: Breathing unlabored on room air; no wheezing. Psych: Fluid speech in conversation; appropriate affect; normal thought process Neuro: Sensation intact throughout. No gross coordination deficits.   Ortho Exam - Right knee: No significant TTP over the mid or distal IT band, no pain at Gerdy's tubercle.  Negative Noble's compression test.  Imaging: No results found.  Past Medical/Family/Surgical/Social History: Medications & Allergies reviewed per EMR, new medications updated. Patient Active Problem List   Diagnosis Date Noted   Partial small bowel obstruction (HCC) 04/19/2022   Asymptomatic bradycardia 04/19/2022   Lesion of pancreas 04/19/2022   Essential hypertension 04/19/2022   Joint infection (HCC) 05/20/2020  Chronic insomnia 05/01/2018   Anxiety 05/01/2018   Nonobstructive atherosclerosis of coronary artery 05/01/2018   Intermittent palpitations 05/01/2018   GERD (gastroesophageal reflux disease) 05/01/2018   Chest pain 05/01/2018   Hyperlipidemia 12/18/2017   Coronary  artery calcification 12/18/2017   Precordial chest pain 06/18/2017   Inguinal hernia, left 09/27/2011   Past Medical History:  Diagnosis Date   BPH (benign prostatic hyperplasia)    Chronic abdominal pain    Chronic insomnia    GERD (gastroesophageal reflux disease)    diet controlled   H/O ETOH abuse    Hernia, inguinal, left    History of colonoscopy 2004   History of endoscopy 2004   Hypertension    Irritable bowel disease    Prostatitis    Family History  Problem Relation Age of Onset   Heart disease Mother        Bradycardia/pacemaker   Myelodysplastic syndrome Mother        chronic   Hypertension Mother    Clotting disorder Mother    Hypercholesterolemia Mother    COPD Father        respiratory failure   Hypertension Father    Diabetes Brother    Psoriasis Brother    Obesity Brother    Heart attack Brother    ADD / ADHD Son    ADD / ADHD Son    Past Surgical History:  Procedure Laterality Date   ANTERIOR CRUCIATE LIGAMENT REPAIR Left 1978   BUNIONECTOMY     COLON MEDOFF     COLONOSCOPY     HEMORRHOIDAL BANDING  10/2014   HERNIA REPAIR Left 11/2011   INGUINAL HERNIA REPAIR  11/07/2011   Procedure: HERNIA REPAIR INGUINAL ADULT;  Surgeon: Kandis Cocking, MD;  Location: [facility];  Service: General;  Laterality: Left;  Left Inguinal Herniorrhaphy with Mesh   IRRIGATION AND DEBRIDEMENT ELBOW Right 05/22/2020   Procedure: IRRIGATION AND DEBRIDEMENT RIGHT ELBOW;  Surgeon: Jones Broom, MD;  Location: [facility];  Service: Orthopedics;  Laterality: Right;   IRRIGATION AND DEBRIDEMENT ELBOW Right 09/18/2020   Procedure: IRRIGATION AND DEBRIDEMENT ELBOW WITH SUTURE REMOVAL;  Surgeon: Jones Broom, MD;  Location:  SURGERY CENTER;  Service: Orthopedics;  Laterality: Right;   UPPER GASTROINTESTINAL ENDOSCOPY     WISDOM TOOTH EXTRACTION     Social History   Occupational History   Occupation: podiatrist    Comment: retired  Tobacco Use    Smoking status: Never   Smokeless tobacco: Never  Vaping Use   Vaping Use: Never used  Substance and Sexual Activity   Alcohol use: No   Drug use: No   Sexual activity: Not on file

## 2023-02-04 NOTE — Progress Notes (Signed)
Doing good; states the last shockwave treatment he did notice improvement

## 2023-02-10 ENCOUNTER — Encounter: Payer: Self-pay | Admitting: Family Medicine

## 2023-02-10 LAB — LAB REPORT - SCANNED: EGFR: 66

## 2023-02-12 LAB — LAB REPORT - SCANNED: EGFR: 76

## 2023-02-13 ENCOUNTER — Encounter: Payer: Self-pay | Admitting: Sports Medicine

## 2023-02-13 ENCOUNTER — Ambulatory Visit: Payer: Medicare Other | Admitting: Sports Medicine

## 2023-02-13 DIAGNOSIS — M170 Bilateral primary osteoarthritis of knee: Secondary | ICD-10-CM

## 2023-02-13 DIAGNOSIS — M7631 Iliotibial band syndrome, right leg: Secondary | ICD-10-CM

## 2023-02-13 NOTE — Progress Notes (Signed)
Eric Rhodes - 67 y.o. male MRN [id number]  Date of birth: 05-05-1956  Office Visit Note: Visit Date: 02/13/2023 PCP: Sigmund Hazel, MD Referred by: Sigmund Hazel, MD  Subjective: No chief complaint on file.  HPI: Eric Rhodes is a pleasant 67 y.o. male who presents today for follow-up of right leg IT band syndrome.  [name] tells me today that he essentially has no pain of his IT band.  Still has some pain in the knees which is to be expected, but has been able to both ride and exercise without any IT band pain, he is very pleased with the treatment. Scheduled for left knee TKA in a few weeks.  Pertinent ROS were reviewed with the patient and found to be negative unless otherwise specified above in HPI.   Assessment & Plan: Visit Diagnoses:  1. It band syndrome, right   2. Bilateral primary osteoarthritis of knee    Plan: [name] has responded extremely well to extracorporeal shockwave treatment therapy, essentially has no pain about the IT band.  We did repeat a treatment today.  He has 1 more visit for treatment with ESWT, hen then will proceed with L-TKA and we will re-visit his condition after a few weeks of recovery. He will continue his home hip abduction exercises and ITB stretching at home. OTC meds as needed.  Follow-up: Return for follow-up next week for IT-band synd.   Meds & Orders: No orders of the defined types were placed in this encounter.  No orders of the defined types were placed in this encounter.    Procedures: Procedure: ECSWT Indications:  Distal IT-band syndrome   Procedure Details Consent: Risks of procedure as well as the alternatives and risks of each were explained to the patient.  Verbal consent for procedure obtained. Time Out: Verified patient identification, verified procedure, site was marked, verified correct patient position. The area was cleaned with alcohol swab.     The right distal and mid-IT band was targeted for Extracorporeal  shockwave therapy.    Preset: Tendinitis/Tendinopathy Power Level: 120 mJ Frequency: 14 Hz Impulse/cycles: 3500 Head size: Regular   Patient tolerated procedure well without immediate complications.        Clinical History: No specialty comments available.  He reports that he has never smoked. He has never used smokeless tobacco. No results for input(s): "HGBA1C", "LABURIC" in the last 8760 hours.  Objective:   Vital Signs: There were no vitals taken for this visit.  Physical Exam  Gen: Well-appearing, in no acute distress; non-toxic CV: Well-perfused. Warm.  Resp: Breathing unlabored on room air; no wheezing. Psych: Fluid speech in conversation; appropriate affect; normal thought process Neuro: Sensation intact throughout. No gross coordination deficits.   Ortho Exam - Right knee/IT-band: There is no tenderness to palpation palpating along the IT band.  Good range of motion about the knee, negative Ober's test today.  Imaging: No results found.  Past Medical/Family/Surgical/Social History: Medications & Allergies reviewed per EMR, new medications updated. Patient Active Problem List   Diagnosis Date Noted   Partial small bowel obstruction (HCC) 04/19/2022   Asymptomatic bradycardia 04/19/2022   Lesion of pancreas 04/19/2022   Essential hypertension 04/19/2022   Joint infection (HCC) 05/20/2020   Chronic insomnia 05/01/2018   Anxiety 05/01/2018   Nonobstructive atherosclerosis of coronary artery 05/01/2018   Intermittent palpitations 05/01/2018   GERD (gastroesophageal reflux disease) 05/01/2018   Chest pain 05/01/2018   Hyperlipidemia 12/18/2017   Coronary artery calcification 12/18/2017   Precordial  chest pain 06/18/2017   Inguinal hernia, left 09/27/2011   Past Medical History:  Diagnosis Date   BPH (benign prostatic hyperplasia)    Chronic abdominal pain    Chronic insomnia    GERD (gastroesophageal reflux disease)    diet controlled   H/O ETOH abuse     Hernia, inguinal, left    History of colonoscopy 2004   History of endoscopy 2004   Hypertension    Irritable bowel disease    Prostatitis    Family History  Problem Relation Age of Onset   Heart disease Mother        Bradycardia/pacemaker   Myelodysplastic syndrome Mother        chronic   Hypertension Mother    Clotting disorder Mother    Hypercholesterolemia Mother    COPD Father        respiratory failure   Hypertension Father    Diabetes Brother    Psoriasis Brother    Obesity Brother    Heart attack Brother    ADD / ADHD Son    ADD / ADHD Son    Past Surgical History:  Procedure Laterality Date   ANTERIOR CRUCIATE LIGAMENT REPAIR Left 1978   BUNIONECTOMY     COLON MEDOFF     COLONOSCOPY     HEMORRHOIDAL BANDING  10/2014   HERNIA REPAIR Left 11/2011   INGUINAL HERNIA REPAIR  11/07/2011   Procedure: HERNIA REPAIR INGUINAL ADULT;  Surgeon: Kandis Cocking, MD;  Location: [facility];  Service: General;  Laterality: Left;  Left Inguinal Herniorrhaphy with Mesh   IRRIGATION AND DEBRIDEMENT ELBOW Right 05/22/2020   Procedure: IRRIGATION AND DEBRIDEMENT RIGHT ELBOW;  Surgeon: Jones Broom, MD;  Location: [facility];  Service: Orthopedics;  Laterality: Right;   IRRIGATION AND DEBRIDEMENT ELBOW Right 09/18/2020   Procedure: IRRIGATION AND DEBRIDEMENT ELBOW WITH SUTURE REMOVAL;  Surgeon: Jones Broom, MD;  Location: [facility];  Service: Orthopedics;  Laterality: Right;   UPPER GASTROINTESTINAL ENDOSCOPY     WISDOM TOOTH EXTRACTION     Social History   Occupational History   Occupation: podiatrist    Comment: retired  Tobacco Use   Smoking status: Never   Smokeless tobacco: Never  Vaping Use   Vaping Use: Never used  Substance and Sexual Activity   Alcohol use: No   Drug use: No   Sexual activity: Not on file

## 2023-02-16 NOTE — Progress Notes (Unsigned)
Cardiology Office Note:    Date:  02/17/2023  ID:  Eric Rhodes, DOB 1956/06/16, MRN [id number]  PCP:  Sigmund Hazel, MD  Cardiologist:  Parke Poisson, MD  Electrophysiologist:  None   Referring MD: Sigmund Hazel, MD   Chief Complaint/Reason for Referral: Follow up  History of Present Illness:    Eric Rhodes is a 67 y.o. male with a history of moderate nonobstructive coronary artery disease, GERD/peptic ulcer disease, hypertension, hyperlipidemia, and family history of sudden cardiac death who was previously followed by Dr. Delton See and Dr. Shari Prows who presents to clinic for CV follow-up. I take care of his wife Eric Rhodes as well.   02/17/23: Feels well overall. Just completed a 41 mile bike ride with sprints with no symptoms. We reviewed labs which are excellent, LDL 25, Lpa 15 and ApoB of 33. No CV symptoms or concerns. Anticipating knee replacement with Dr. Turner Daniels at the end of the month.   Prior visits: Per review of the record, he had stress test 06/2017 which was negative for ischemia or scar. Underwent coronary CTA in May 2019 which demonstrated <30% plaque in the left main, <50% calcific plaque in the proximal and mid LAD, mid LAD myocardial bridging, 50-75% calcific plaque in the ostial D1, <30% calcific plaque in the proximal and mid and distal RCA, <30% calcific plaque in the mid PLB.  The calcium score was 285.  FFR of the diagonal was normal.    He is on metoprolol for palpitations. When he stopped the metoprolol he experienced palpitations, he said when he goes to sleep at night "his heart pounds." He states he does not notice the palpitations during the day sitting up, compared to at night laying down.  For exercise, he goes on the bike three days a week with his wife. He does not experience angina when on the exercise bike. He also lifts more than 100 lbs, but he does not lift heavy frequently. He feels he was told to do no heavy lifting. I will review his  prior imaging given normal measurements on his CCTA from 2019.   He reports that he does get chest pressure which he attributes to his GI tract. He manages this with 'Gas-X.'   When he was at the hospital, he had a problem with stomach ulcer and he was put on lipitor. He then adds that he began to feel worse after taking the lipitor.   He reports that he will be seen soon by PCP to follow up pulmonary nodules and for a physical.   He has been taking Vitamin K2 supplements to manage his calcium, and Japanese soy beans. He remains compliant with Repatha and Crestor. He also takes Pepcid 20 mg for GERD.  Of note, his father has history of Emphysema and COPD due to working in the factory. 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  The patient denies shortness of breath, nocturnal dyspnea, orthopnea or peripheral edema. No lightheadedness or syncope.  Past Medical History:  Diagnosis Date   BPH (benign prostatic hyperplasia)    Chronic abdominal pain    Chronic insomnia    GERD (gastroesophageal reflux disease)    diet controlled   H/O ETOH abuse    Hernia, inguinal, left    History of colonoscopy 2004   History of endoscopy 2004   Hypertension    Irritable bowel disease    Prostatitis  Past Surgical History:  Procedure Laterality Date   ANTERIOR CRUCIATE LIGAMENT REPAIR Left 1978   BUNIONECTOMY     COLON MEDOFF     COLONOSCOPY     HEMORRHOIDAL BANDING  10/2014   HERNIA REPAIR Left 11/2011   INGUINAL HERNIA REPAIR  11/07/2011   Procedure: HERNIA REPAIR INGUINAL ADULT;  Surgeon: Kandis Cocking, MD;  Location: [facility];  Service: General;  Laterality: Left;  Left Inguinal Herniorrhaphy with Mesh   IRRIGATION AND DEBRIDEMENT ELBOW Right 05/22/2020   Procedure: IRRIGATION AND DEBRIDEMENT RIGHT ELBOW;  Surgeon: Jones Broom, MD;  Location: [facility];  Service: Orthopedics;  Laterality: Right;   IRRIGATION AND  DEBRIDEMENT ELBOW Right 09/18/2020   Procedure: IRRIGATION AND DEBRIDEMENT ELBOW WITH SUTURE REMOVAL;  Surgeon: Jones Broom, MD;  Location: [facility];  Service: Orthopedics;  Laterality: Right;   UPPER GASTROINTESTINAL ENDOSCOPY     WISDOM TOOTH EXTRACTION      Current Medications: Current Meds  Medication Sig   aspirin 81 MG EC tablet Take 81 mg by mouth at bedtime. Swallow whole.   busPIRone (BUSPAR) 5 MG tablet Take 5 mg by mouth 2 (two) times daily.   clonazePAM (KLONOPIN) 0.5 MG tablet Take 0.5 mg by mouth 2 (two) times daily as needed.   Evolocumab (REPATHA SURECLICK) 140 MG/ML SOAJ INJECT THE CONTENTS OF 1 SYRINGE INTO THE SKIN ONCE EVERY 14 DAYS   losartan (COZAAR) 25 MG tablet TAKE 1 TABLET BY MOUTH DAILY   Magnesium 200 MG TABS Take 1 tablet by mouth in the morning and at bedtime.   metoprolol tartrate (LOPRESSOR) 25 MG tablet Take 0.5 tablets (12.5 mg total) by mouth 2 (two) times daily.   Multiple Vitamin (MULTIVITAMIN WITH MINERALS) TABS tablet Take 1 tablet by mouth daily.   NON FORMULARY CPAP qhs   Pyridoxine HCl (VITAMIN B6 PO) Take 1 tablet by mouth daily.   rosuvastatin (CRESTOR) 5 MG tablet Take 5 mg by mouth at bedtime.    VITAMIN K PO Take by mouth.   [DISCONTINUED] Vitamin D-Vitamin K (VITAMIN K2-VITAMIN D3 PO) Take 1 tablet by mouth daily.     Allergies:   Augmentin [amoxicillin-pot clavulanate], Bacitracin, Balsam, Benzyl cinnamate, Betadine [povidone iodine], Clavulanic acid, Doxycycline, Gold sodium thiosulfate, Lanolin, Limonene, Linalool, Methylisothiazolinone, Other, Tea tree oil, and Neosporin [neomycin-bacitracin zn-polymyx]   Social History   Tobacco Use   Smoking status: Never   Smokeless tobacco: Never  Vaping Use   Vaping Use: Never used  Substance Use Topics   Alcohol use: No   Drug use: No     Family History: The patient's family history includes ADD / ADHD in his son and son; COPD in his father; Clotting disorder in his  mother; Diabetes in his brother; Heart attack in his brother; Heart disease in his mother; Hypercholesterolemia in his mother; Hypertension in his father and mother; Myelodysplastic syndrome in his mother; Obesity in his brother; Psoriasis in his brother.  ROS:   Please see the history of present illness. All other systems reviewed and are negative.  EKGs/Labs/Other Studies Reviewed:    The following studies were reviewed today:  EKG:  02/17/23 - sinus bradycardia.  02/04/2022 EKG: Rate 56. Sinus Bradycardia with PACs.   Imaging studies that I have independently reviewed today: CCTA 2019 and CTA 2018 - no dilation of aorta noted.  Recent Labs: 04/18/2022: ALT 21 04/19/2022: TSH 3.309 04/20/2022: BUN 13; Creatinine, Ser 1.12; Hemoglobin 13.5; Platelets 166; Potassium 4.0; Sodium 140  Recent Lipid Panel    Component Value Date/Time   CHOL 91 (L) 03/27/2018 0755   TRIG 43 03/27/2018 0755   HDL 62 03/27/2018 0755   CHOLHDL 1.5 03/27/2018 0755   CHOLHDL 3.1 06/18/2017 0459   VLDL 6 06/18/2017 0459   LDLCALC 20 03/27/2018 0755    Physical Exam:    VS:  BP 102/60   Pulse (!) 50   Ht 5' 3.5" (1.613 m)   Wt 161 lb 9.6 oz (73.3 kg)   SpO2 94%   BMI 28.18 kg/m     Wt Readings from Last 5 Encounters:  02/17/23 161 lb 9.6 oz (73.3 kg)  04/24/22 158 lb (71.7 kg)  04/19/22 155 lb (70.3 kg)  02/04/22 156 lb (70.8 kg)  02/01/21 159 lb (72.1 kg)    Constitutional: No acute distress Eyes: sclera non-icteric, normal conjunctiva and lids ENMT: normal dentition, moist mucous membranes Cardiovascular: regular rhythm, normal rate, no murmur. S1 and S2 normal. No jugular venous distention.  Respiratory: clear to auscultation bilaterally GI : normal bowel sounds, soft and nontender. No distention.   MSK: extremities warm, well perfused. No edema.  NEURO: grossly nonfocal exam, moves all extremities. PSYCH: alert and oriented x 3, normal mood and affect.   ASSESSMENT:    1. Essential  hypertension   2. Coronary artery disease involving native coronary artery of native heart without angina pectoris     PLAN:    Nonobstructive CAD: Moderate on CTA in May 2019. Ostial D1 with moderate lesion but FFR negative. 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 -Continue ASA 81mg  daily -Continue repatha 140mg  q2weeks -Continue crestor 5mg  daily   Palpitations: Resolved with metoprolol. Normal 30d event monitor. -Continue metop 12.5mg  BID--he would like to continue otherwise his HR races at night   HLD: Very well controlled with LDL 25. -Continue repatha 140mg  q2weeks -Continue crestor 5mg  daily - labs excellent.    HTN: Controlled and at goal <120s/80s. -Continue losartan 25mg  daily -Continue metop 12.5mg  BID  OSA - on CPAP new as of last visit.   Preoperative CV risk The patient is low risk for intermediate risk procedure.  No further cardiovascular testing is required prior to the procedure.  If this level of risk is acceptable to the patient and surgical team, the patient should be considered optimized from a cardiovascular standpoint.  Total time of encounter: 30 minutes total time of encounter, including 20 minutes spent in face-to-face patient care on the date of this encounter. This time includes coordination of care and counseling regarding above mentioned problem list. Remainder of non-face-to-face time involved reviewing chart documents/testing relevant to the patient encounter and documentation in the medical record. I have independently reviewed documentation from referring provider.   Weston Brass, MD, [facility]  [facility]    Shared Decision Making/Informed Consent:       Medication Adjustments/Labs and Tests Ordered: Current medicines are reviewed at length with the patient today.  Concerns regarding  medicines are outlined above.   Orders Placed This Encounter  Procedures   EKG 12-Lead    No orders of the defined types were placed in this encounter.   Patient Instructions  Medication Instructions:  Your physician recommends that you continue on your current medications as directed. Please refer to the Current Medication list given to you today.  *If you need a refill  on your cardiac medications before your next appointment, please call your pharmacy*   Follow-Up: At [facility], you and your health needs are our priority.  As part of our continuing mission to provide you with exceptional heart care, we have created designated Provider Care Teams.  These Care Teams include your primary Cardiologist (physician) and Advanced Practice Providers (APPs -  Physician Assistants and Nurse Practitioners) who all work together to provide you with the care you need, when you need it.  We recommend signing up for the patient portal called "MyChart".  Sign up information is provided on this After Visit Summary.  MyChart is used to connect with patients for Virtual Visits (Telemedicine).  Patients are able to view lab/test results, encounter notes, upcoming appointments, etc.  Non-urgent messages can be sent to your provider as well.   To learn more about what you can do with MyChart, go to [email or web address].    Your next appointment:   12 month(s)  Provider:   Parke Poisson, MD

## 2023-02-17 ENCOUNTER — Ambulatory Visit: Payer: Medicare Other | Attending: Internal Medicine | Admitting: Internal Medicine

## 2023-02-17 ENCOUNTER — Other Ambulatory Visit: Payer: Self-pay | Admitting: *Deleted

## 2023-02-17 ENCOUNTER — Ambulatory Visit: Payer: Commercial Managed Care - PPO | Admitting: Student

## 2023-02-17 ENCOUNTER — Encounter: Payer: Self-pay | Admitting: Internal Medicine

## 2023-02-17 VITALS — BP 102/60 | HR 50 | Ht 63.5 in | Wt 161.6 lb

## 2023-02-17 DIAGNOSIS — I10 Essential (primary) hypertension: Secondary | ICD-10-CM | POA: Insufficient documentation

## 2023-02-17 DIAGNOSIS — I2510 Atherosclerotic heart disease of native coronary artery without angina pectoris: Secondary | ICD-10-CM | POA: Diagnosis present

## 2023-02-17 MED ORDER — METOPROLOL TARTRATE 25 MG PO TABS: 12.5000 mg | ORAL_TABLET | Freq: Two times a day (BID) | ORAL | 3 refills | Status: DC

## 2023-02-17 NOTE — Patient Instructions (Signed)
Medication Instructions:  Your physician recommends that you continue on your current medications as directed. Please refer to the Current Medication list given to you today.  *If you need a refill on your cardiac medications before your next appointment, please call your pharmacy*   Follow-Up: At [facility], you and your health needs are our priority.  As part of our continuing mission to provide you with exceptional heart care, we have created designated Provider Care Teams.  These Care Teams include your primary Cardiologist (physician) and Advanced Practice Providers (APPs -  Physician Assistants and Nurse Practitioners) who all work together to provide you with the care you need, when you need it.  We recommend signing up for the patient portal called "MyChart".  Sign up information is provided on this After Visit Summary.  MyChart is used to connect with patients for Virtual Visits (Telemedicine).  Patients are able to view lab/test results, encounter notes, upcoming appointments, etc.  Non-urgent messages can be sent to your provider as well.   To learn more about what you can do with MyChart, go to [email or web address].    Your next appointment:   12 month(s)  Provider:   Gayatri A Acharya, MD    

## 2023-02-18 ENCOUNTER — Ambulatory Visit: Payer: Medicare Other | Admitting: Sports Medicine

## 2023-02-18 ENCOUNTER — Encounter: Payer: Self-pay | Admitting: Sports Medicine

## 2023-02-18 DIAGNOSIS — M7631 Iliotibial band syndrome, right leg: Secondary | ICD-10-CM | POA: Diagnosis not present

## 2023-02-18 DIAGNOSIS — M170 Bilateral primary osteoarthritis of knee: Secondary | ICD-10-CM | POA: Diagnosis not present

## 2023-02-18 NOTE — Progress Notes (Signed)
States he is doing better Able to bike further distances with less pain

## 2023-02-18 NOTE — Progress Notes (Signed)
   Office & Procedure Note  Patient: Eric Rhodes             Date of Birth: 10/31/1955           MRN: [id number]             Visit Date: 02/18/2023  HPI: Gregroy Lahaie Loyd is a pleasant 67 y.o. male who presents today for follow-up of right leg IT band syndrome.  He continues to have no pain of the IT band.  Still getting some pain over the lateral patella.  Tells me that he has a planned left knee arthroplasty on 02/28/2023.  Has been able to continue riding on his bike.  PE:  - Right leg/hip: No TTP over the greater trochanter or the IT band.  Negative Noble's compression test.  There is some patellar crepitus but good range of motion about the right knee.  There is near equivocal strength with lateral hip abduction which is improved from prior visit.  Procedures: Visit Diagnoses:  1. It band syndrome, right   2. Bilateral primary osteoarthritis of knee     Procedure: ECSWT Indications:  Distal IT-band syndrome   Procedure Details Consent: Risks of procedure as well as the alternatives and risks of each were explained to the patient.  Verbal consent for procedure obtained. Time Out: Verified patient identification, verified procedure, site was marked, verified correct patient position. The area was cleaned with alcohol swab.     The right distal and mid-IT band was targeted for Extracorporeal shockwave therapy.    Preset: Tendinitis/Tendinopathy Power Level: 120 mJ Frequency: 14 Hz Impulse/cycles: 3800 Head size: Regular   Patient tolerated procedure well without immediate complications.   Plan: - [name] responded extremely well to the extracorporeal shockwave treatment therapy, he has no more pain of the IT band.  Through shared decision-making, proceeded with 1 additional treatment of ESWT, we will discontinue treatment at this time given his improvement. -He will continue his home hip abduction exercises, IT band stretching and VMO strengthening to help balance the  hips and offload his patellofemoral joint of the knee -Will have him wait about 72 hours from today's treatment before resuming Celebrex -He will follow-up with me as needed -wished him the best for his upcoming left knee replacement  Madelyn Brunner, DO Primary Care Sports Medicine Physician  [facility] - Orthopedics  This note was dictated using Dragon naturally speaking software and may contain errors in syntax, spelling, or content which have not been identified prior to signing this note.

## 2023-02-26 ENCOUNTER — Encounter: Payer: Self-pay | Admitting: Sports Medicine

## 2023-02-28 ENCOUNTER — Ambulatory Visit: Payer: Commercial Managed Care - PPO | Admitting: Internal Medicine

## 2023-03-12 ENCOUNTER — Telehealth: Payer: Self-pay | Admitting: *Deleted

## 2023-03-12 ENCOUNTER — Other Ambulatory Visit: Payer: Self-pay | Admitting: Internal Medicine

## 2023-03-12 NOTE — Telephone Encounter (Signed)
   Pre-operative Risk Assessment    Patient Name: Eric Rhodes  DOB: 29-Jan-1956 MRN: [id number]      Request for Surgical Clearance    Procedure:   RIGHT TOTAL KNEE ARTHROPLASTY  Date of Surgery:  Clearance TBD                                 Surgeon:  Gean Birchwood, MD Surgeon's Group or Practice Name:  [facility] Phone number:  [phone] Fax number:  [phone]   Type of Clearance Requested:   - Medical  - Pharmacy:  Hold Aspirin NOT INDICATED HOW LONG   Type of Anesthesia:  Spinal   Additional requests/questions:    Wilhemina Cash   03/12/2023, 4:08 PM

## 2023-03-13 ENCOUNTER — Encounter: Payer: Self-pay | Admitting: Internal Medicine

## 2023-03-14 NOTE — Telephone Encounter (Addendum)
   Patient Name: Eric Rhodes  DOB: 1956/01/14 MRN: [id number]  Primary Cardiologist: Parke Poisson, MD  Chart reviewed as part of pre-operative protocol coverage. Pre-op clearance already addressed by colleagues in earlier phone notes. To summarize recommendations:  -Preoperative CV risk The patient is low risk for intermediate risk procedure.  No further cardiovascular testing is required prior to the procedure.  If this level of risk is acceptable to the patient and surgical team, the patient should be considered optimized from a cardiovascular standpoint. -Dr. Vinetta Bergamo to hold aspirin x 5 days prior to the procedure.  Please restart when medically safe to do so.  Will route this bundled recommendation to requesting provider via Epic fax function and remove from pre-op pool. Please call with questions.  Sharlene Dory, PA-C 03/14/2023, 10:13 AM

## 2023-03-21 ENCOUNTER — Encounter: Payer: Self-pay | Admitting: Podiatry

## 2023-03-21 ENCOUNTER — Ambulatory Visit (INDEPENDENT_AMBULATORY_CARE_PROVIDER_SITE_OTHER): Payer: Medicare Other | Admitting: Podiatry

## 2023-03-21 DIAGNOSIS — D2371 Other benign neoplasm of skin of right lower limb, including hip: Secondary | ICD-10-CM

## 2023-03-21 NOTE — Progress Notes (Signed)
  Subjective:  Patient ID: Eric Rhodes, male    DOB: 1955-12-20,   MRN: [id number]  Chief Complaint  Patient presents with   Callouses    Right foot callus     67 y.o. male presents for concern of right foot lesion that has been present for a while. He has tried trimming back himself and has used compound W on the area and relief some. He has recently had knee surgery on the left and awaiting surgery on the right. Has difficulty getting down  . Denies any other pedal complaints. Denies n/v/f/c.   Past Medical History:  Diagnosis Date   BPH (benign prostatic hyperplasia)    Chronic abdominal pain    Chronic insomnia    GERD (gastroesophageal reflux disease)    diet controlled   H/O ETOH abuse    Hernia, inguinal, left    History of colonoscopy 2004   History of endoscopy 2004   Hypertension    Irritable bowel disease    Prostatitis     Objective:  Physical Exam: Vascular: DP/PT pulses 2/4 bilateral. CFT <3 seconds. Normal hair growth on digits. No edema.  Skin. No lacerations or abrasions bilateral feet. Hyperkeratotic cored lesion noted to plantar second metatarsal on the right with disruption of skin lines noted.  Musculoskeletal: MMT 5/5 bilateral lower extremities in DF, PF, Inversion and Eversion. Deceased ROM in DF of ankle joint.  Neurological: Sensation intact to light touch.   Assessment:   1. Benign neoplasm of skin of right foot      Plan:  Patient was evaluated and treated and all questions answered. -Discussed  benign skin lesions  with patient and treatment options.  -Hyperkeratotic tissue was debrided with chisel without incident.  -Applied salycylic acid treatment to area with dressing. Advised to remove bandaging tomorrow.  -Encouraged daily moisturizing -Discussed use of pumice stone -Advised good supportive shoes and inserts -Patient to return to office as needed or sooner if condition worsens.   Louann Sjogren, DPM

## 2023-03-27 ENCOUNTER — Other Ambulatory Visit (HOSPITAL_COMMUNITY): Payer: Self-pay | Admitting: Orthopedic Surgery

## 2023-03-27 DIAGNOSIS — M25562 Pain in left knee: Secondary | ICD-10-CM

## 2023-03-27 DIAGNOSIS — Z96652 Presence of left artificial knee joint: Secondary | ICD-10-CM

## 2023-03-28 ENCOUNTER — Ambulatory Visit (HOSPITAL_COMMUNITY)
Admission: RE | Admit: 2023-03-28 | Discharge: 2023-03-28 | Disposition: A | Discharge status: Home / Self Care | Payer: Medicare Other | Source: Ambulatory Visit | Attending: Orthopedic Surgery | Admitting: Orthopedic Surgery

## 2023-03-28 DIAGNOSIS — Z96652 Presence of left artificial knee joint: Secondary | ICD-10-CM | POA: Diagnosis not present

## 2023-03-28 DIAGNOSIS — M25562 Pain in left knee: Secondary | ICD-10-CM | POA: Diagnosis not present

## 2023-03-28 DIAGNOSIS — M79662 Pain in left lower leg: Secondary | ICD-10-CM | POA: Insufficient documentation

## 2023-04-10 ENCOUNTER — Ambulatory Visit: Payer: Medicare Other

## 2023-04-10 ENCOUNTER — Ambulatory Visit: Payer: Medicare Other | Admitting: Podiatry

## 2023-04-10 DIAGNOSIS — D2371 Other benign neoplasm of skin of right lower limb, including hip: Secondary | ICD-10-CM

## 2023-04-16 NOTE — Progress Notes (Signed)
Subjective:   Patient ID: Eric Rhodes, male   DOB: 67 y.o.   MRN: [id number]   HPI No chief complaint on file.  67 year old male presents the office today for concerns of a small spot on his right foot is causing pain.  Not sure if this is a wart or callus.  Denies any recent injuries.  No swelling redness or any drainage.  He has no new concerns today.   Review of Systems  All other systems reviewed and are negative.   Past Medical History:  Diagnosis Date   BPH (benign prostatic hyperplasia)    Chronic abdominal pain    Chronic insomnia    GERD (gastroesophageal reflux disease)    diet controlled   H/O ETOH abuse    Hernia, inguinal, left    History of colonoscopy 2004   History of endoscopy 2004   Hypertension    Irritable bowel disease    Prostatitis     Past Surgical History:  Procedure Laterality Date   ANTERIOR CRUCIATE LIGAMENT REPAIR Left 1978   BUNIONECTOMY     COLON MEDOFF     COLONOSCOPY     HEMORRHOIDAL BANDING  10/2014   HERNIA REPAIR Left 11/2011   INGUINAL HERNIA REPAIR  11/07/2011   Procedure: HERNIA REPAIR INGUINAL ADULT;  Surgeon: Kandis Cocking, MD;  Location: [facility];  Service: General;  Laterality: Left;  Left Inguinal Herniorrhaphy with Mesh   IRRIGATION AND DEBRIDEMENT ELBOW Right 05/22/2020   Procedure: IRRIGATION AND DEBRIDEMENT RIGHT ELBOW;  Surgeon: Jones Broom, MD;  Location: [facility] OR;  Service: Orthopedics;  Laterality: Right;   IRRIGATION AND DEBRIDEMENT ELBOW Right 09/18/2020   Procedure: IRRIGATION AND DEBRIDEMENT ELBOW WITH SUTURE REMOVAL;  Surgeon: Jones Broom, MD;  Location: [facility];  Service: Orthopedics;  Laterality: Right;   UPPER GASTROINTESTINAL ENDOSCOPY     WISDOM TOOTH EXTRACTION       Current Outpatient Medications:    aspirin 81 MG EC tablet, Take 81 mg by mouth at bedtime. Swallow whole., Disp: , Rfl:    busPIRone (BUSPAR) 5 MG tablet, Take 5 mg by mouth 2 (two) times  daily., Disp: , Rfl:    clonazePAM (KLONOPIN) 0.5 MG tablet, Take 0.5 mg by mouth 2 (two) times daily as needed., Disp: , Rfl:    Evolocumab (REPATHA SURECLICK) 140 MG/ML SOAJ, INJECT THE CONTENTS OF 1 SYRINGE INTO THE SKIN ONCE EVERY 14 DAYS, Disp: 6 mL, Rfl: 3   losartan (COZAAR) 25 MG tablet, TAKE 1 TABLET BY MOUTH DAILY, Disp: 90 tablet, Rfl: 3   Magnesium 200 MG TABS, Take 1 tablet by mouth in the morning and at bedtime., Disp: , Rfl:    metoprolol tartrate (LOPRESSOR) 25 MG tablet, Take 0.5 tablets (12.5 mg total) by mouth 2 (two) times daily., Disp: 90 tablet, Rfl: 3   Multiple Vitamin (MULTIVITAMIN WITH MINERALS) TABS tablet, Take 1 tablet by mouth daily., Disp: , Rfl:    NON FORMULARY, CPAP qhs, Disp: , Rfl:    Pyridoxine HCl (VITAMIN B6 PO), Take 1 tablet by mouth daily., Disp: , Rfl:    rosuvastatin (CRESTOR) 5 MG tablet, Take 5 mg by mouth at bedtime. , Disp: , Rfl:    VITAMIN K PO, Take by mouth., Disp: , Rfl:   Allergies  Allergen Reactions   Augmentin [Amoxicillin-Pot Clavulanate] Other (See Comments)    Causes GI cramps   Bacitracin Other (See Comments)    Positive patch test   Lucilla Lame Other (  See Comments)    Positive patch test   Benzyl Cinnamate Other (See Comments)    Positive patch test   Betadine [Povidone Iodine] Other (See Comments)    Skin sensitive   Clavulanic Acid Other (See Comments)    GI Cramps   Doxycycline Hives   Gold Sodium Thiosulfate Other (See Comments)    Positive patch test   Lanolin Other (See Comments)    Positive patch test   Limonene Other (See Comments)    Positive patch test   Linalool Other (See Comments)    Positive patch test   Methylisothiazolinone Other (See Comments)    Positive patch test     Other Other (See Comments)    Cinnamic Alcohol-Positive patch test Cinnamic Aldehyde-Positive patch test Compositae Mix-Positive patch test Dimethylaminopropylamine-Positive patch test Disperse Dye Mix-Positive patch  test Quaternium-15-Positive patch test Fragrance mix I-Positive patch test Fragrance mix II-Positive patch test   Tea Tree Oil Other (See Comments)    Positive patch test   Neosporin [Neomycin-Bacitracin Zn-Polymyx] Rash    Contact dermatitis          Objective:  Physical Exam  General: AAO x3, NAD  Dermatological: On the right foot plantarly there is a small annular hyperkeratotic lesion.  Maybe some pinpoint bleeding consistent verruca.  No edema, erythema or evidence of foreign body.  Vascular: Dorsalis Pedis artery and Posterior Tibial artery pedal pulses are 2/4 bilateral with immedate capillary fill time.  There is no pain with calf compression, swelling, warmth, erythema.   Neruologic: Grossly intact via light touch bilateral.  Musculoskeletal: No other areas of discomfort.  Assessment:   Skin lesion, likely verruca right foot     Plan:  -Treatment options discussed including all alternatives, risks, and complications -Etiology of symptoms were discussed -Separate debrided lesion.  Minimal bleeding occurred.  Cleaned the area was applied.  He was to hold off on any Other Medications Due To Upcoming Knee Replacement.  Will Likely Apply This Later If Needed If Symptoms Persist.  Vivi Barrack DPM

## 2023-04-21 ENCOUNTER — Other Ambulatory Visit (HOSPITAL_BASED_OUTPATIENT_CLINIC_OR_DEPARTMENT_OTHER): Payer: Self-pay

## 2023-04-21 ENCOUNTER — Emergency Department (HOSPITAL_BASED_OUTPATIENT_CLINIC_OR_DEPARTMENT_OTHER): Payer: Medicare Other

## 2023-04-21 ENCOUNTER — Encounter (HOSPITAL_BASED_OUTPATIENT_CLINIC_OR_DEPARTMENT_OTHER): Payer: Self-pay

## 2023-04-21 ENCOUNTER — Emergency Department (HOSPITAL_BASED_OUTPATIENT_CLINIC_OR_DEPARTMENT_OTHER)
Admission: EM | Admit: 2023-04-21 | Discharge: 2023-04-21 | Disposition: A | Discharge status: Home / Self Care | Payer: Medicare Other | Attending: Emergency Medicine | Admitting: Emergency Medicine

## 2023-04-21 ENCOUNTER — Other Ambulatory Visit: Payer: Self-pay

## 2023-04-21 DIAGNOSIS — R197 Diarrhea, unspecified: Secondary | ICD-10-CM | POA: Insufficient documentation

## 2023-04-21 DIAGNOSIS — R1084 Generalized abdominal pain: Secondary | ICD-10-CM | POA: Diagnosis not present

## 2023-04-21 DIAGNOSIS — R109 Unspecified abdominal pain: Secondary | ICD-10-CM

## 2023-04-21 DIAGNOSIS — Z7982 Long term (current) use of aspirin: Secondary | ICD-10-CM | POA: Insufficient documentation

## 2023-04-21 LAB — CBC WITH DIFFERENTIAL/PLATELET
Abs Immature Granulocytes: 0.06 10*3/uL (ref 0.00–0.07)
Basophils Absolute: 0 10*3/uL (ref 0.0–0.1)
Basophils Relative: 1 %
Eosinophils Absolute: 0.2 10*3/uL (ref 0.0–0.5)
Eosinophils Relative: 2 %
HCT: 45 % (ref 39.0–52.0)
Hemoglobin: 15.5 g/dL (ref 13.0–17.0)
Immature Granulocytes: 1 %
Lymphocytes Relative: 14 %
Lymphs Abs: 1 10*3/uL (ref 0.7–4.0)
MCH: 31.3 pg (ref 26.0–34.0)
MCHC: 34.4 g/dL (ref 30.0–36.0)
MCV: 90.9 fL (ref 80.0–100.0)
Monocytes Absolute: 0.4 10*3/uL (ref 0.1–1.0)
Monocytes Relative: 5 %
Neutro Abs: 5.8 10*3/uL (ref 1.7–7.7)
Neutrophils Relative %: 77 %
Platelets: 256 10*3/uL (ref 150–400)
RBC: 4.95 MIL/uL (ref 4.22–5.81)
RDW: 12.5 % (ref 11.5–15.5)
WBC: 7.4 10*3/uL (ref 4.0–10.5)
nRBC: 0 % (ref 0.0–0.2)

## 2023-04-21 LAB — COMPREHENSIVE METABOLIC PANEL
ALT: 16 U/L (ref 0–44)
AST: 15 U/L (ref 15–41)
Albumin: 4.5 g/dL (ref 3.5–5.0)
Alkaline Phosphatase: 52 U/L (ref 38–126)
Anion gap: 11 (ref 5–15)
BUN: 15 mg/dL (ref 8–23)
CO2: 25 mmol/L (ref 22–32)
Calcium: 9.5 mg/dL (ref 8.9–10.3)
Chloride: 102 mmol/L (ref 98–111)
Creatinine, Ser: 0.99 mg/dL (ref 0.61–1.24)
GFR, Estimated: >60 mL/min (ref >60)
Glucose, Bld: 121 mg/dL — ABNORMAL HIGH (ref 70–99)
Potassium: 4 mmol/L (ref 3.5–5.1)
Sodium: 138 mmol/L (ref 135–145)
Total Bilirubin: 1.2 mg/dL (ref 0.3–1.2)
Total Protein: 7.1 g/dL (ref 6.5–8.1)

## 2023-04-21 LAB — URINALYSIS, ROUTINE W REFLEX MICROSCOPIC
Bilirubin Urine: NEGATIVE
Glucose, UA: NEGATIVE mg/dL
Hgb urine dipstick: NEGATIVE
Ketones, ur: NEGATIVE mg/dL
Leukocytes,Ua: NEGATIVE
Nitrite: NEGATIVE
Protein, ur: NEGATIVE mg/dL
Specific Gravity, Urine: <1.005 — ABNORMAL LOW (ref 1.005–1.030)
pH: 5.5 (ref 5.0–8.0)

## 2023-04-21 LAB — LIPASE, BLOOD: Lipase: 26 U/L (ref 11–51)

## 2023-04-21 LAB — LACTIC ACID, PLASMA: Lactic Acid, Venous: 1 mmol/L (ref 0.5–1.9)

## 2023-04-21 MED ORDER — IOHEXOL 300 MG/ML  SOLN
100.0000 mL | Freq: Once | INTRAMUSCULAR | Status: AC | PRN
  Administered 2023-04-21: 75 mL via INTRAVENOUS

## 2023-04-21 MED ORDER — SODIUM CHLORIDE 0.9 % IV BOLUS
1000.0000 mL | Freq: Once | INTRAVENOUS | Status: AC
  Administered 2023-04-21: 1000 mL via INTRAVENOUS

## 2023-04-21 MED ORDER — PROMETHAZINE HCL 25 MG PO TABS
25.0000 mg | ORAL_TABLET | Freq: Three times a day (TID) | ORAL | 0 refills | Status: DC | PRN
Start: 2023-04-21 — End: 2023-05-09

## 2023-04-21 MED ORDER — ONDANSETRON HCL 4 MG/2ML IJ SOLN
4.0000 mg | Freq: Once | INTRAMUSCULAR | Status: AC
  Administered 2023-04-21: 4 mg via INTRAVENOUS
  Filled 2023-04-21: qty 2

## 2023-04-21 NOTE — ED Notes (Signed)
Discharge instructions, follow up care, and prescription reviewed and explained, pt verbalized understanding and had no further questions on d/c. Pt caox4, ambulatory, NAD on d/c.  

## 2023-04-21 NOTE — ED Provider Notes (Signed)
[facility]ERGENC[facility] DEPARTMENT AT [facility] Provider Note   CSN: [id number] Arrival date & time: 04/21/23  9528     History  No chief complaint on file.   Eric Rhodes is a 67 y.o. male.  The history is provided by the patient and medical records. No language interpreter was used.  Abdominal Pain Pain location:  Generalized Pain quality: aching, bloating, cramping and dull   Pain radiates to:  Does not radiate Pain severity:  Severe Duration:  3 days Timing:  Intermittent Progression:  Waxing and waning Chronicity:  Recurrent Context: previous surgery   Context: not trauma   Relieved by:  Nothing Worsened by:  Nothing Ineffective treatments:  None tried Associated symptoms: belching, constipation, diarrhea, fatigue and nausea   Associated symptoms: no anorexia, no chest pain, no chills, no cough, no dysuria, no fever, no shortness of breath and no vomiting        Home Medications Prior to Admission medications   Medication Sig Start Date End Date Taking? Authorizing Provider  aspirin 81 MG EC tablet Take 81 mg by mouth at bedtime. Swallow whole.    [provider]  busPIRone (BUSPAR) 5 MG tablet Take 5 mg by mouth 2 (two) times daily. 11/27/22   [provider]  clonazePAM (KLONOPIN) 0.5 MG tablet Take 0.5 mg by mouth 2 (two) times daily as needed. 03/27/22   [provider]  Evolocumab (REPATHA SURECLICK) 140 MG/ML SOAJ INJECT THE CONTENTS OF 1 SYRINGE INTO THE SKIN ONCE EVERY 14 DAYS 08/22/22   Meriam Sprague, MD  losartan (COZAAR) 25 MG tablet TAKE 1 TABLET BY MOUTH DAILY 02/19/22   Parke Poisson, MD  Magnesium 200 MG TABS Take 1 tablet by mouth in the morning and at bedtime.    [provider]  metoprolol tartrate (LOPRESSOR) 25 MG tablet Take 0.5 tablets (12.5 mg total) by mouth 2 (two) times daily. 02/17/23   Parke Poisson, MD  Multiple Vitamin (MULTIVITAMIN WITH MINERALS) TABS tablet Take 1 tablet  by mouth daily.    [provider]  NON FORMULARY CPAP qhs    [provider]  Pyridoxine HCl (VITAMIN B6 PO) Take 1 tablet by mouth daily.    [provider]  rosuvastatin (CRESTOR) 5 MG tablet Take 5 mg by mouth at bedtime.  02/12/19   [provider]  VITAMIN K PO Take by mouth.    [provider]      Allergies    Augmentin [amoxicillin-pot clavulanate], Bacitracin, Balsam, Benzyl cinnamate, Betadine [povidone iodine], Clavulanic acid, Doxycycline, Gold sodium thiosulfate, Lanolin, Limonene, Linalool, Methylisothiazolinone, Other, Tea tree oil, and Neosporin [neomycin-bacitracin zn-polymyx]    Review of Systems   Review of Systems  Constitutional:  Positive for fatigue. Negative for chills and fever.  HENT:  Negative for congestion.   Respiratory:  Negative for cough, chest tightness, shortness of breath and wheezing.   Cardiovascular:  Negative for chest pain, palpitations and leg swelling.  Gastrointestinal:  Positive for abdominal pain, constipation, diarrhea and nausea. Negative for abdominal distention, anorexia and vomiting.  Genitourinary:  Negative for dysuria, flank pain and frequency.  Musculoskeletal:  Negative for back pain, neck pain and neck stiffness.  Skin:  Positive for wound. Negative for rash.  Neurological:  Positive for light-headedness. Negative for headaches.  Psychiatric/Behavioral:  Negative for agitation and confusion.   All other systems reviewed and are negative.   Physical Exam Updated Vital Signs There were no vitals taken for this  visit. Physical Exam Vitals and nursing note reviewed.  Constitutional:      General: He is not in acute distress.    Appearance: He is well-developed. He is not ill-appearing, toxic-appearing or diaphoretic.  HENT:     Head: Normocephalic and atraumatic.     Nose: No congestion or rhinorrhea.     Mouth/Throat:     Mouth: Mucous membranes are dry.  Eyes:      Conjunctiva/sclera: Conjunctivae normal.  Cardiovascular:     Rate and Rhythm: Normal rate and regular rhythm.     Heart sounds: No murmur heard. Pulmonary:     Effort: Pulmonary effort is normal. No respiratory distress.     Breath sounds: Normal breath sounds. No wheezing, rhonchi or rales.  Chest:     Chest wall: No tenderness.  Abdominal:     Palpations: Abdomen is soft.     Tenderness: There is abdominal tenderness. There is no guarding or rebound.  Musculoskeletal:        General: No swelling or tenderness.     Cervical back: Neck supple. No tenderness.     Right lower leg: No edema.     Left lower leg: No edema.  Skin:    General: Skin is warm and dry.     Capillary Refill: Capillary refill takes less than 2 seconds.     Findings: No erythema or rash.  Neurological:     General: No focal deficit present.     Mental Status: He is alert.  Psychiatric:        Mood and Affect: Mood normal.     ED Results / Procedures / Treatments   Labs (all labs ordered are listed, but only abnormal results are displayed) Labs Reviewed  COMPREHENSIVE METABOLIC PANEL - Abnormal; Notable for the following components:      Result Value   Glucose, Bld 121 (*)    All other components within normal limits  URINALYSIS, ROUTINE W REFLEX MICROSCOPIC - Abnormal; Notable for the following components:   Color, Urine COLORLESS (*)    Specific Gravity, Urine <1.005 (*)    All other components within normal limits  C DIFFICILE QUICK SCREEN W PCR REFLEX    CBC WITH DIFFERENTIAL/PLATELET  LACTIC ACID, PLASMA  LIPASE, BLOOD    EKG None  Radiology CT ABDOMEN PELVIS W CONTRAST  Result Date: 04/21/2023 CLINICAL DATA:  Abdominal pain. Diffuse abdominal pain nonlocalized EXAM: CT ABDOMEN AND PELVIS WITH CONTRAST TECHNIQUE: Multidetector CT imaging of the abdomen and pelvis was performed using the standard protocol following bolus administration of intravenous contrast. RADIATION DOSE REDUCTION: This  exam was performed according to the departmental dose-optimization program which includes automated exposure control, adjustment of the mA and/or kV according to patient size and/or use of iterative reconstruction technique. CONTRAST:  75mL OMNIPAQUE IOHEXOL 300 MG/ML  SOLN COMPARISON:  None Available. FINDINGS: Lower chest: Lung bases are clear. Hepatobiliary: No focal hepatic lesion. Normal gallbladder. No biliary duct dilatation. Common bile duct is normal. Pancreas: Pancreas is normal. No ductal dilatation. No pancreatic inflammation. Spleen: Normal spleen Adrenals/urinary tract: Adrenal glands and kidneys are normal. The ureters and bladder normal. Stomach/Bowel: Stomach, small bowel, appendix, and cecum are normal. The colon and rectosigmoid colon are normal. Vascular/Lymphatic: Abdominal aorta is normal caliber. No periportal or retroperitoneal adenopathy. No pelvic adenopathy. Reproductive: Prostate unremarkable Other: No free fluid. Musculoskeletal: No aggressive osseous lesion. IMPRESSION: 1. No acute findings in the abdomen pelvis. 2. Normal appendix. 3. Normal gallbladder. Electronically Signed  By: Genevive Bi M.D.   On: 04/21/2023 14:17    Procedures Procedures    Medications Ordered in ED Medications  ondansetron (ZOFRAN) injection 4 mg (4 mg Intravenous Given 04/21/23 1011)  sodium chloride 0.9 % bolus 1,000 mL (0 mLs Intravenous Stopped 04/21/23 1112)  iohexol (OMNIPAQUE) 300 MG/ML solution 100 mL (75 mLs Intravenous Contrast Given 04/21/23 1117)    ED Course/ Medical Decision Making/ A&P                                 Medical Decision Making Amount and/or Complexity of Data Reviewed Labs: ordered. Radiology: ordered.  Risk Prescription drug management.    Eric Rhodes is a 67 y.o. male with a past medical history significant for previous inguinal hernia status post repair, CAD, GERD, hyperlipidemia, hypertension, previous prostatitis, BPH, atrial valve disease,  and recent antibiotic use after left knee wound infection who presents with several days of worsening and recurrent abdominal discomfort, nausea, diarrhea.  According to patient, he is on antibiotics recently for a left knee surgery that had a possible infection at the surgical site.  That is since improved but since then over the last month or 2 he has been having GI problems with intermittent constipation and diarrhea.  He had been doing better but it continues to wax and wane.  He was scheduled to go see someone [facility] for some bacterial overgrowth in his GI tract testing.  He said that for the last 3 days it has again worsened and is now having diarrhea every day with more pain in his abdomen.  He describes as of 8 out of 10 in severity.  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  He denies history of diverticulitis or diverticulosis.  He is complaining of discomfort now.  He reports no fevers, chills, chest pain, shortness of breath.  Denies any urinary changes.  Denies any back or flank pains.  On exam, lungs clear.  Chest nontender.  Abdomen was tender with him grimacing diffusely.  Bowel sounds were vigorous and appreciated.  Back and flanks nontender.  He denied any testicle discomfort or testicle symptoms so initial GU exam deferred.  He had pulses in lower extremities.  Patient has dry mucous membranes.  Clinically I suspect this could be due to some of his GI bacterial troubles and his history of irritable bowel syndrome however, we agreed to get some screening labs, give him some fluids for what I suspect is some degree of dehydration, but also get imaging to rule out concerning pathology such as new mass, obstruction, diverticulitis, or other cause.  We will get urinalysis as well and give him some IV Zofran.  We will also send off a C. difficile given his recent   antibiotic use and the development of the diarrhea.  Anticipate reassessment after  workup to determine disposition.  Workup returned overall reassuring.  He was finally able to give [place] a stool sample and it was sent off to rule out C. difficile.  However, the rest of the workup was reassuring.  CBC and CMP reassuring.  UDS shows no evidence of UTI.  Lipase not elevated.  Lactic is normal.  CT imaging shows no concerning findings.  Given improvement in symptoms and patient's well appearance, we do feel he is safe for discharge home.  He can follow-up on the results of  the C. difficile test with his PCP and GI teams and something was positive anticipate he would likely get a call.  Patient reports he has had some constipation with the Zofran and once on the differential we will give a prescription for Phenergan to try and he will follow-up with his PCP and GI teams.  He agreed with plan of care and had no other questions or concerns and was discharged in good condition to continue outpatient workup.         Final Clinical Impression(s) / ED Diagnoses Final diagnoses:  Diarrhea, unspecified type  Abdominal pain, unspecified abdominal location    Rx / DC Orders ED Discharge Orders          Ordered    promethazine (PHENERGAN) 25 MG tablet  Every 8 hours PRN        04/21/23 1454           Clinical Impression: 1. Diarrhea, unspecified type   2. Abdominal pain, unspecified abdominal location     Disposition: Discharge  Condition: Good  I have discussed the results, Dx and Tx plan with the pt(& family if present). He/she/they expressed understanding and agree(s) with the plan. Discharge instructions discussed at great length. Strict return precautions discussed and pt &/or family have verbalized understanding of the instructions. No further questions at time of discharge.    Discharge Medication List as of 04/21/2023  2:56 PM     START taking these medications   Details  promethazine (PHENERGAN) 25 MG tablet Take 1 tablet (25 mg total) by mouth every 8  (eight) hours as needed for nausea or vomiting., Starting Mon 04/21/2023, Print        Follow Up: Sigmund Hazel, MD [address] [place] [address] [phone]     your GI team and your orthopedic surgical teams     [facility] Emergency Department at [facility] [address] [place] [address] [phone]       Shaylen Nephew, Canary Brim, MD 04/21/23 1530

## 2023-04-21 NOTE — ED Triage Notes (Signed)
Pt caox4, ambulatory c/o "GI distress off and on for the past month," pt specifies nausea, diarrhea, constipation, and abdominal cramping. Pt denies vomiting in the past 24 hrs but states he had diarrhea this morning. Denies pain at present.

## 2023-04-21 NOTE — Discharge Instructions (Signed)
Your history, exam, workup today led [place] to do a broad workup including CT imaging that was overall reassuring.  Your labs were overall reassuring and we try to send off the C. difficile test for you as well.  If this is positive, they would likely call you in an antibiotic for it however please try to follow-up on MyChart to confirm the result.  Please also follow-up with your primary doctor, your GI team, and your surgical team given the upcoming surgery.  Please rest and stay hydrated.  As the Zofran tended to constipate you, please consider switching to the Phenergan as better for you.  If any symptoms change or worsen acutely, please return to nearest emergency department.

## 2023-04-22 LAB — C DIFFICILE QUICK SCREEN W PCR REFLEX
C Diff antigen: NEGATIVE
C Diff interpretation: NOT DETECTED
C Diff toxin: NEGATIVE

## 2023-05-09 ENCOUNTER — Emergency Department (HOSPITAL_BASED_OUTPATIENT_CLINIC_OR_DEPARTMENT_OTHER)
Admission: EM | Admit: 2023-05-09 | Discharge: 2023-05-10 | Disposition: A | Discharge status: Home / Self Care | Payer: Medicare Other | Attending: Emergency Medicine | Admitting: Emergency Medicine

## 2023-05-09 ENCOUNTER — Emergency Department (HOSPITAL_BASED_OUTPATIENT_CLINIC_OR_DEPARTMENT_OTHER): Payer: Medicare Other

## 2023-05-09 ENCOUNTER — Encounter (HOSPITAL_BASED_OUTPATIENT_CLINIC_OR_DEPARTMENT_OTHER): Payer: Self-pay | Admitting: Emergency Medicine

## 2023-05-09 DIAGNOSIS — Z79899 Other long term (current) drug therapy: Secondary | ICD-10-CM | POA: Diagnosis not present

## 2023-05-09 DIAGNOSIS — R1084 Generalized abdominal pain: Secondary | ICD-10-CM | POA: Insufficient documentation

## 2023-05-09 DIAGNOSIS — I10 Essential (primary) hypertension: Secondary | ICD-10-CM | POA: Insufficient documentation

## 2023-05-09 DIAGNOSIS — R109 Unspecified abdominal pain: Secondary | ICD-10-CM

## 2023-05-09 DIAGNOSIS — Z7982 Long term (current) use of aspirin: Secondary | ICD-10-CM | POA: Insufficient documentation

## 2023-05-09 DIAGNOSIS — I2510 Atherosclerotic heart disease of native coronary artery without angina pectoris: Secondary | ICD-10-CM | POA: Insufficient documentation

## 2023-05-09 DIAGNOSIS — R197 Diarrhea, unspecified: Secondary | ICD-10-CM

## 2023-05-09 LAB — LIPASE, BLOOD: Lipase: 32 U/L (ref 11–51)

## 2023-05-09 LAB — URINALYSIS, ROUTINE W REFLEX MICROSCOPIC
Bilirubin Urine: NEGATIVE
Glucose, UA: NEGATIVE mg/dL
Hgb urine dipstick: NEGATIVE
Leukocytes,Ua: NEGATIVE
Nitrite: NEGATIVE
Protein, ur: NEGATIVE mg/dL
Specific Gravity, Urine: 1.012 (ref 1.005–1.030)
pH: 5.5 (ref 5.0–8.0)

## 2023-05-09 LAB — COMPREHENSIVE METABOLIC PANEL
ALT: 19 U/L (ref 0–44)
AST: 15 U/L (ref 15–41)
Albumin: 4.7 g/dL (ref 3.5–5.0)
Alkaline Phosphatase: 57 U/L (ref 38–126)
Anion gap: 11 (ref 5–15)
BUN: 23 mg/dL (ref 8–23)
CO2: 27 mmol/L (ref 22–32)
Calcium: 9.3 mg/dL (ref 8.9–10.3)
Chloride: 99 mmol/L (ref 98–111)
Creatinine, Ser: 1.14 mg/dL (ref 0.61–1.24)
GFR, Estimated: >60 mL/min (ref >60)
Glucose, Bld: 116 mg/dL — ABNORMAL HIGH (ref 70–99)
Potassium: 4.4 mmol/L (ref 3.5–5.1)
Sodium: 137 mmol/L (ref 135–145)
Total Bilirubin: 1.2 mg/dL (ref 0.3–1.2)
Total Protein: 7 g/dL (ref 6.5–8.1)

## 2023-05-09 LAB — CBC
HCT: 41.3 % (ref 39.0–52.0)
Hemoglobin: 13.9 g/dL (ref 13.0–17.0)
MCH: 31 pg (ref 26.0–34.0)
MCHC: 33.7 g/dL (ref 30.0–36.0)
MCV: 92 fL (ref 80.0–100.0)
Platelets: 353 10*3/uL (ref 150–400)
RBC: 4.49 MIL/uL (ref 4.22–5.81)
RDW: 12.6 % (ref 11.5–15.5)
WBC: 11.8 10*3/uL — ABNORMAL HIGH (ref 4.0–10.5)
nRBC: 0 % (ref 0.0–0.2)

## 2023-05-09 MED ORDER — PROMETHAZINE HCL 25 MG PO TABS: 25.0000 mg | ORAL_TABLET | Freq: Three times a day (TID) | ORAL | 0 refills | Status: DC | PRN

## 2023-05-09 MED ORDER — PROMETHAZINE HCL 25 MG/ML IJ SOLN
INTRAMUSCULAR | Status: AC
  Filled 2023-05-09: qty 1

## 2023-05-09 MED ORDER — DICYCLOMINE HCL 10 MG/ML IM SOLN
10.0000 mg | Freq: Once | INTRAMUSCULAR | Status: AC
  Administered 2023-05-09: 10 mg via INTRAMUSCULAR
  Filled 2023-05-09: qty 2

## 2023-05-09 MED ORDER — SODIUM CHLORIDE 0.9 % IV SOLN
12.5000 mg | Freq: Once | INTRAVENOUS | Status: AC
  Administered 2023-05-09: 12.5 mg via INTRAVENOUS
  Filled 2023-05-09: qty 0.5

## 2023-05-09 MED ORDER — SODIUM CHLORIDE 0.9 % IV BOLUS
1000.0000 mL | Freq: Once | INTRAVENOUS | Status: AC
  Administered 2023-05-09: 1000 mL via INTRAVENOUS

## 2023-05-09 MED ORDER — DICYCLOMINE HCL 20 MG PO TABS: 20.0000 mg | ORAL_TABLET | Freq: Two times a day (BID) | ORAL | 0 refills | Status: DC

## 2023-05-09 MED ORDER — IOHEXOL 300 MG/ML  SOLN
100.0000 mL | Freq: Once | INTRAMUSCULAR | Status: AC | PRN
  Administered 2023-05-09: 100 mL via INTRAVENOUS

## 2023-05-09 NOTE — ED Provider Notes (Signed)
[place] EMERGENCY DEPARTMENT AT [facility] Provider Note   CSN: [id number] Arrival date & time: 05/09/23  1920     History  Chief Complaint  Patient presents with   Abdominal Pain    Brance Heidelberg Zatarain is a 67 y.o. male.  The history is provided by the patient, the spouse and medical records. No language interpreter was used.  Abdominal Pain     67 year old male with significant history of hypertension, hyperlipidemia, GERD, CAD, previous inguinal hernia repair, recent left knee wound infection with recent antibiotic use and previously had a left knee surgery, history of IBS presenting with complaint of abdominal pain.  Patient report for more than a month he has had recurrent abdominal discomfort.  States he is having bouts of abdominal cramping with bouts of nausea occasional vomiting and during between constipation and loose stools.  This seems to happen shortly after he had his left knee replacement and did develop some localized dermatitis for which he was placed on antibiotic.  He also had a right knee replacement 2 weeks prior.  Patient mention he was seen in the ED on 8/19 for his complaint.  At that time he had blood work done as well as CT scan of his abdomen and his C. difficile test of which came back negative.  His symptoms waxing waning but now it has become more intense.  He endorsed persistent nausea, indigestion, belching, feeling very gassy.  He is able to pass flatus.  He does not endorse fever but does endorse chills.  Denies any chest pain shortness of breath or dysuria.  He does feel dehydrated.  He has been taking probiotic at home.  He tries taking Zofran which only provided temporary relief of his nausea.  Home Medications Prior to Admission medications   Medication Sig Start Date End Date Taking? Authorizing Provider  aspirin 81 MG EC tablet Take 81 mg by mouth at bedtime. Swallow whole.   Yes [provider]  busPIRone (BUSPAR) 5 MG tablet  Take 5 mg by mouth 2 (two) times daily. 11/27/22  Yes [provider]  CELEBREX 200 MG capsule Take 200 mg by mouth 2 (two) times daily. 04/08/23  Yes [provider]  clonazePAM (KLONOPIN) 0.5 MG tablet Take 0.5 mg by mouth 2 (two) times daily as needed. 03/27/22  Yes [provider]  Evolocumab (REPATHA SURECLICK) 140 MG/ML SOAJ INJECT THE CONTENTS OF 1 SYRINGE INTO THE SKIN ONCE EVERY 14 DAYS 08/22/22  Yes Meriam Sprague, MD  famotidine (PEPCID) 20 MG tablet Take 20 mg by mouth 2 (two) times daily.   Yes [provider]  losartan (COZAAR) 25 MG tablet TAKE 1 TABLET BY MOUTH DAILY 02/19/22  Yes Parke Poisson, MD  metoprolol tartrate (LOPRESSOR) 25 MG tablet Take 0.5 tablets (12.5 mg total) by mouth 2 (two) times daily. 02/17/23  Yes Parke Poisson, MD  ondansetron (ZOFRAN) 4 MG tablet Take 4 mg by mouth every 6 (six) hours as needed. 04/08/23  Yes [provider]  rosuvastatin (CRESTOR) 5 MG tablet Take 5 mg by mouth at bedtime.  02/12/19  Yes [provider]  Magnesium 200 MG TABS Take 1 tablet by mouth in the morning and at bedtime.    [provider]  Multiple Vitamin (MULTIVITAMIN WITH MINERALS) TABS tablet Take 1 tablet by mouth daily.    [provider]  NON FORMULARY CPAP qhs    [provider]  promethazine (PHENERGAN) 25 MG tablet Take  1 tablet (25 mg total) by mouth every 8 (eight) hours as needed for nausea or vomiting. 04/21/23   Tegeler, Canary Brim, MD  Pyridoxine HCl (VITAMIN B6 PO) Take 1 tablet by mouth daily.    [provider]  VITAMIN K PO Take by mouth.    [provider]      Allergies    Augmentin [amoxicillin-pot clavulanate], Bacitracin, Balsam, Benzyl cinnamate, Betadine [povidone iodine], Clavulanic acid, Doxycycline, Gold sodium thiosulfate, Lanolin, Limonene, Linalool, Methylisothiazolinone, Other, Tea tree oil, and Neosporin [neomycin-bacitracin zn-polymyx]     Review of Systems   Review of Systems  Gastrointestinal:  Positive for abdominal pain.  All other systems reviewed and are negative.   Physical Exam Updated Vital Signs BP 105/72   Pulse 90   Temp 97.7 F (36.5 C)   Resp 20   SpO2 97%  Physical Exam Vitals and nursing note reviewed.  Constitutional:      General: He is not in acute distress.    Appearance: He is well-developed.     Comments: Patient appears uncomfortable but nontoxic.  HENT:     Head: Atraumatic.  Eyes:     Conjunctiva/sclera: Conjunctivae normal.  Cardiovascular:     Rate and Rhythm: Normal rate and regular rhythm.     Pulses: Normal pulses.     Heart sounds: Normal heart sounds.  Pulmonary:     Effort: Pulmonary effort is normal.     Breath sounds: Normal breath sounds.  Abdominal:     Tenderness: There is abdominal tenderness (Diffuse tenderness with guarding but no rebound tenderness).  Musculoskeletal:     Cervical back: Neck supple.  Skin:    Findings: No rash.  Neurological:     Mental Status: He is alert.     ED Results / Procedures / Treatments   Labs (all labs ordered are listed, but only abnormal results are displayed) Labs Reviewed  COMPREHENSIVE METABOLIC PANEL - Abnormal; Notable for the following components:      Result Value   Glucose, Bld 116 (*)    All other components within normal limits  CBC - Abnormal; Notable for the following components:   WBC 11.8 (*)    All other components within normal limits  URINALYSIS, ROUTINE W REFLEX MICROSCOPIC - Abnormal; Notable for the following components:   Color, Urine COLORLESS (*)    Ketones, ur TRACE (*)    All other components within normal limits  LIPASE, BLOOD    EKG None  Radiology No results found.  Procedures Procedures    Medications Ordered in ED Medications  promethazine (PHENERGAN) 12.5 mg in sodium chloride 0.9 % 50 mL IVPB (has no administration in time range)  promethazine (PHENERGAN) 25 MG/ML  injection (has no administration in time range)  iohexol (OMNIPAQUE) 300 MG/ML solution 100 mL (has no administration in time range)  sodium chloride 0.9 % bolus 1,000 mL (1,000 mLs Intravenous New Bag/Given 05/09/23 2309)  dicyclomine (BENTYL) injection 10 mg (10 mg Intramuscular Given 05/09/23 2327)    ED Course/ Medical Decision Making/ A&P                                 Medical Decision Making Amount and/or Complexity of Data Reviewed Labs: ordered. Radiology: ordered.  Risk Prescription drug management.   BP 105/72   Pulse 90   Temp 97.7 F (36.5 C)   Resp 20   SpO2 97%   10:19  PM  This is a 67 year old male with significant history of hypertension, hyperlipidemia, GERD, CAD, previous inguinal hernia repair, recent left knee wound infection with recent antibiotic use and previously had a left knee surgery, history of IBS presenting with complaint of abdominal pain.  Patient report for more than a month he has had recurrent abdominal discomfort.  States he is having bouts of abdominal cramping with bouts of nausea occasional vomiting and during between constipation and loose stools.  This seems to happen shortly after he had his left knee replacement and did develop some localized dermatitis for which he was placed on antibiotic.  He also had a right knee replacement 2 weeks prior.  Patient mention he was seen in the ED on 8/19 for his complaint.  At that time he had blood work done as well as CT scan of his abdomen and his C. difficile test of which came back negative.  His symptoms waxing waning but now it has become more intense.  He endorsed persistent nausea, indigestion, belching, feeling very gassy.  He is able to pass flatus.  He does not endorse fever but does endorse chills.  Denies any chest pain shortness of breath or dysuria.  He does feel dehydrated.  He has been taking probiotic at home.  He tries taking Zofran which only provided temporary relief of his nausea.  On exam  patient appears uncomfortable but nontoxic.  Heart with normal rate and rhythm, lungs are clear to auscultation bilaterally abdomen is diffusely tender with some guarding but no rebound tenderness.  Vital sign overall reassuring no fever no hypoxia.  -Labs ordered, independently viewed and interpreted by me.  Labs remarkable for WBC 11.8 -The patient was maintained on a cardiac monitor.  I personally viewed and interpreted the cardiac monitored which showed an underlying rhythm of: NSR -Imaging of abd/pelvis CT is currently pending -This patient presents to the ED for concern of abd pain, this involves an extensive number of treatment options, and is a complaint that carries with it a high risk of complications and morbidity.  The differential diagnosis includes colitis, appendicitis, c.diff, UTI, diverticulitis, opiate withdrawal.  -Co morbidities that complicate the patient evaluation includes SBO, alcohol abuse, GERD, IBS -Treatment includes bentyl, NS -Reevaluation of the patient after these medicines showed that the patient improved -PCP office notes or outside notes reviewed -Discussion with attending DR. Bernette Mayers -Escalation to admission/observation considered: dispo pending  Wife made me aware that patient has been on opiate pain medication for the past several weeks due to his knee surgeries and knee complication.  He was on hydromorphone and was recently weaned to tramadol 5 days ago for which he did not tolerate well.  Suspect his symptoms could be opiate withdrawal.  If CT scan is negative, consider supportive care.          Final Clinical Impression(s) / ED Diagnoses Final diagnoses:  None    Rx / DC Orders ED Discharge Orders     None         Fayrene Helper, PA-C 05/09/23 2345    Virgina Norfolk, DO 05/10/23 0107

## 2023-05-09 NOTE — ED Triage Notes (Signed)
Abdo pain x "months" Comes and goes,  No appetite, nausea and diarrhea gas bloating Recent surgery was on abt  Concerned about drug interaction due to new medications

## 2023-05-10 MED ORDER — PROMETHAZINE HCL 25 MG PO TABS: 25.0000 mg | ORAL_TABLET | Freq: Three times a day (TID) | ORAL | 0 refills | Status: DC | PRN

## 2023-05-10 MED ORDER — DICYCLOMINE HCL 20 MG PO TABS: 20.0000 mg | ORAL_TABLET | Freq: Two times a day (BID) | ORAL | 0 refills | Status: DC

## 2023-05-10 NOTE — ED Provider Notes (Signed)
Care of the patient assumed at shift change. Here for abdominal pain. Has had several recent orthopedic procedures and was on oxycodone and then hydromorphone post-op. He had similar pain when he finished his oxycodone prescription earlier in the summer. His ED workup is unremarkable including labs and CT. He is feeling much better, although some dizziness, after phenergan and bentyl but he is feeling well enough to go home. Advised this pain may be due to recent opioid prescription, but no way to know for sure. He does not have any apparent emergent or surgical cause of his pain to indicate admission. Plan discharge with Rx for Bentyl, Zofran and PCP follow up, RTED for any other concerns.     Pollyann Savoy, MD 05/10/23 0111

## 2023-05-30 ENCOUNTER — Other Ambulatory Visit: Payer: Self-pay | Admitting: Pharmacist

## 2023-05-30 DIAGNOSIS — E785 Hyperlipidemia, unspecified: Secondary | ICD-10-CM

## 2023-05-30 MED ORDER — REPATHA SURECLICK 140 MG/ML ~~LOC~~ SOAJ: SUBCUTANEOUS | 3 refills | Status: DC

## 2024-01-23 ENCOUNTER — Ambulatory Visit: Payer: Medicare Other | Attending: Internal Medicine | Admitting: Internal Medicine

## 2024-01-23 ENCOUNTER — Encounter: Payer: Self-pay | Admitting: Internal Medicine

## 2024-01-23 VITALS — BP 132/74 | HR 68 | Ht 63.0 in | Wt 171.2 lb

## 2024-01-23 DIAGNOSIS — K219 Gastro-esophageal reflux disease without esophagitis: Secondary | ICD-10-CM | POA: Insufficient documentation

## 2024-01-23 DIAGNOSIS — I10 Essential (primary) hypertension: Secondary | ICD-10-CM | POA: Insufficient documentation

## 2024-01-23 DIAGNOSIS — R9431 Abnormal electrocardiogram [ECG] [EKG]: Secondary | ICD-10-CM | POA: Insufficient documentation

## 2024-01-23 DIAGNOSIS — E785 Hyperlipidemia, unspecified: Secondary | ICD-10-CM | POA: Diagnosis present

## 2024-01-23 DIAGNOSIS — I2510 Atherosclerotic heart disease of native coronary artery without angina pectoris: Secondary | ICD-10-CM | POA: Insufficient documentation

## 2024-01-23 DIAGNOSIS — R002 Palpitations: Secondary | ICD-10-CM | POA: Diagnosis present

## 2024-01-23 NOTE — Patient Instructions (Signed)
 Medication Instructions:  No Changes *If you need a refill on your cardiac medications before your next appointment, please call your pharmacy*  Lab Work: None  Testing/Procedures: Your physician has requested that you have an echocardiogram, next available appointment. Echocardiography is a painless test that uses sound waves to create images of your heart. It provides your doctor with information about the size and shape of your heart and how well your heart's chambers and valves are working. This procedure takes approximately one hour. There are no restrictions for this procedure. Please do NOT wear cologne, perfume, aftershave, or lotions (deodorant is allowed). Please arrive 15 minutes prior to your appointment time. This will take place at [address] [place]   Follow-Up: At [facility], you and your health needs are our priority.  As part of our continuing mission to provide you with exceptional heart care, our providers are all part of one team.  This team includes your primary Cardiologist (physician) and Advanced Practice Providers or APPs (Physician Assistants and Nurse Practitioners) who all work together to provide you with the care you need, when you need it.  Your next appointment:   1 year(s)  Provider:   Gayatri A Acharya, MD     Other Instructions Please call us  or send a MyChart message with any Cardiology related questions/concerns.  [phone].  Thank you!

## 2024-01-23 NOTE — Progress Notes (Signed)
 Cardiology Office Note:  .   Date:  01/23/2024  ID:  Eric Rhodes, DOB [birth year], MRN [id number] PCP: Perley Bradley, MD  [facility] Providers Cardiologist:  Euell Herrlich, MD    History of Present Illness: .   Eric Rhodes is a 68 y.o. male.  Discussed the use of AI scribe software for clinical note transcription with the patient, who gave verbal consent to proceed.  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  He has no current palpitations, currently on metoprolol  12.5 mg twice daily. EKG however shows PVCs. He denies chest discomfort or dyspnea during exercise. He maintains an exercise routine, cycling 40 to 50 miles three times a week and attending gym workouts.  He has a family history of sudden cardiac death and a history of hypertension and hyperlipidemia. His current medications include losartan  25 mg daily for blood pressure, which is well-controlled, and a baby aspirin  daily. He also uses a CPAP machine for sleep apnea, which has helped reduce palpitations.  He recently started Zepbound 2.5 mg weekly for weight management after experiencing weight gain possibly related to Lexapro used for situational mood disorder, which he is considering discontinuing. He reports feeling much better.    ROS: negative except per HPI above.  Studies Reviewed: Eric Rhodes   EKG Interpretation Date/Time:  Friday Jan 23 2024 08:06:21 EDT Ventricular Rate:  68 PR Interval:  164 QRS Duration:  86 QT Interval:  414 QTC Calculation: 440 R Axis:   80  Text Interpretation: Sinus rhythm with frequent and consecutive Premature ventricular complexes Septal infarct , age undetermined When compared with ECG of 21-Apr-2022 00:31, Septal infarct is now Present Confirmed by Eric Rhodes ([id number]) on 01/23/2024 8:21:36 AM     Results LABS Lipid panel: LDL 23 mg/dL, Triglycerides 45 mg/dL (65/7846) NGE9B: 2.8% (02/2023) Kidney function: Stable (02/2023) Hemoglobin: Stable (02/2023) Liver function: Stable (02/2023) Platelets: Stable (02/2023)  RADIOLOGY CT scan: Ascending aorta normal  DIAGNOSTIC EKG: Septal infarct pattern with PVCs (01/23/2024) Echocardiogram: Essentially normal, ascending aorta borderline dilated (06/2017) Stress test: Normal (06/2017) Coronary artery calcium  score: 285 (2019) Risk Assessment/Calculations:       Physical Exam:   VS:  BP 132/74 (BP Location: Left Arm, Patient Position: Sitting, Cuff Size: Large)   Pulse 68   Ht 5\' 3"  (1.6 m)   Wt 171 lb 3.2 oz (77.7 kg)   SpO2 97%   BMI 30.33 kg/m    Wt Readings from Last 3 Encounters:  01/23/24 171 lb 3.2 oz (77.7 kg)  04/21/23 158 lb (71.7 kg)  02/17/23 161 lb 9.6 oz (73.3 kg)     Physical Exam VITALS: BP- 132/74 GENERAL: Alert, cooperative, well developed, no acute distress. HEENT: Normocephalic, normal oropharynx, moist mucous membranes. CHEST: Clear to auscultation bilaterally, no wheezes, rhonchi, or crackles. CARDIOVASCULAR: Normal heart rate and rhythm, S1 and S2 normal without murmurs. ABDOMEN: Soft, non-tender, non-distended, without organomegaly, normal bowel sounds. EXTREMITIES: No cyanosis or edema. NEUROLOGICAL: Cranial nerves grossly intact, moves all extremities without gross motor or sensory deficit.   ASSESSMENT AND PLAN: .    Assessment and Plan Assessment & Plan Coronary artery disease Abnormal EKG Moderate nonobstructive disease with calcium  score 285, moderate LAD and diagonal blockage. EKG shows septal infarct pattern with PVCs, grossly unchanged from prior but now denoting septal infarct with no R wave noted. Discussed abnormal EKG  PVC burden impact on cardiac function. - Order echocardiogram to assess cardiac function changes. - Consider heart monitor if echocardiogram shows significant  PVC burden.  Palpitations Controlled with metoprolol . CPAP reduced palpitations. Discussed caffeine's role in exacerbation. - Continue metoprolol  12.5 mg twice daily. - Monitor for palpitations, especially post-caffeine intake.  Hypertension Well-controlled with losartan . Recent reading 132/74, possibly due to caffeine.  Hyperlipidemia Managed with Repatha  and rosuvastatin . LDL 23, triglycerides 45, stable labs.  Gastroesophageal reflux disease (GERD) Well-controlled.  Peptic ulcer disease No current symptoms.  Irritable bowel syndrome Well-controlled with dietary awareness.  Follow-up Plans to monitor cardiac health and medication effects. - Schedule follow-up appointment in one year. - Review echocardiogram results once available.      Eric Lawman, MD, FACC

## 2024-01-29 ENCOUNTER — Other Ambulatory Visit: Payer: Self-pay | Admitting: Internal Medicine

## 2024-01-29 DIAGNOSIS — E785 Hyperlipidemia, unspecified: Secondary | ICD-10-CM

## 2024-01-29 DIAGNOSIS — R002 Palpitations: Secondary | ICD-10-CM

## 2024-02-02 ENCOUNTER — Other Ambulatory Visit: Payer: Self-pay

## 2024-02-02 DIAGNOSIS — R002 Palpitations: Secondary | ICD-10-CM

## 2024-02-02 DIAGNOSIS — E785 Hyperlipidemia, unspecified: Secondary | ICD-10-CM

## 2024-02-02 MED ORDER — LOSARTAN POTASSIUM 25 MG PO TABS: 25.0000 mg | ORAL_TABLET | Freq: Every day | ORAL | 3 refills | Status: AC

## 2024-03-02 ENCOUNTER — Ambulatory Visit (HOSPITAL_COMMUNITY)
Admission: RE | Admit: 2024-03-02 | Discharge: 2024-03-02 | Disposition: A | Discharge status: Home / Self Care | Source: Ambulatory Visit | Attending: Cardiology | Admitting: Cardiology

## 2024-03-02 DIAGNOSIS — R9431 Abnormal electrocardiogram [ECG] [EKG]: Secondary | ICD-10-CM | POA: Diagnosis present

## 2024-03-02 LAB — ECHOCARDIOGRAM COMPLETE
Area-P 1/2: 3.17 cm2
MV M vel: 5.17 m/s
MV Peak grad: 106.9 mmHg
Radius: 0.6 cm
S' Lateral: 3.1 cm

## 2024-03-03 ENCOUNTER — Encounter: Payer: Self-pay | Admitting: Internal Medicine

## 2024-03-08 NOTE — Telephone Encounter (Signed)
 Called and spoke to pt. Gave him Dr. Laurence general results regarding recent Echocardiogram. He is not available Tues/Thursdays early morning as these are his cycling days. He is available any time Mon/Wed/Fri or any Afternoon. Will ask Dr. Acharya about other potential days/times to schedule f/u appointment to discuss Echo in detail.

## 2024-03-15 ENCOUNTER — Ambulatory Visit: Payer: Self-pay | Admitting: Internal Medicine

## 2024-03-30 ENCOUNTER — Other Ambulatory Visit: Payer: Self-pay | Admitting: Internal Medicine

## 2024-04-06 NOTE — Telephone Encounter (Signed)
*  STAT* If patient is at the pharmacy, call can be transferred to refill team.   1. Which medications need to be refilled? (please list name of each medication and dose if known) Metoprolol     2. Would you like to learn more about the convenience, safety, & potential cost savings by using the [facility]?  Na e   3. Are you open to using the [facility] (Type [facility].  ).   4. Which pharmacy/location (including street and city if local pharmacy) is medication to be sent to? [id number] - [place], [place] - [address]    5. Do they need a 30 day or 90 day supply? 90

## 2024-04-09 ENCOUNTER — Encounter: Payer: Self-pay | Admitting: Internal Medicine

## 2024-04-09 ENCOUNTER — Ambulatory Visit

## 2024-04-09 ENCOUNTER — Ambulatory Visit: Attending: Internal Medicine | Admitting: Internal Medicine

## 2024-04-09 VITALS — BP 110/62 | HR 63 | Ht 63.5 in | Wt 158.0 lb

## 2024-04-09 DIAGNOSIS — I340 Nonrheumatic mitral (valve) insufficiency: Secondary | ICD-10-CM | POA: Diagnosis present

## 2024-04-09 DIAGNOSIS — I493 Ventricular premature depolarization: Secondary | ICD-10-CM | POA: Insufficient documentation

## 2024-04-09 DIAGNOSIS — R002 Palpitations: Secondary | ICD-10-CM | POA: Diagnosis present

## 2024-04-09 DIAGNOSIS — R9431 Abnormal electrocardiogram [ECG] [EKG]: Secondary | ICD-10-CM | POA: Diagnosis present

## 2024-04-09 LAB — CBC

## 2024-04-09 NOTE — Progress Notes (Unsigned)
 Enrolled for [employer] to mail a ZIO XT long term holter monitor to the patients address on file.

## 2024-04-09 NOTE — H&P (View-Only) (Signed)
 Cardiology Office Note:  .   Date:  04/09/2024  ID:  Eric Rhodes, DOB [birth year], MRN [id number] PCP: Eric Planas, MD  [facility] Providers Cardiologist:  Eric LABOR Merck, MD    History of Present Illness: .   Eric Rhodes is a 68 y.o. male.  Discussed the use of AI scribe software for clinical note transcription with the patient, who gave verbal consent to proceed.  History of Present Illness Eric Rhodes is a 68 year old male with mitral valve regurgitation who presents with concerns about his heart health and symptoms of palpitations and shortness of breath. He is accompanied by his wife, Eric Rhodes.  He experiences palpitations and shortness of breath, which have intensified following recent diagnostic tests. An EKG from 2025 indicated a septal infarct and PVCs, while a 2023 EKG showed sinus bradycardia with PVCs and sinus rhythm. Despite eliminating caffeine, palpitations persist. He maintains good hydration during physical activities, consuming 70 to 80 ounces of fluids per bike ride. Recent tests showed normal thyroid  function and electrolytes, and he is not anemic. He takes metoprolol , half a tablet twice daily, with a resting heart rate of 55 bpm.  A recent echocardiogram was performed. A previous echo from 2018 showed trace mitral valve regurgitation. He is concerned about the progression of his mitral regurgitation and its impact on his physical activities. He is very physically active, engaging in cycling and weightlifting, and has noticed decreased exercise tolerance, with shortness of breath occurring at a lower heart rate than before. He also experiences swelling in his feet and occasional chest pressure, described as a squeezing sensation.    ROS: negative except per HPI above.  Studies Reviewed: .        Results LABS - Thyroid  function: Normal (September 2024) - Electrolytes: Normal - Cholesterol panel: Normal (July 2025) - Hemoglobin:  Normal (July 2025) - Creatinine: 1.1 mg/dL (July 7974)  RADIOLOGY - Coronary CT: Normal coronary arteries  DIAGNOSTIC - EKG: Septal infarct, premature ventricular contractions (PVCs) - EKG: Sinus bradycardia, normal sinus rhythm (2023) - EKG: Normal (2024) - Echocardiogram: Mitral regurgitation, no prolapse, no septal infarct - Echocardiogram: Trace mitral valve regurgitation (2018) Risk Assessment/Calculations:       Physical Exam:   VS:  BP 110/62   Pulse 63   Ht 5' 3.5 (1.613 m)   Wt 158 lb (71.7 kg)   SpO2 96%   BMI 27.55 kg/m    Wt Readings from Last 3 Encounters:  04/09/24 158 lb (71.7 kg)  01/23/24 171 lb 3.2 oz (77.7 kg)  04/21/23 158 lb (71.7 kg)     Physical Exam GENERAL: Alert, cooperative, well developed, no acute distress HEENT: Normocephalic, normal oropharynx, moist mucous membranes CHEST: Clear to auscultation bilaterally, no wheezes, rhonchi, or crackles CARDIOVASCULAR: Normal heart rate and rhythm, S1 and S2 normal without murmurs ABDOMEN: Soft, non-tender, non-distended, without organomegaly, normal bowel sounds EXTREMITIES: No cyanosis or edema NEUROLOGICAL: Cranial nerves grossly intact, moves all extremities without gross motor or sensory deficit   ASSESSMENT AND PLAN: .    Assessment and Plan Assessment & Plan Mitral valve regurgitation Moderate regurgitation with central jets, no prolapse or torn chordae visible by TTE. Symptoms include exertional dyspnea and chest pressure. Dynamic regurgitation suspected. Further evaluation required for mechanism and severity. Future repair considered to maintain activity. - Order TEE on April 26, 2024, to assess mitral valve structure and function. - Schedule bicycle stress echocardiogram to evaluate regurgitation during exercise and pulmonary pressures. -  Discuss findings at valve meeting post-TEE for management consensus.  Premature ventricular contractions (PVCs) and palpitations Frequent PVCs with  palpitations, not reduced by caffeine elimination. Normal thyroid , electrolytes, and hemoglobin. No cardiomyopathy or significant muscle damage. PVCs may contribute to symptoms, unrelated to mitral regurgitation. On metoprolol ; further reduction may need additional interventions. - Order three-day Zio patch for PVC monitoring to assess daily burden. - Consider electrophysiology referral if PVC burden is significant.  I spent 40 minutes in the care of Eric Rhodes today including reviewing labs (TSH, CBC), reviewing studies (Echo 03/02/24 ), face to face time discussing treatment options (36 min), and documenting in the encounter.    Eric Merck, MD, FACC

## 2024-04-09 NOTE — Patient Instructions (Addendum)
 Medication Instructions:  No Changes *If you need a refill on your cardiac medications before your next appointment, please call your pharmacy*  Lab Work: CBC and BMET, non fasting, for preparation of your TEE   Testing/Procedures: Your physician has requested that you have a Bicycle stress echocardiogram on 04/29/2024 at 11:15 am. For further information please visit [email or web address] Please follow instruction sheet as given.  Please note: We ask at that you not bring children with you during ultrasound (echo/ vascular) testing. Due to room size and safety concerns, children are not allowed in the ultrasound rooms during exams. Our front office staff cannot provide observation of children in our lobby area while testing is being conducted. An adult accompanying a patient to their appointment will only be allowed in the ultrasound room at the discretion of the ultrasound technician under special circumstances. We apologize for any inconvenience.   ZIO XT- Long Term Monitor Instructions  Your physician has requested you wear a ZIO patch monitor for 3 days.  This is a single patch monitor. [employer] supplies one patch monitor per enrollment. Additional stickers are not available. Please do not apply patch if you will be having a Nuclear Stress Test,  Echocardiogram, Cardiac CT, MRI, or Chest Xray during the period you would be wearing the  monitor. The patch cannot be worn during these tests. You cannot remove and re-apply the  ZIO XT patch monitor.  Your ZIO patch monitor will be mailed 3 day USPS to your address on file. It may take 3-5 days  to receive your monitor after you have been enrolled.  Once you have received your monitor, please review the enclosed instructions. Your monitor  has already been registered assigning a specific monitor serial # to you.  Billing and Patient Assistance Program Information  We have supplied [employer] with any of your insurance information on file for  billing purposes. [employer] offers a sliding scale Patient Assistance Program for patients that do not have  insurance, or whose insurance does not completely cover the cost of the ZIO monitor.  You must apply for the Patient Assistance Program to qualify for this discounted rate.  To apply, please call [employer] at [phone], select option 4, select option 2, ask to apply for  Patient Assistance Program. [name] will ask your household income, and how many people  are in your household. They will quote your out-of-pocket cost based on that information.  [employer] will also be able to set up a 67-month, interest-free payment plan if needed.  Applying the monitor   Shave hair from upper left chest.  Hold abrader disc by orange tab. Rub abrader in 40 strokes over the upper left chest as  indicated in your monitor instructions.  Clean area with 4 enclosed alcohol pads. Let dry.  Apply patch as indicated in monitor instructions. Patch will be placed under collarbone on left  side of chest with arrow pointing upward.  Rub patch adhesive wings for 2 minutes. Remove white label marked 1. Remove the white  label marked 2. Rub patch adhesive wings for 2 additional minutes.  While looking in a mirror, press and release button in center of patch. A small green light will  flash 3-4 times. This will be your only indicator that the monitor has been turned on.  Do not shower for the first 24 hours. You may shower after the first 24 hours.  Press the button if you feel a symptom. You will hear a small click. Record Date, Time  and  Symptom in the Patient Logbook.  When you are ready to remove the patch, follow instructions on the last 2 pages of Patient  Logbook. Stick patch monitor onto the last page of Patient Logbook.  Place Patient Logbook in the blue and white box. Use locking tab on box and tape box closed  securely. The blue and white box has prepaid postage on it. Please place it in the mailbox  as  soon as possible. Your physician should have your test results approximately 7 days after the  monitor has been mailed back to [facility].  Call [facility] Customer Care at [phone] if you have questions regarding  your ZIO XT patch monitor. Call them immediately if you see an orange light blinking on your  monitor.  If your monitor falls off in less than 4 days, contact our Monitor department at [phone].  If your monitor becomes loose or falls off after 4 days call [employer] at [phone] for  suggestions on securing your monitor       Dear Eric Rhodes  You are scheduled for a TEE (Transesophageal Echocardiogram) on Monday, August 25 with Dr. Soyla Merck.  Please arrive at the [facility] (Main Entrance A) at [facility]: [address] [place], [place] [address] at 10:30 AM (This time is 1 hour(s) before your procedure to ensure your preparation).   Free valet parking service is available. You will check in at ADMITTING.   *Please Note: You will receive a call the day before your procedure to confirm the appointment time. That time may have changed from the original time based on the schedule for that day.*    DIET:  Nothing to eat or drink after midnight except a sip of water with medications (see medication instructions below)  MEDICATION INSTRUCTIONS: !!IF ANY NEW MEDICATIONS ARE STARTED AFTER TODAY, PLEASE NOTIFY YOUR PROVIDER AS SOON AS POSSIBLE!!  FYI: Medications such as Semaglutide (Ozempic, [place]), Tirzepatide (Mounjaro, Zepbound), Dulaglutide (Trulicity), etc (GLP1 agonists) AND Canagliflozin (Invokana), Dapagliflozin (Farxiga), Empagliflozin (Jardiance), Ertugliflozin (Steglatro), Bexagliflozin [employer]) or any combination with one of these drugs such as Invokamet (Canagliflozin/Metformin), Synjardy (Empagliflozin/Metformin), etc (SGLT2 inhibitors) must be held around the time of a procedure. This is not a comprehensive list of  all of these drugs. Please review all of your medications and talk to your provider if you take any one of these. If you are not sure, ask your provider.    HOLD: Tirzepatide (Mounjaro, Zepbound) for 1 week prior to the procedure. Last dose on Monday, August 11.2025     LABS: Done at office visit on 04/09/24 (CBC and BMET)  FYI:  For your safety, and to allow us  to monitor your vital signs accurately during the surgery/procedure we request: If you have artificial nails, gel coating, SNS etc, please have those removed prior to your surgery/procedure. Not having the nail coverings /polish removed may result in cancellation or delay of your surgery/procedure.  Your support person will be asked to wait in the waiting room during your procedure.  It is OK to have someone drop you off and come back when you are ready to be discharged.  You cannot drive after the procedure and will need someone to drive you home.  Bring your insurance cards.  *Special Note: Every effort is made to have your procedure done on time. Occasionally there are emergencies that occur at the hospital that may cause delays. Please be patient if a delay does occur.  Follow-Up: At [facility], you and your health needs are our priority.  As part of our continuing mission to provide you with exceptional heart care, our providers are all part of one team.  This team includes your primary Cardiologist (physician) and Advanced Practice Providers or APPs (Physician Assistants and Nurse Practitioners) who all work together to provide you with the care you need, when you need it.  Your next appointment:    To be determined after testing  Provider:   Gayatri A Acharya, MD   Other Instructions Please call us  or send a MyChart message with any Cardiology related questions/concerns.  [phone].  Thank you!

## 2024-04-09 NOTE — Progress Notes (Addendum)
 Cardiology Office Note:  .   Date:  04/09/2024  ID:  Reyes LABOR Yeoman, DOB 06-13-1956, MRN [id number] PCP: Cleotilde Planas, MD  [facility] Providers Cardiologist:  Soyla LABOR Merck, MD    History of Present Illness: .   Wyman Meschke Jaffer is a 68 y.o. male.  Discussed the use of AI scribe software for clinical note transcription with the patient, who gave verbal consent to proceed.  History of Present Illness ATLEY NEUBERT is a 68 year old male with mitral valve regurgitation who presents with concerns about his heart health and symptoms of palpitations and shortness of breath. He is accompanied by his wife, Leita.  He experiences palpitations and shortness of breath, which have intensified following recent diagnostic tests. An EKG from 2025 indicated a septal infarct and PVCs, while a 2023 EKG showed sinus bradycardia with PVCs and sinus rhythm. Despite eliminating caffeine, palpitations persist. He maintains good hydration during physical activities, consuming 70 to 80 ounces of fluids per bike ride. Recent tests showed normal thyroid  function and electrolytes, and he is not anemic. He takes metoprolol , half a tablet twice daily, with a resting heart rate of 55 bpm.  A recent echocardiogram was performed. A previous echo from 2018 showed trace mitral valve regurgitation. He is concerned about the progression of his mitral regurgitation and its impact on his physical activities. He is very physically active, engaging in cycling and weightlifting, and has noticed decreased exercise tolerance, with shortness of breath occurring at a lower heart rate than before. He also experiences swelling in his feet and occasional chest pressure, described as a squeezing sensation.    ROS: negative except per HPI above.  Studies Reviewed: .        Results LABS - Thyroid  function: Normal (September 2024) - Electrolytes: Normal - Cholesterol panel: Normal (July 2025) - Hemoglobin:  Normal (July 2025) - Creatinine: 1.1 mg/dL (July 7974)  RADIOLOGY - Coronary CT: Normal coronary arteries  DIAGNOSTIC - EKG: Septal infarct, premature ventricular contractions (PVCs) - EKG: Sinus bradycardia, normal sinus rhythm (2023) - EKG: Normal (2024) - Echocardiogram: Mitral regurgitation, no prolapse, no septal infarct - Echocardiogram: Trace mitral valve regurgitation (2018) Risk Assessment/Calculations:       Physical Exam:   VS:  BP 110/62   Pulse 63   Ht 5' 3.5 (1.613 m)   Wt 158 lb (71.7 kg)   SpO2 96%   BMI 27.55 kg/m    Wt Readings from Last 3 Encounters:  04/09/24 158 lb (71.7 kg)  01/23/24 171 lb 3.2 oz (77.7 kg)  04/21/23 158 lb (71.7 kg)     Physical Exam GENERAL: Alert, cooperative, well developed, no acute distress HEENT: Normocephalic, normal oropharynx, moist mucous membranes CHEST: Clear to auscultation bilaterally, no wheezes, rhonchi, or crackles CARDIOVASCULAR: Normal heart rate and rhythm, S1 and S2 normal without murmurs ABDOMEN: Soft, non-tender, non-distended, without organomegaly, normal bowel sounds EXTREMITIES: No cyanosis or edema NEUROLOGICAL: Cranial nerves grossly intact, moves all extremities without gross motor or sensory deficit   ASSESSMENT AND PLAN: .    Assessment and Plan Assessment & Plan Mitral valve regurgitation Moderate regurgitation with central jets, no prolapse or torn chordae visible by TTE. Symptoms include exertional dyspnea and chest pressure. Dynamic regurgitation suspected. Further evaluation required for mechanism and severity. Future repair considered to maintain activity. - Order TEE on April 26, 2024, to assess mitral valve structure and function. - Schedule bicycle stress echocardiogram to evaluate regurgitation during exercise and pulmonary pressures. -  Discuss findings at valve meeting post-TEE for management consensus.  Premature ventricular contractions (PVCs) and palpitations Frequent PVCs with  palpitations, not reduced by caffeine elimination. Normal thyroid , electrolytes, and hemoglobin. No cardiomyopathy or significant muscle damage. PVCs may contribute to symptoms, unrelated to mitral regurgitation. On metoprolol . - Order three-day Zio patch for PVC monitoring to assess daily burden. - Consider electrophysiology referral if PVC burden is significant.  I spent 40 minutes in the care of Oleg Oleson Guzek today including reviewing labs (TSH, CBC), reviewing studies (Echo 03/02/24 ), face to face time discussing treatment options (36 min), and documenting in the encounter.    Soyla Merck, MD, FACC

## 2024-04-10 ENCOUNTER — Ambulatory Visit: Payer: Self-pay | Admitting: Internal Medicine

## 2024-04-10 DIAGNOSIS — I493 Ventricular premature depolarization: Secondary | ICD-10-CM

## 2024-04-10 DIAGNOSIS — R9431 Abnormal electrocardiogram [ECG] [EKG]: Secondary | ICD-10-CM

## 2024-04-10 DIAGNOSIS — R002 Palpitations: Secondary | ICD-10-CM

## 2024-04-10 LAB — CBC
Hematocrit: 45.8 % (ref 37.5–51.0)
Hemoglobin: 15.1 g/dL (ref 13.0–17.7)
MCH: 30.8 pg (ref 26.6–33.0)
MCHC: 33 g/dL (ref 31.5–35.7)
MCV: 93 fL (ref 79–97)
Platelets: 185 x10E3/uL (ref 150–450)
RBC: 4.91 x10E6/uL (ref 4.14–5.80)
RDW: 12.5 % (ref 11.6–15.4)
WBC: 4.8 x10E3/uL (ref 3.4–10.8)

## 2024-04-10 LAB — BASIC METABOLIC PANEL WITH GFR
BUN/Creatinine Ratio: 18 (ref 10–24)
BUN: 21 mg/dL (ref 8–27)
CO2: 18 mmol/L — ABNORMAL LOW (ref 20–29)
Calcium: 9.2 mg/dL (ref 8.6–10.2)
Chloride: 100 mmol/L (ref 96–106)
Creatinine, Ser: 1.15 mg/dL (ref 0.76–1.27)
Glucose: 80 mg/dL (ref 70–99)
Potassium: 4.8 mmol/L (ref 3.5–5.2)
Sodium: 135 mmol/L (ref 134–144)
eGFR: 69 mL/min/1.73 (ref >59)

## 2024-04-12 ENCOUNTER — Encounter: Payer: Self-pay | Admitting: Internal Medicine

## 2024-04-15 ENCOUNTER — Encounter (HOSPITAL_COMMUNITY): Payer: Self-pay | Admitting: *Deleted

## 2024-04-20 ENCOUNTER — Other Ambulatory Visit: Payer: Self-pay | Admitting: *Deleted

## 2024-04-20 DIAGNOSIS — I340 Nonrheumatic mitral (valve) insufficiency: Secondary | ICD-10-CM

## 2024-04-21 ENCOUNTER — Encounter: Payer: Self-pay | Admitting: Internal Medicine

## 2024-04-23 NOTE — Progress Notes (Signed)
 Pt called for pre procedure instructions. Arrival time 1000 NPO after midnight explained Instructed to take am meds with sip of water. Instructed pt need for ride home tomorrow and have responsible adult with them for 24 hrs post procedure.

## 2024-04-24 DIAGNOSIS — I493 Ventricular premature depolarization: Secondary | ICD-10-CM | POA: Diagnosis not present

## 2024-04-25 NOTE — Anesthesia Preprocedure Evaluation (Signed)
 Anesthesia Evaluation  Patient identified by MRN, date of birth, ID band Patient awake    Reviewed: Allergy & Precautions, NPO status , Patient's Chart, lab work & pertinent test results, reviewed documented beta blocker date and time   History of Anesthesia Complications Negative for: history of anesthetic complications  Airway Mallampati: II  TM Distance: >3 FB Neck ROM: Full    Dental no notable dental hx. (+) Teeth Intact, Dental Advisory Given   Pulmonary sleep apnea and Continuous Positive Airway Pressure Ventilation    Pulmonary exam normal breath sounds clear to auscultation       Cardiovascular hypertension, Pt. on medications and Pt. on home beta blockers (-) angina + CAD  (-) Past MI Normal cardiovascular exam+ Valvular Problems/Murmurs (mod) MR  Rhythm:Regular Rate:Normal  03/02/2024 TTE  1. Left ventricular ejection fraction, by estimation, is 60 to 65%. Left  ventricular ejection fraction by 3D volume is 61 %. The left ventricle has  normal function. The left ventricle has no regional wall motion  abnormalities. Left ventricular diastolic   parameters were normal.   2. Right ventricular systolic function is normal. The right ventricular  size is normal. There is normal pulmonary artery systolic pressure. The  estimated right ventricular systolic pressure is 32.6 mmHg.   3. The mitral valve is normal in structure. There are two MR jets  possibly originating at A1/P1 and A3/P3; all together MR appears to be  moderate.   4. The aortic valve is normal in structure. Aortic valve regurgitation is  not visualized. No aortic stenosis is present.   5. The inferior vena cava is normal in size with greater than 50%  respiratory variability, suggesting right atrial pressure of 3 mmHg.   6. Left atrial size was mildly dilated.      Neuro/Psych   Anxiety     negative neurological ROS  negative psych ROS   GI/Hepatic ,GERD   Medicated and Controlled,,  Endo/Other  diabetes    Renal/GU Lab Results      Component                Value               Date                           K                        4.8                 04/09/2024                CO2                      18 (L)              04/09/2024                BUN                      21                  04/09/2024                CREATININE               1.15  04/09/2024                EGFR                     69                  04/09/2024                     Musculoskeletal   Abdominal   Peds  Hematology Lab Results      Component                Value               Date                      WBC                      4.8                 04/09/2024                HGB                      15.1                04/09/2024                HCT                      45.8                04/09/2024                MCV                      93                  04/09/2024                PLT                      185                 04/09/2024              Anesthesia Other Findings All: see list  Reproductive/Obstetrics                              Anesthesia Physical Anesthesia Plan  ASA: 3  Anesthesia Plan: MAC   Post-op Pain Management: Minimal or no pain anticipated   Induction:   PONV Risk Score and Plan: 1 and Propofol  infusion and Treatment may vary due to age or medical condition  Airway Management Planned: Natural Airway and Nasal Cannula  Additional Equipment: None  Intra-op Plan:   Post-operative Plan:   Informed Consent: I have reviewed the patients History and Physical, chart, labs and discussed the procedure including the risks, benefits and alternatives for the proposed anesthesia with the patient or authorized representative who has indicated his/her understanding and acceptance.     Dental advisory given  Plan Discussed with: CRNA and Anesthesiologist  Anesthesia Plan Comments: (TEE  for MR)         Anesthesia Quick Evaluation

## 2024-04-26 ENCOUNTER — Ambulatory Visit (HOSPITAL_BASED_OUTPATIENT_CLINIC_OR_DEPARTMENT_OTHER)

## 2024-04-26 ENCOUNTER — Telehealth: Payer: Self-pay | Admitting: *Deleted

## 2024-04-26 ENCOUNTER — Encounter (HOSPITAL_COMMUNITY): Payer: Self-pay | Admitting: Internal Medicine

## 2024-04-26 ENCOUNTER — Ambulatory Visit (HOSPITAL_COMMUNITY): Payer: Self-pay | Admitting: Anesthesiology

## 2024-04-26 ENCOUNTER — Other Ambulatory Visit: Payer: Self-pay

## 2024-04-26 ENCOUNTER — Ambulatory Visit (HOSPITAL_COMMUNITY)
Admission: RE | Admit: 2024-04-26 | Discharge: 2024-04-26 | Disposition: A | Discharge status: Home / Self Care | Attending: Internal Medicine | Admitting: Internal Medicine

## 2024-04-26 ENCOUNTER — Encounter (HOSPITAL_COMMUNITY)
Admission: RE | Disposition: A | Discharge status: Home / Self Care | Payer: Self-pay | Source: Home / Self Care | Attending: Internal Medicine

## 2024-04-26 DIAGNOSIS — I493 Ventricular premature depolarization: Secondary | ICD-10-CM

## 2024-04-26 DIAGNOSIS — E119 Type 2 diabetes mellitus without complications: Secondary | ICD-10-CM | POA: Diagnosis not present

## 2024-04-26 DIAGNOSIS — E785 Hyperlipidemia, unspecified: Secondary | ICD-10-CM

## 2024-04-26 DIAGNOSIS — I340 Nonrheumatic mitral (valve) insufficiency: Secondary | ICD-10-CM | POA: Diagnosis present

## 2024-04-26 DIAGNOSIS — K219 Gastro-esophageal reflux disease without esophagitis: Secondary | ICD-10-CM | POA: Insufficient documentation

## 2024-04-26 DIAGNOSIS — G4730 Sleep apnea, unspecified: Secondary | ICD-10-CM | POA: Insufficient documentation

## 2024-04-26 DIAGNOSIS — I10 Essential (primary) hypertension: Secondary | ICD-10-CM

## 2024-04-26 DIAGNOSIS — I2510 Atherosclerotic heart disease of native coronary artery without angina pectoris: Secondary | ICD-10-CM | POA: Diagnosis not present

## 2024-04-26 DIAGNOSIS — R002 Palpitations: Secondary | ICD-10-CM

## 2024-04-26 DIAGNOSIS — I371 Nonrheumatic pulmonary valve insufficiency: Secondary | ICD-10-CM | POA: Diagnosis not present

## 2024-04-26 DIAGNOSIS — Z79899 Other long term (current) drug therapy: Secondary | ICD-10-CM | POA: Diagnosis not present

## 2024-04-26 HISTORY — PX: TRANSESOPHAGEAL ECHOCARDIOGRAM (CATH LAB): EP1270

## 2024-04-26 LAB — ECHO TEE

## 2024-04-26 SURGERY — TRANSESOPHAGEAL ECHOCARDIOGRAM (TEE) (CATHLAB)
Anesthesia: Monitor Anesthesia Care

## 2024-04-26 MED ORDER — PHENYLEPHRINE HCL (PRESSORS) 10 MG/ML IV SOLN
INTRAVENOUS | Status: DC | PRN
Start: 2024-04-26 — End: 2024-04-26
  Administered 2024-04-26 (×2): 80 ug via INTRAVENOUS

## 2024-04-26 MED ORDER — LIDOCAINE 2% (20 MG/ML) 5 ML SYRINGE
INTRAMUSCULAR | Status: DC | PRN
  Administered 2024-04-26: 70 mg via INTRAVENOUS

## 2024-04-26 MED ORDER — PROPOFOL 10 MG/ML IV BOLUS
INTRAVENOUS | Status: DC | PRN
  Administered 2024-04-26: 10 mg via INTRAVENOUS
  Administered 2024-04-26: 30 mg via INTRAVENOUS
  Administered 2024-04-26: 20 mg via INTRAVENOUS
  Administered 2024-04-26: 40 mg via INTRAVENOUS
  Administered 2024-04-26: 150 ug/kg/min via INTRAVENOUS

## 2024-04-26 MED ORDER — SODIUM CHLORIDE 0.9 % IV SOLN: INTRAVENOUS | Status: DC

## 2024-04-26 MED ORDER — EPHEDRINE SULFATE-NACL 50-0.9 MG/10ML-% IV SOSY
PREFILLED_SYRINGE | INTRAVENOUS | Status: DC | PRN
  Administered 2024-04-26: 10 mg via INTRAVENOUS

## 2024-04-26 NOTE — Anesthesia Postprocedure Evaluation (Signed)
 Anesthesia Post Note  Patient: Eric Rhodes  Procedure(s) Performed: TRANSESOPHAGEAL ECHOCARDIOGRAM     Patient location during evaluation: Cath Lab Anesthesia Type: MAC Level of consciousness: awake and alert Pain management: pain level controlled Vital Signs Assessment: post-procedure vital signs reviewed and stable Respiratory status: spontaneous breathing, nonlabored ventilation, respiratory function stable and patient connected to nasal cannula oxygen Cardiovascular status: blood pressure returned to baseline and stable Postop Assessment: no apparent nausea or vomiting Anesthetic complications: no   No notable events documented.  Last Vitals:  Vitals:   04/26/24 1305 04/26/24 1310  BP: 123/74 (!) 104/52  Pulse: (!) 58 (!) 42  Resp: 16 (!) 9  Temp:    SpO2: 97% 96%    Last Pain:  Vitals:   04/26/24 1240  TempSrc:   PainSc: 0-No pain                 Garnette LABOR Gab

## 2024-04-26 NOTE — CV Procedure (Signed)
 INDICATIONS: MR  PROCEDURE:   Informed consent was obtained prior to the procedure. The risks, benefits and alternatives for the procedure were discussed and the patient comprehended these risks.  Risks include, but are not limited to, cough, sore throat, vomiting, nausea, somnolence, esophageal and stomach trauma or perforation, bleeding, low blood pressure, aspiration, pneumonia, infection, trauma to the teeth and death.    After a procedural time-out, the oropharynx was anesthetized with 20% benzocaine spray.   During this procedure the patient was administered propofol  per anesthesia.  The patient's heart rate, blood pressure, and oxygen saturation were monitored continuously during the procedure. The period of conscious sedation was 20 minutes, of which I was present face-to-face 100% of this time.  The transesophageal probe was inserted in the esophagus and stomach without difficulty and multiple views were obtained.  The patient was kept under observation until the patient left the procedure room.  The patient left the procedure room in stable condition.   Agitated microbubble saline contrast was administered.  COMPLICATIONS:    There were no immediate complications.  FINDINGS:   FORMAL ECHOCARDIOGRAM REPORT PENDING Normal LV function, 65% Moderate MR at normal blood pressure, mild to moderate with hypotension. Mildly degenerative mitral valve with no prolapse or flail. Trivial TR, normal Aortic valve. No PFO. Frequent PVCs throughout study.      RECOMMENDATIONS:     Obstructing during sedation, will order sleep study.  Will evaluate for ischemia with CCTA. Will likely cancel MR bike stress test and send to EP for evaluation of PVCs.  Time Spent Directly with the Patient:  45 minutes   Aaliah Jorgenson A Chelcie Estorga 04/26/2024, 12:34 PM

## 2024-04-26 NOTE — Transfer of Care (Signed)
 Immediate Anesthesia Transfer of Care Note  Patient: Eric Rhodes  Procedure(s) Performed: TRANSESOPHAGEAL ECHOCARDIOGRAM  Patient Location: PACU  Anesthesia Type:MAC  Level of Consciousness: oriented and patient cooperative  Airway & Oxygen Therapy: Patient Spontanous Breathing and Patient connected to nasal cannula oxygen  Post-op Assessment: Report given to RN, Post -op Vital signs reviewed and stable, Patient moving all extremities X 4, and Patient able to stick tongue midline  Post vital signs: Reviewed and stable  Last Vitals:  Vitals Value Taken Time  BP 111/69 04/26/24 12:40  Temp 97.9   Pulse 52 04/26/24 12:41  Resp 17 04/26/24 12:41  SpO2 98 % 04/26/24 12:41  Vitals shown include unfiled device data.  Last Pain:  Vitals:   04/26/24 1039  TempSrc:   PainSc: 0-No pain         Complications: No notable events documented.

## 2024-04-26 NOTE — Interval H&P Note (Signed)
 History and Physical Interval Note:  04/26/2024 11:50 AM  Eric Rhodes LABOR Eric Rhodes  has presented today for surgery, with the diagnosis of MITRAL VALVE REGURGITATION.  The various methods of treatment have been discussed with the patient and family. After consideration of risks, benefits and other options for treatment, the patient has consented to  Procedure(s): TRANSESOPHAGEAL ECHOCARDIOGRAM (N/A) as a surgical intervention.  The patient's history has been reviewed, patient examined, no change in status, stable for surgery.  I have reviewed the patient's chart and labs.  Questions were answered to the patient's satisfaction.     Daviel Allegretto A Jonathon Castelo

## 2024-04-26 NOTE — Telephone Encounter (Signed)
 CCTA order entered, per Dr. Loni. No pre meds needed.

## 2024-04-27 ENCOUNTER — Telehealth (HOSPITAL_COMMUNITY): Payer: Self-pay | Admitting: Emergency Medicine

## 2024-04-27 ENCOUNTER — Encounter (HOSPITAL_COMMUNITY): Payer: Self-pay

## 2024-04-27 NOTE — Telephone Encounter (Signed)
 Reaching out to patient to offer assistance regarding upcoming cardiac imaging study; pt verbalizes understanding of appt date/time, parking situation and where to check in, pre-test NPO status and medications ordered, and verified current allergies; name and call back number provided for further questions should they arise Camie Shutter RN Navigator Cardiac Imaging [facility] [phone] office [phone] cell  Hold losartan , tadalafil

## 2024-04-28 NOTE — Telephone Encounter (Signed)
 Call x1; lvmtcb to schedule referral w/ EP MD d/t >20% PVC burden on recent heart monitor, per Dr. Loni.

## 2024-04-29 ENCOUNTER — Encounter (HOSPITAL_COMMUNITY): Payer: Self-pay

## 2024-04-29 ENCOUNTER — Ambulatory Visit (HOSPITAL_COMMUNITY)

## 2024-04-29 ENCOUNTER — Other Ambulatory Visit: Payer: Self-pay | Admitting: Internal Medicine

## 2024-04-29 ENCOUNTER — Encounter: Payer: Self-pay | Admitting: Internal Medicine

## 2024-04-29 ENCOUNTER — Ambulatory Visit (HOSPITAL_COMMUNITY): Admission: RE | Admit: 2024-04-29 | Source: Ambulatory Visit | Attending: Cardiology | Admitting: Cardiology

## 2024-04-29 DIAGNOSIS — R0683 Snoring: Secondary | ICD-10-CM

## 2024-04-29 DIAGNOSIS — G4730 Sleep apnea, unspecified: Secondary | ICD-10-CM

## 2024-04-30 ENCOUNTER — Ambulatory Visit (HOSPITAL_COMMUNITY)
Admission: RE | Admit: 2024-04-30 | Discharge: 2024-04-30 | Disposition: A | Discharge status: Home / Self Care | Source: Ambulatory Visit | Attending: Internal Medicine | Admitting: Internal Medicine

## 2024-04-30 ENCOUNTER — Encounter: Payer: Self-pay | Admitting: Internal Medicine

## 2024-04-30 DIAGNOSIS — I2510 Atherosclerotic heart disease of native coronary artery without angina pectoris: Secondary | ICD-10-CM | POA: Diagnosis present

## 2024-04-30 DIAGNOSIS — R002 Palpitations: Secondary | ICD-10-CM | POA: Insufficient documentation

## 2024-04-30 DIAGNOSIS — I493 Ventricular premature depolarization: Secondary | ICD-10-CM | POA: Diagnosis present

## 2024-04-30 DIAGNOSIS — R931 Abnormal findings on diagnostic imaging of heart and coronary circulation: Secondary | ICD-10-CM | POA: Diagnosis present

## 2024-04-30 MED ORDER — NITROGLYCERIN 0.4 MG SL SUBL
0.8000 mg | SUBLINGUAL_TABLET | Freq: Once | SUBLINGUAL | Status: AC
  Administered 2024-04-30: 0.8 mg via SUBLINGUAL

## 2024-04-30 MED ORDER — IOHEXOL 350 MG/ML SOLN
100.0000 mL | Freq: Once | INTRAVENOUS | Status: AC | PRN
  Administered 2024-04-30: 100 mL via INTRAVENOUS

## 2024-05-03 ENCOUNTER — Other Ambulatory Visit: Payer: Self-pay | Admitting: Internal Medicine

## 2024-05-03 ENCOUNTER — Ambulatory Visit (HOSPITAL_BASED_OUTPATIENT_CLINIC_OR_DEPARTMENT_OTHER)
Admission: RE | Admit: 2024-05-03 | Discharge: 2024-05-03 | Disposition: A | Discharge status: Home / Self Care | Source: Ambulatory Visit | Attending: Internal Medicine

## 2024-05-03 ENCOUNTER — Ambulatory Visit: Payer: Self-pay | Admitting: Internal Medicine

## 2024-05-03 DIAGNOSIS — E785 Hyperlipidemia, unspecified: Secondary | ICD-10-CM

## 2024-05-03 DIAGNOSIS — R931 Abnormal findings on diagnostic imaging of heart and coronary circulation: Secondary | ICD-10-CM | POA: Diagnosis not present

## 2024-05-03 DIAGNOSIS — I340 Nonrheumatic mitral (valve) insufficiency: Secondary | ICD-10-CM

## 2024-05-03 DIAGNOSIS — I493 Ventricular premature depolarization: Secondary | ICD-10-CM

## 2024-05-12 ENCOUNTER — Encounter: Payer: Self-pay | Admitting: Neurology

## 2024-05-12 ENCOUNTER — Encounter: Payer: Self-pay | Admitting: Internal Medicine

## 2024-05-19 ENCOUNTER — Telehealth: Payer: Self-pay

## 2024-05-19 ENCOUNTER — Encounter: Payer: Self-pay | Admitting: Internal Medicine

## 2024-05-19 NOTE — Telephone Encounter (Signed)
   Pre-operative Risk Assessment    Patient Name: Eric Rhodes  DOB: Aug 25, 1956 MRN: [id number]   Date of last office visit: 04/09/2024 Date of next office visit: 06/11/24   Request for Surgical Clearance    Procedure:  Lumbar Spine: Left L4 Hemilaminectomy and L4-L5 partial Facetectomy with Navigation Guidance  Date of Surgery:  Clearance 06/09/24                                 Surgeon:  Felecia Ruffing, MD Surgeon's Group or Practice Name:  [facility]  Phone number:  [phone] Fax number:  [phone]   Type of Clearance Requested:   - Medical  - Pharmacy:  Hold Aspirin      Type of Anesthesia:  General    Additional requests/questions:  N/A  SignedMerlynn LITTIE Essex   05/19/2024, 1:34 PM

## 2024-05-21 ENCOUNTER — Ambulatory Visit
Attending: Student in an Organized Health Care Education/Training Program | Admitting: Student in an Organized Health Care Education/Training Program

## 2024-05-21 ENCOUNTER — Encounter: Payer: Self-pay | Admitting: Student in an Organized Health Care Education/Training Program

## 2024-05-21 VITALS — BP 108/50 | HR 66 | Ht 63.5 in | Wt 154.2 lb

## 2024-05-21 DIAGNOSIS — Z01812 Encounter for preprocedural laboratory examination: Secondary | ICD-10-CM | POA: Diagnosis present

## 2024-05-21 DIAGNOSIS — I2510 Atherosclerotic heart disease of native coronary artery without angina pectoris: Secondary | ICD-10-CM | POA: Insufficient documentation

## 2024-05-21 DIAGNOSIS — I493 Ventricular premature depolarization: Secondary | ICD-10-CM | POA: Diagnosis present

## 2024-05-21 NOTE — Progress Notes (Addendum)
 Cardiology Office Note   Date:  05/22/2024  ID:  Eric Rhodes, DOB 15-Nov-1955, MRN [id number] PCP: Cleotilde Planas, MD  [facility] Providers Cardiologist:  Soyla DELENA Merck, MD Electrophysiologist:  Donnice DELENA Primus, MD   History of Present Illness Kamil Mchaffie Gasbarro is a 68 y.o. 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  He had hyperlipidemia and was trying to get on a better exercise regimen to lose weight and was drinking near a pot of coffee a day.  Even with illumination of caffeine his palpitations persisted.  Lab work with normal electrolytes, thyroid  function, and no anemia.  He started taking metoprolol  twice daily however he has baseline bradycardia at rest.  He had a TTE in 2018 which showed trace MR.  Follow-up TEE with moderate MR this year.  He regularly has prolonged bike rides and sometimes biking up to 40 miles.  He noticed that on his Garmin his heart rate was not able to augment like it had in the past.  He would often be Down at 140s instead of 170s max exertion.  His wife was with him in the office today.  She has been an Tree surgeon for over 20 years.  With his retirement they are planning to go [place] for her trip for her to teach and for him to complete a bike race.  He wants his PVCs dealt with to decrease symptoms and allow better endurance during his training.  ROS: palpitations, SOB  Studies Reviewed  ECG  Result date: 05/21/24 NSR 65, PR 150, QRS 92, QT/c 402/418 Frequent unifocal PVCs, LBBB, left inferior axis with V4 transition, c/w RVOT origin   CT FFR Result date: 05/03/24 1.  Left Main:  minimum lesion-specific FFRct of 0.98.   2. LAD: Minimum lesion-specific FFRct of 0.91 (D1) and 0.91 (mid LAD). 3. LCX: Minimum lesion-specific FFRct of 0.97 (OM2). 4. RCA: Minimum lesion-specific FFRct of 0.96 (mid RCA). The proximal RCA FFRct value is 0.98.   IMPRESSION: 1. CT FFR analysis shows no evidence of significant functional stenosis.     TEE Result date: 04/26/24  1. Rhythm strip during this exam demonstrates frequent premature  ventricular contractions.   2. Left ventricular ejection fraction, by estimation, is 60 to 65%. The  left ventricle has normal function.   3. Right ventricular systolic function is normal. The right ventricular  size is mildly enlarged.   4. Left atrial size was mildly dilated. No left atrial/left atrial  appendage thrombus was detected. The LAA emptying velocity was 52 cm/s.   5. Right atrial size was mildly dilated.   6. The mitral valve is mildly degenerative. Moderate mitral valve  regurgitation. No evidence of mitral stenosis. No apparent prolapse or  flail.   7. The aortic valve is tricuspid. Aortic valve regurgitation is trivial.  No aortic stenosis is present.   8. There is mild (Grade II) plaque involving the aortic arch and  descending aorta.   9. Agitated saline contrast bubble study was negative, with no evidence  of any interatrial shunt.  10. 3D performed of the mitral valve and demonstrates grossly normal,  mildly degenerative mitral valve with  moderate MR between A1/P1 and A3/P3.   4 day monitor  Result date: 04/24/24   >20% burdent of PVCs. Symptoms of chest pressure and lightheadedness associated with PVCs.   10 beat run of NSVT rate 158 bpm.     Physical Exam VS:  BP (!) 108/50 (BP Location: Right Arm, Patient Position: Sitting, Cuff Size: Normal)   Pulse 66   Ht 5' 3.5 (1.613 m)   Wt 154 lb 3.2 oz (69.9 kg)   SpO2 98%   BMI 26.89 kg/m       Wt Readings from Last 3 Encounters:  05/21/24 154 lb 3.2 oz  (69.9 kg)  04/26/24 156 lb (70.8 kg)  04/09/24 158 lb (71.7 kg)    GEN: Well nourished, well developed in no acute distress NECK: No JVD  CARDIAC: regular rate, irregular rhythm, no murmurs, rubs, gallops RESPIRATORY:  Clear to auscultation without rales, wheezing or rhonchi  EXTREMITIES:  No edema; No deformity  Skin: b/l healed knee incisions   ASSESSMENT AND PLAN Heaton Sarin Kilfoyle is a 68 y.o. 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  We discussed different options for management including rhythm medications versus ablation.  He strongly preference ablation over medications if possible.  We reviewed the main 3 reasons that PVC ablation can sometimes be troublesome from a procedural standpoint.  These include lack of PVC burden, thickness of myocardial tissue, and inability to access the earliest origin of the PVC as opposed to the exit site.  I think there is a high chance of success for his PVCs which are frequent, unifocal, and likely originate from the RVOT.  He is planning on having a minimally invasive hemilaminectomy on October 8 for chronic radicular back pain.  Recovery from this is fairly quick as it is done with an endoscope.  He thought recovery would be less than 2 weeks.  I did ask that he discuss further with his surgeon in case we needed to map the LVOT and heparinize during the procedure.  He is wanting to get this done before he and his wife leave for [place]  for the winter.  Will plan for the first week of November and less needs to be delayed further from his surgeon standpoint.  Discussed treatment options today for his PVC including antiarrhythmic drug therapy  and ablation. Discussed risks, recovery and likelihood of success with each treatment strategy. Risk, benefits, and alternatives to EP study and ablation for PVCs were discussed. These risks include but are not limited to stroke, bleeding, vascular damage, tamponade, perforation, damage to lungs, phrenic nerve and other structures, worsening renal function, coronary vasospasm and death.  Discussed potential need for repeat ablation procedures and antiarrhythmic drugs after an initial ablation. The patient understands these risk and wishes to proceed.  We will therefore proceed with catheter ablation the first week of October. Carto, ICE and anesthesia are requested for the procedure.    Pre procedural overview Carto, ICE and anesthesia are requested for the procedure. No CT needed. Hold metoprolol  5 days prior. All other meds give the day prior but hold the morning of procedure.    Dispo: f/u post ablation   A total of 50 minutes was spent  preparing for the patient, reviewing history, performing exam, documenting encounter, and counseling the patient and his wife.  40 minutes of direct patient care.  Signed, Donnice DELENA Primus, MD

## 2024-05-21 NOTE — Patient Instructions (Signed)
 Medication Instructions:  Your physician recommends that you continue on your current medications as directed. Please refer to the Current Medication list given to you today.  *If you need a refill on your cardiac medications before your next appointment, please call your pharmacy*  Lab Work: CBC and BMET - please have pre-procedure lab work completed on Friday, 06/25/2024 . This can be done at ANY [facility] near you - no appointment required and this does not have to be fasting. If you have labs (blood work) drawn today and your tests are completely normal, you will receive your results only by: MyChart Message (if you have MyChart) OR A paper copy in the mail If you have any lab test that is abnormal or we need to change your treatment, we will call you to review the results.  Testing/Procedures:   PVC Ablation - scheduled on Friday, 07/09/2024 We will be in contact closer to your ablation date with further instructions Your physician has recommended that you have an ablation. Catheter ablation is a medical procedure used to treat some cardiac arrhythmias (irregular heartbeats). During catheter ablation, a long, thin, flexible tube is put into a blood vessel in your groin (upper thigh), or neck. This tube is called an ablation catheter. It is then guided to your heart through the blood vessel. Radio frequency waves destroy small areas of heart tissue where abnormal heartbeats may cause an arrhythmia to start. Please see the instruction sheet given to you today.  Follow-Up: At [facility], you and your health needs are our priority.  As part of our continuing mission to provide you with exceptional heart care, our providers are all part of one team.  This team includes your primary Cardiologist (physician) and Advanced Practice Providers or APPs (Physician Assistants and Nurse Practitioners) who all work together to provide you with the care you need, when you need it.  Your next  appointment:   We will schedule follow up after your ablation  Provider:   Dr Almetta   Cardiac Ablation Cardiac ablation is a procedure to destroy, or ablate, a small amount of heart tissue that is causing problems. The heart has many electrical connections. Sometimes, these connections are abnormal and can cause the heart to beat very fast or irregularly. Ablating the abnormal areas can improve the heart's rhythm or return it to normal. Ablation may be done for people who: Have irregular or rapid heartbeats (arrhythmias). Have Wolff-Parkinson-White syndrome. Have taken medicines for an arrhythmia that did not work or caused side effects. Have a high-risk heartbeat that may be life-threatening. Tell a health care provider about: Any allergies you have. All medicines you are taking, including vitamins, herbs, eye drops, creams, and over-the-counter medicines. Any problems you or family members have had with anesthesia. Any bleeding problems you have. Any surgeries you have had. Any medical conditions you have. Whether you are pregnant or may be pregnant. What are the risks? Your health care provider will talk with you about risks. These may include: Infection. Bruising and bleeding. Stroke or blood clots. Damage to nearby structures or organs. Allergic reaction to medicines or dyes. Needing a pacemaker if the heart gets damaged. A pacemaker is a device that helps the heart beat normally. Failure of the procedure. A repeat procedure may be needed. What happens before the procedure? Medicines Ask your health care provider about: Changing or stopping your regular medicines. These include any heart rhythm medicines, diabetes medicines, or blood thinners you take. Taking medicines such as  aspirin  and ibuprofen. These medicines can thin your blood. Do not take them unless your health care provider tells you to. Taking over-the-counter medicines, vitamins, herbs, and  supplements. General instructions Follow instructions from your health care provider about what you may eat and drink. If you will be going home right after the procedure, plan to have a responsible adult: Take you home from the hospital or clinic. You will not be allowed to drive. Care for you for the time you are told. Ask your health care provider what steps will be taken to prevent infection. What happens during the procedure?  An IV will be inserted into one of your veins. You may be given: A sedative. This helps you relax. Anesthesia. This will: Numb certain areas of your body. An incision will be made in your neck or your groin. A needle will be inserted through the incision and into a large vein in your neck or groin. The small, thin tube (catheter) will be inserted through the needle and moved to your heart. A type of X-ray (fluoroscopy) will be used to help guide the catheter and provide images of the heart on a monitor. Dye may be injected through the catheter to help your surgeon see the area of the heart that needs treatment. Electrical currents will be sent from the catheter to destroy heart tissue in certain areas. There are three types of energy that may be used to do this: Heat (radiofrequency energy). Laser energy. Extreme cold (cryoablation). When the tissue has been destroyed, the catheter will be removed. Pressure will be held on the insertion area to prevent bleeding. A bandage (dressing) will be placed over the insertion area. The procedure may vary among health care providers and hospitals. What happens after the procedure? Your blood pressure, heart rate and rhythm, breathing rate, and blood oxygen level will be monitored until you leave the hospital or clinic. Your insertion area will be checked for bleeding. You will need to lie still for a few hours. If your groin was used, you will need to keep your leg straight for a few hours after the catheter is  removed. This information is not intended to replace advice given to you by your health care provider. Make sure you discuss any questions you have with your health care provider. Document Revised: 02/05/2022 Document Reviewed: 02/05/2022 Elsevier Patient Education  2024 [employer].

## 2024-05-21 NOTE — Progress Notes (Deleted)
  Cardiology Office Note   Date:  05/21/2024  ID:  Reyes LABOR Moman, DOB 27-Mar-1956, MRN [id number] PCP: Cleotilde Planas, MD  [facility] Providers Cardiologist:  Soyla LABOR Merck, MD { Click to update primary MD,subspecialty MD or APP then REFRESH:1}    History of Present Illness Malakhai Beitler Vazquez is a 68 y.o. male ***  ROS: ***  Studies Reviewed      *** Risk Assessment/Calculations {Does this patient have ATRIAL FIBRILLATION?[phone]}         Physical Exam VS:  BP (!) 108/50 (BP Location: Right Arm, Patient Position: Sitting, Cuff Size: Normal)   Pulse 66   Ht 5' 3.5 (1.613 m)   Wt 154 lb 3.2 oz (69.9 kg)   SpO2 98%   BMI 26.89 kg/m        Wt Readings from Last 3 Encounters:  05/21/24 154 lb 3.2 oz (69.9 kg)  04/26/24 156 lb (70.8 kg)  04/09/24 158 lb (71.7 kg)    GEN: Well nourished, well developed in no acute distress NECK: No JVD; No carotid bruits CARDIAC: ***RRR, no murmurs, rubs, gallops RESPIRATORY:  Clear to auscultation without rales, wheezing or rhonchi  ABDOMEN: Soft, non-tender, non-distended EXTREMITIES:  No edema; No deformity   ASSESSMENT AND PLAN ***    {Are you ordering a CV Procedure (e.g. stress test, cath, DCCV, TEE, etc)?   Press F2        :[id number]}  Dispo: ***  Signed, Donnice LABOR Primus, MD

## 2024-05-21 NOTE — Telephone Encounter (Signed)
   Patient Name: Eric Rhodes  DOB: Aug 21, 1956 MRN: [id number]  Primary Cardiologist: Soyla DELENA Merck, MD  Chart reviewed as part of pre-operative protocol coverage. Pt was recently seen in office on 04/09/2024 by Dr. Merck.   Per Dr. Acharya, The patient is intermediate risk for high risk procedure.  No further cardiovascular testing is required prior to the procedure.  If this level of risk is acceptable to the patient and surgical team, the patient should be considered optimized from a cardiovascular standpoint. Risk level contributed to by age and stable chronic cardiovascular conditions of frequent PVCs, moderate CAD and moderate MR.   Medications: ok to hold aspirin  for 7-10 days prior to procedure and resume when safe from neurosurgical standpoint. I will route this recommendation to the requesting party via Epic fax function and remove from pre-op pool.  Please call with questions.  Damien JAYSON Braver, NP 05/21/2024, 1:30 PM

## 2024-05-25 ENCOUNTER — Encounter: Payer: Self-pay | Admitting: Internal Medicine

## 2024-05-25 ENCOUNTER — Institutional Professional Consult (permissible substitution): Admitting: Student in an Organized Health Care Education/Training Program

## 2024-05-25 NOTE — Anesthesia Preprocedure Evaluation (Addendum)
 Patient: Eric Rhodes  Procedure Summary     Date/Time: 06/09/24 1334   Procedure: LEFT L4  HEMILAMINECTOMY AND L4 L5 PARTIAL FACETECTOMY WITH NAVIGATION GUIDANCE (Left: Spine Lumbar)   Location: [facility] OR ROOM 01 / [facility]   Surgeons: Jizijbn Ellouise Ruffing, MD       ANESTHESIA EVALUATION   Patient summary reviewed and Nursing notes reviewed No history of anesthetic complications   Pulmonary:  No Smoking History Sleep Apnea; on CPAP  ECG reviewed Exercise tolerance: good (No CP or SOB with one FOS.  )  Cardiovascular:  History of Dysrhythmia (PVCs with palpitations),History of HypertensionHistory of Heart Murmur or Valve Problems (mod MR), Murmur Type: MR, History of CAD  Comments: Cardiology OV 05/21/2024 (CE) ECG  Result date: 05/21/24 NSR 65, PR 150, QRS 92, QT/c 402/418 Frequent unifocal PVCs, LBBB, left inferior axis with V4 transition, c/w RVOT origin   CT FFR Result date: 05/03/24 1. Left Main: minimum lesion-specific FFRct of 0.98. 2. LAD: Minimum lesion-specific FFRct of 0.91 (D1) and 0.91 (mid LAD). 3. LCX: Minimum lesion-specific FFRct of 0.97 (OM2). 4. RCA: Minimum lesion-specific FFRct of 0.96 (mid RCA). The proximal RCA FFRct value is 0.98.  IMPRESSION: 1. CT FFR analysis shows no evidence of significant functional stenosis.  TEE Result date: 04/26/24 1. Rhythm strip during this exam demonstrates frequent premature  ventricular contractions.  2. Left ventricular ejection fraction, by estimation, is 60 to 65%. The  left ventricle has normal function.  3. Right ventricular systolic function is normal. The right ventricular  size is mildly enlarged.  4. Left atrial size was mildly dilated. No left atrial/left atrial  appendage thrombus was detected. The LAA emptying velocity was 52 cm/s.  5. Right atrial size was mildly dilated.  6. The mitral valve is mildly degenerative. Moderate mitral valve  regurgitation. No evidence of mitral stenosis. No  apparent prolapse or flail.  7. The aortic valve is tricuspid. Aortic valve regurgitation is trivial.  No aortic stenosis is present.  8. There is mild (Grade II) plaque involving the aortic arch and  descending aorta.  9. Agitated saline contrast bubble study was negative, with no evidence of any interatrial shunt.  10. 3D performed of the mitral valve and demonstrates grossly normal, mildly degenerative mitral valve with moderate MR between A1/P1 and A3/P3.   4 day monitor  Result date: 04/24/24 >20% burdent of PVCs. Symptoms of chest pressure and lightheadedness associated with PVCs. 10 beat run of NSVT rate 158 bpm.  ECHO 03/2024 (CE) 1. Left ventricular ejection fraction, by estimation, is 60 to 65%. Left  ventricular ejection fraction by 3D volume is 61 %. The left ventricle has  normal function. The left ventricle has no regional wall motion  abnormalities. Left ventricular diastolic  parameters were normal.  2. Right ventricular systolic function is normal. The right ventricular  size is normal. There is normal pulmonary artery systolic pressure. The  estimated right ventricular systolic pressure is 32.6 mmHg.  3. The mitral valve is normal in structure. There are two MR jets  possibly originating at A1/P1 and A3/P3; all together MR appears to be moderate.  4. The aortic valve is normal in structure. Aortic valve regurgitation is not visualized. No aortic stenosis is present.  5. The inferior vena cava is normal in size with greater than 50%  respiratory variability, suggesting right atrial pressure of 3 mmHg.  6. Left atrial size was mildly dilated.     Neuro/Psych:  (+)  neuromuscular disease,  psychiatric history (depression)  GI/Hepatic:  History of GERD; well controlled  GU/Renal: - negative   Endo/Other: - negative ROS  Musculoskeletal:   Rheum/Derm/Immuno:  History of Arthritis  Hematology/Oncology: - negative   Infectious Disease:    OB/L&D:     Relevant Problems  No relevant active problems    Clinical information reviewed:      Physical Exam Airway Mallampati: II TM distance: >3 FB Neck ROM: full  Cardiovascular  Rhythm: regular Rate: normal rate  Dental  Pulmonary Breath sounds clear to auscultation  Neurological  He appears awake, alert and oriented x3.  Abdominal     Anesthesia Plan  ASA 3    general    Planned airway type is oral.  Planned airway equipment includes endotracheal tube.  Patient will be monitored via additional IV.    intravenous induction

## 2024-05-27 ENCOUNTER — Telehealth: Payer: Self-pay

## 2024-05-27 NOTE — Telephone Encounter (Signed)
 Dr. Almetta called and spoke with pt. His PVC Ablation has been moved from 11/7 to 11/19 at 7:30 am.   He addressed some concerns the pt had and answered all of his questions.   Pt is aware that I will send his Instruction letter soon with all the details of his procedure.

## 2024-06-07 ENCOUNTER — Encounter (HOSPITAL_COMMUNITY): Payer: Self-pay

## 2024-06-08 ENCOUNTER — Other Ambulatory Visit: Payer: Self-pay | Admitting: Internal Medicine

## 2024-06-08 ENCOUNTER — Ambulatory Visit (HOSPITAL_COMMUNITY)
Admission: RE | Admit: 2024-06-08 | Discharge: 2024-06-08 | Disposition: A | Discharge status: Home / Self Care | Source: Ambulatory Visit | Attending: Internal Medicine | Admitting: Internal Medicine

## 2024-06-08 DIAGNOSIS — I340 Nonrheumatic mitral (valve) insufficiency: Secondary | ICD-10-CM

## 2024-06-08 DIAGNOSIS — I493 Ventricular premature depolarization: Secondary | ICD-10-CM | POA: Insufficient documentation

## 2024-06-08 MED ORDER — GADOBUTROL 1 MMOL/ML IV SOLN
10.0000 mL | Freq: Once | INTRAVENOUS | Status: AC | PRN
  Administered 2024-06-08: 10 mL via INTRAVENOUS

## 2024-06-10 ENCOUNTER — Ambulatory Visit: Payer: Self-pay | Admitting: Internal Medicine

## 2024-06-11 ENCOUNTER — Ambulatory Visit: Admitting: Pharmacist Clinician (PhC)/ Clinical Pharmacy Specialist

## 2024-06-14 ENCOUNTER — Telehealth: Payer: Self-pay | Admitting: Student in an Organized Health Care Education/Training Program

## 2024-06-14 NOTE — Telephone Encounter (Signed)
 Pt wants to discuss ablation with nurse Aldona

## 2024-06-15 NOTE — Telephone Encounter (Signed)
 Spoke with pt who states his back surgery was moved to yesterday, 06/14/2024.  Pt asking if Dr Almetta would like his  ablation moved 5+ days out from 11/19 date to meet the 6 weeks request his surgeon had requested.  Pt advised will discuss with Dr Almetta.  Pt thanked Charity fundraiser for the callback.

## 2024-06-22 ENCOUNTER — Telehealth: Payer: Self-pay

## 2024-06-22 NOTE — Telephone Encounter (Signed)
-----   Message from [facility] Chia Mowers G sent at 06/22/2024  4:19 PM EDT ----- Regarding: FW: 11/19 - PVC Ablation Pt's procedure has been moved to 12/3.... ----- Message ----- From: Dreama Earing, CMA Sent: 06/10/2024  11:19 AM EDT To: Scherrie Seneca, CMA Subject: 11/19 - PVC Ablation                            ----- Message ----- From: Casimir Aldona BRAVO, RN Sent: 05/25/2024   5:09 PM EDT To: Woodward Klem, CMA; [place] MALVA Sever, RN;# Subject: 07/09/24 - 730- [place]                        Important: list procedure date as first item in subject line, followed by procedure type (e.g., 05/14/24 AFib ablation)  Precert:  MD: Almetta Type of ablation: PVC Diagnosis: PVC CPT code: PVC Ablation scheduled (date/time): 11/7 730am  Procedure: PVC ablation  Added to calendar? Yes Orders entered? No, >30 days before procedure Letter complete? No, >30 days before procedure Scheduled with cath lab? Yes Any medications to hold? Yes (please list hold instructions): Metoprolol  x 5 days Labs ordered (CBC, BMET, PT/INR if on warfarin): Yes Mapping system: CARTO (lab 4 or 6) CARTO/OPAL rep notified? Yes Cardiac CT needed? No Dye allergy? N/A Pre-meds ordered and instructions given? N/A Letter method: MyChart H&P: 9/19 Device: No  Follow-up:  Cassie/Angel, please schedule Routine.  Covering RN - please send this message to [employer], EP scheduler, EP Scheduling pool, EP [employer], and CT scheduler ([place] Lynch/Stephanie Mogg), if indicated.

## 2024-06-23 ENCOUNTER — Telehealth: Payer: Self-pay

## 2024-06-24 ENCOUNTER — Other Ambulatory Visit: Payer: Self-pay | Admitting: Internal Medicine

## 2024-06-24 DIAGNOSIS — E785 Hyperlipidemia, unspecified: Secondary | ICD-10-CM

## 2024-06-25 NOTE — Telephone Encounter (Signed)
 SABRA

## 2024-06-28 ENCOUNTER — Encounter: Payer: Self-pay | Admitting: Internal Medicine

## 2024-06-30 ENCOUNTER — Other Ambulatory Visit: Payer: Self-pay | Admitting: Pharmacist

## 2024-06-30 NOTE — Addendum Note (Signed)
 Addended by: LORRENE FEDERICO CROME on: 06/30/2024 08:20 AM   Modules accepted: Orders

## 2024-07-12 ENCOUNTER — Encounter: Admitting: Neurology

## 2024-07-14 ENCOUNTER — Encounter (HOSPITAL_COMMUNITY): Payer: Self-pay

## 2024-07-14 ENCOUNTER — Telehealth (HOSPITAL_COMMUNITY): Payer: Self-pay

## 2024-07-14 NOTE — Telephone Encounter (Signed)
 Spoke with patient to complete pre-procedure call.     Health status review:  Any new medical conditions, recent signs of acute illness or been started on antibiotics? No Any recent hospitalizations or surgeries? Yes; 06/14/24- Lumbar surgery Any new medications started since pre-op visit? No  Follow all medication instructions prior to procedure or the procedure may be rescheduled:    HOLD: Tirzepatide (Mounjaro, Zepbound) for 1 week prior to the procedure. Last dose on (or before) Tuesday, November 25. Patient reports he has already held medication.  HOLD: Metoprolol  for 5 days prior to the procedure. Last dose will be on Thursday, November 27. Essential chronic medications:  No medication should be continued, unless told otherwise.  On the morning of your procedure DO NOT take any medication.  Nothing to eat or drink after midnight prior to your procedure.  Pre-procedure testing scheduled: lab work by November 19.  Confirmed patient is scheduled for PVC Ablation on Wednesday, December 3 with Dr. Almetta. Instructed patient to arrive at the Main Entrance A at [facility]: [address] [place], [place] [address] and check in at Admitting at 8:30 AM.  Plan to go home the same day, you will only stay overnight if medically necessary. You MUST have a responsible adult to drive you home and MUST be with you the first 24 hours after you arrive home or your procedure could be cancelled.  Informed a nurse may call a day before the procedure to confirm arrival time and ensure instructions are followed.  Patient verbalized understanding to information provided and is agreeable to proceed with procedure.   Advised to contact RN Navigator at [phone], to inform of any new medications started after call or concerns prior to procedure.

## 2024-07-19 LAB — CBC

## 2024-07-20 LAB — CBC
Hematocrit: 43.3 % (ref 37.5–51.0)
Hemoglobin: 14.3 g/dL (ref 13.0–17.7)
MCH: 31.8 pg (ref 26.6–33.0)
MCHC: 33 g/dL (ref 31.5–35.7)
MCV: 96 fL (ref 79–97)
Platelets: 208 x10E3/uL (ref 150–450)
RBC: 4.49 x10E6/uL (ref 4.14–5.80)
RDW: 12.2 % (ref 11.6–15.4)
WBC: 6 x10E3/uL (ref 3.4–10.8)

## 2024-07-20 LAB — BASIC METABOLIC PANEL WITH GFR
BUN/Creatinine Ratio: 13 (ref 10–24)
BUN: 15 mg/dL (ref 8–27)
CO2: 23 mmol/L (ref 20–29)
Calcium: 9.3 mg/dL (ref 8.6–10.2)
Chloride: 102 mmol/L (ref 96–106)
Creatinine, Ser: 1.12 mg/dL (ref 0.76–1.27)
Glucose: 97 mg/dL (ref 70–99)
Potassium: 4.8 mmol/L (ref 3.5–5.2)
Sodium: 138 mmol/L (ref 134–144)
eGFR: 71 mL/min/1.73 (ref >59)

## 2024-08-03 NOTE — Anesthesia Preprocedure Evaluation (Signed)
 Anesthesia Evaluation  Patient identified by MRN, date of birth, ID band Patient awake    Reviewed: Allergy & Precautions, NPO status , Patient's Chart, lab work & pertinent test results  History of Anesthesia Complications Negative for: history of anesthetic complications  Airway Mallampati: II  TM Distance: >3 FB Neck ROM: Full    Dental  (+) Dental Advisory Given   Pulmonary sleep apnea and Continuous Positive Airway Pressure Ventilation    breath sounds clear to auscultation       Cardiovascular hypertension, Pt. on medications and Pt. on home beta blockers + CAD (non-obstructive by coronary CT)  + dysrhythmias (PVCs) + Valvular Problems/Murmurs (mod MR) MR  Rhythm:Regular Rate:Normal  Echo 04/2024  1. Rhythm strip during this exam demonstrates frequent premature  ventricular contractions.   2. Left ventricular ejection fraction, by estimation, is 60 to 65%. The  left ventricle has normal function.   3. Right ventricular systolic function is normal. The right ventricular  size is mildly enlarged.   4. Left atrial size was mildly dilated. No left atrial/left atrial  appendage thrombus was detected. The LAA emptying velocity was 52 cm/s.   5. Right atrial size was mildly dilated.   6. The mitral valve is mildly degenerative. Moderate mitral valve  regurgitation. No evidence of mitral stenosis. No apparent prolapse or  flail.   7. The aortic valve is tricuspid. Aortic valve regurgitation is trivial.  No aortic stenosis is present.   8. There is mild (Grade II) plaque involving the aortic arch and  descending aorta.   9. Agitated saline contrast bubble study was negative, with no evidence  of any interatrial shunt.  10. 3D performed of the mitral valve and demonstrates grossly normal,  mildly degenerative mitral valve with moderate MR between A1/P1 and A3/P3.      Neuro/Psych  PSYCHIATRIC DISORDERS Anxiety     negative  neurological ROS     GI/Hepatic ,GERD  Controlled and Medicated,,(+)     substance abuse  alcohol use  Endo/Other  negative endocrine ROS    Renal/GU negative Renal ROS  negative genitourinary   Musculoskeletal negative musculoskeletal ROS (+)    Abdominal   Peds  Hematology negative hematology ROS (+)   Anesthesia Other Findings Zepbound LD:   Reproductive/Obstetrics negative OB ROS                              Anesthesia Physical Anesthesia Plan  ASA: 3  Anesthesia Plan: MAC   Post-op Pain Management: Minimal or no pain anticipated   Induction:   PONV Risk Score and Plan: 1 and Treatment may vary due to age or medical condition  Airway Management Planned: Natural Airway and Simple Face Mask  Additional Equipment: None  Intra-op Plan:   Post-operative Plan:   Informed Consent: I have reviewed the patients History and Physical, chart, labs and discussed the procedure including the risks, benefits and alternatives for the proposed anesthesia with the patient or authorized representative who has indicated his/her understanding and acceptance.     Dental advisory given  Plan Discussed with: CRNA and Surgeon  Anesthesia Plan Comments:          Anesthesia Quick Evaluation

## 2024-08-03 NOTE — Pre-Procedure Instructions (Signed)
Instructed patient on the following items: Arrival time 0830 Nothing to eat or drink after midnight No meds AM of procedure Responsible person to drive you home and stay with you for 24 hrs    

## 2024-08-04 ENCOUNTER — Ambulatory Visit (HOSPITAL_COMMUNITY): Payer: Self-pay | Admitting: Anesthesiology

## 2024-08-04 ENCOUNTER — Other Ambulatory Visit: Payer: Self-pay

## 2024-08-04 ENCOUNTER — Ambulatory Visit (HOSPITAL_COMMUNITY)
Admission: RE | Disposition: A | Discharge status: Home / Self Care | Payer: Self-pay | Source: Home / Self Care | Attending: Student in an Organized Health Care Education/Training Program

## 2024-08-04 ENCOUNTER — Encounter (HOSPITAL_COMMUNITY): Payer: Self-pay | Admitting: Anesthesiology

## 2024-08-04 ENCOUNTER — Ambulatory Visit (HOSPITAL_COMMUNITY)
Admission: RE | Admit: 2024-08-04 | Discharge: 2024-08-04 | Disposition: A | Attending: Student in an Organized Health Care Education/Training Program | Admitting: Student in an Organized Health Care Education/Training Program

## 2024-08-04 DIAGNOSIS — I2510 Atherosclerotic heart disease of native coronary artery without angina pectoris: Secondary | ICD-10-CM | POA: Diagnosis not present

## 2024-08-04 DIAGNOSIS — Z96653 Presence of artificial knee joint, bilateral: Secondary | ICD-10-CM | POA: Insufficient documentation

## 2024-08-04 DIAGNOSIS — I10 Essential (primary) hypertension: Secondary | ICD-10-CM

## 2024-08-04 DIAGNOSIS — I340 Nonrheumatic mitral (valve) insufficiency: Secondary | ICD-10-CM | POA: Insufficient documentation

## 2024-08-04 DIAGNOSIS — K219 Gastro-esophageal reflux disease without esophagitis: Secondary | ICD-10-CM | POA: Insufficient documentation

## 2024-08-04 DIAGNOSIS — I493 Ventricular premature depolarization: Secondary | ICD-10-CM | POA: Insufficient documentation

## 2024-08-04 DIAGNOSIS — F329 Major depressive disorder, single episode, unspecified: Secondary | ICD-10-CM | POA: Insufficient documentation

## 2024-08-04 DIAGNOSIS — R5383 Other fatigue: Secondary | ICD-10-CM | POA: Insufficient documentation

## 2024-08-04 DIAGNOSIS — E785 Hyperlipidemia, unspecified: Secondary | ICD-10-CM | POA: Insufficient documentation

## 2024-08-04 DIAGNOSIS — F419 Anxiety disorder, unspecified: Secondary | ICD-10-CM

## 2024-08-04 DIAGNOSIS — Z79899 Other long term (current) drug therapy: Secondary | ICD-10-CM | POA: Insufficient documentation

## 2024-08-04 DIAGNOSIS — G4730 Sleep apnea, unspecified: Secondary | ICD-10-CM | POA: Insufficient documentation

## 2024-08-04 DIAGNOSIS — R002 Palpitations: Secondary | ICD-10-CM | POA: Insufficient documentation

## 2024-08-04 HISTORY — PX: PVC ABLATION: EP1236

## 2024-08-04 MED ORDER — SODIUM CHLORIDE 0.9 % IV SOLN: INTRAVENOUS | Status: DC

## 2024-08-04 MED ORDER — HEPARIN SODIUM (PORCINE) 1000 UNIT/ML IJ SOLN
INTRAMUSCULAR | Status: DC | PRN
  Administered 2024-08-04: 1000 [IU] via INTRAVENOUS

## 2024-08-04 MED ORDER — BUPIVACAINE HCL (PF) 0.25 % IJ SOLN
INTRAMUSCULAR | Status: AC
  Filled 2024-08-04: qty 30

## 2024-08-04 MED ORDER — HEPARIN (PORCINE) IN NACL 1000-0.9 UT/500ML-% IV SOLN
INTRAVENOUS | Status: DC | PRN
  Administered 2024-08-04 (×2): 500 mL

## 2024-08-04 MED ORDER — HEPARIN SODIUM (PORCINE) 1000 UNIT/ML IJ SOLN
INTRAMUSCULAR | Status: AC
  Filled 2024-08-04: qty 10

## 2024-08-04 MED ORDER — SODIUM CHLORIDE 0.9% FLUSH: 3.0000 mL | Freq: Two times a day (BID) | INTRAVENOUS | Status: DC

## 2024-08-04 MED ORDER — FENTANYL CITRATE (PF) 100 MCG/2ML IJ SOLN
INTRAMUSCULAR | Status: DC | PRN
  Administered 2024-08-04: 50 ug via INTRAVENOUS
  Administered 2024-08-04 (×2): 25 ug via INTRAVENOUS

## 2024-08-04 MED ORDER — MIDAZOLAM HCL 2 MG/2ML IJ SOLN
INTRAMUSCULAR | Status: AC
  Filled 2024-08-04: qty 2

## 2024-08-04 MED ORDER — SODIUM CHLORIDE 0.9% FLUSH: 3.0000 mL | INTRAVENOUS | Status: DC | PRN

## 2024-08-04 MED ORDER — MIDAZOLAM HCL (PF) 2 MG/2ML IJ SOLN
INTRAMUSCULAR | Status: DC | PRN
  Administered 2024-08-04 (×2): 1 mg via INTRAVENOUS

## 2024-08-04 MED ORDER — FENTANYL CITRATE (PF) 100 MCG/2ML IJ SOLN
INTRAMUSCULAR | Status: AC
  Filled 2024-08-04: qty 2

## 2024-08-04 MED ORDER — LACTATED RINGERS IV SOLN: INTRAVENOUS | Status: DC | PRN

## 2024-08-04 MED ORDER — SODIUM CHLORIDE 0.9 % IV SOLN: 250.0000 mL | INTRAVENOUS | Status: DC | PRN

## 2024-08-04 MED ORDER — PROPOFOL 10 MG/ML IV BOLUS
INTRAVENOUS | Status: DC | PRN
  Administered 2024-08-04: 50 mg via INTRAVENOUS

## 2024-08-04 NOTE — Interval H&P Note (Signed)
 History and Physical Interval Note:  08/04/2024 10:34 AM  [name]  has presented today for surgery, with the diagnosis of PVCs.  The various methods of treatment have been discussed with the patient and family. After consideration of risks, benefits and other options for treatment, the patient has consented to  Procedure(s): PVC ABLATION (N/A) as a surgical intervention.  The patient's history has been reviewed, patient examined, no change in status, stable for surgery.  I have reviewed the patient's chart and labs.  Questions were answered to the patient's satisfaction.     Eric Rhodes

## 2024-08-04 NOTE — Op Note (Signed)
   Procedure - Electrophysiology Study and PVC Ablation  Attending: Adina Primus, MD  PVC/Substrate type: RVOT PVC   Presenting Rhythm: Ventricular trigeminy, PR 164, QRS 94, QT 390, RR 893, Unifocal PVC with LBBB morphology, V3 transition, inferior axis  Complications: none  Anesthesia: MAC  Estimated Blood Loss: less than 50 mL  Procedures done: Intracardiac Catheter Ablation of Ventricular Tachycardia (CPT [phone]) Intracardiac echocardiography (CPT [phone])  Access: - RFV: 8 Fr short sheath->Medium curl Vizigo sheath  - RFV: 9 Fr short sheath-> 8 Fr Soundstar ICE catheter  Comments: - Normal baseline conduction intervals - Frequent PVCs on arrival with left bundle branch block morphology, V3  transition - Venous access pre-closed with Perclose ProStyle sutures  - Earliest activation localized to septal RVOT in the right ventricular outflow tract - Local electrogram 32 ms pre-QRS for PVC at earliest site with long fractionated signal and QS morphology on unipolar mapping  - Ablation at site of earliest activation resulted in bursts of clinical PVC morphology followed by cessation of all PCVs - No clinical PVCs following ablation with 20 minute wait period  - ICE without any effusion post ablation  - Sheaths removed with hemostasis with Perclose sutures   Plan: - Routine post-procedure care with bedrest for 2 hours - Discharge home today  - Follow-up in clinic as scheduled   Donnice DELENA Primus, MD [facility]  Cardiac Electrophysiology

## 2024-08-04 NOTE — Transfer of Care (Signed)
 Immediate Anesthesia Transfer of Care Note  Patient: Eric Rhodes  Procedure(s) Performed: PVC ABLATION  Patient Location: PACU  Anesthesia Type:MAC  Level of Consciousness: awake, alert , and oriented  Airway & Oxygen Therapy: Patient Spontanous Breathing and Patient connected to nasal cannula oxygen  Post-op Assessment: Report given to RN and Post -op Vital signs reviewed and stable  Post vital signs: Reviewed and stable  Last Vitals:  Vitals Value Taken Time  BP 135/72 08/04/24 13:30  Temp 36.8 C 08/04/24 12:50  Pulse 65 08/04/24 13:37  Resp 22 08/04/24 13:37  SpO2 98 % 08/04/24 13:37  Vitals shown include unfiled device data.  Last Pain:  Vitals:   08/04/24 1220  TempSrc: Oral  PainSc:          Complications: There were no known notable events for this encounter.

## 2024-08-04 NOTE — H&P (Signed)
 Cardiology Office Note    Date:  08/04/24 ID:  Eric Rhodes, DOB 07/27/1956, MRN [id number] PCP: Cleotilde Planas, MD  [facility] Providers Cardiologist:  Soyla DELENA Merck, MD Electrophysiologist:  Donnice DELENA Primus, MD    History of Present Illness Eric Rhodes is a 68 y.o. 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  He had hyperlipidemia and was trying to get on a better exercise regimen to lose weight and was drinking near a pot of coffee a day.  Even with illumination of caffeine his palpitations persisted.  Lab work with normal electrolytes, thyroid  function, and no anemia.  He started taking metoprolol  twice daily however he has baseline bradycardia at rest.  He had a TTE in 2018 which showed trace MR.  Follow-up TEE with moderate MR this year.   He regularly has prolonged bike rides and sometimes biking up to 40 miles.  He noticed that on his Garmin his heart rate was not able to augment like it had in the past.  He would often be Down at 140s instead of 170s max exertion.   His wife was with him in the office today.  She has been an tree surgeon for over 20 years.  With his retirement they are planning to go [place] for her trip for her to teach and for him to complete a bike race.  He wants his PVCs dealt with to decrease symptoms and allow better endurance during his training.   ROS: palpitations, SOB   Studies Reviewed   ECG  Result date: 05/21/24 NSR 65, PR 150, QRS 92, QT/c 402/418 Frequent unifocal PVCs, LBBB, left inferior axis with V4 transition, c/w RVOT origin    CT FFR Result date:  05/03/24 1. Left Main:  minimum lesion-specific FFRct of 0.98.   2. LAD: Minimum lesion-specific FFRct of 0.91 (D1) and 0.91 (mid LAD). 3. LCX: Minimum lesion-specific FFRct of 0.97 (OM2). 4. RCA: Minimum lesion-specific FFRct of 0.96 (mid RCA). The proximal RCA FFRct value is 0.98.   IMPRESSION: 1. CT FFR analysis shows no evidence of significant functional stenosis.     TEE Result date: 04/26/24  1. Rhythm strip during this exam demonstrates frequent premature  ventricular contractions.   2. Left ventricular ejection fraction, by estimation, is 60 to 65%. The  left ventricle has normal function.   3. Right ventricular systolic function is normal. The right ventricular  size is mildly enlarged.   4. Left atrial size was mildly dilated. No left atrial/left atrial  appendage thrombus was detected. The LAA emptying velocity was 52 cm/s.   5. Right atrial size was mildly dilated.   6. The mitral valve is mildly degenerative. Moderate mitral valve  regurgitation. No evidence of mitral stenosis. No apparent prolapse or  flail.   7. The aortic valve is tricuspid. Aortic valve regurgitation is trivial.  No aortic stenosis is present.   8. There is mild (Grade II) plaque involving the aortic arch and  descending aorta.   9. Agitated saline contrast bubble study was negative, with no evidence  of any interatrial shunt.  10. 3D performed of the mitral valve and demonstrates  grossly normal,  mildly degenerative mitral valve with moderate MR between A1/P1 and A3/P3.    4 day monitor  Result date: 04/24/24   >20% burdent of PVCs. Symptoms of chest pressure and lightheadedness associated with PVCs.   10 beat run of NSVT rate 158 bpm.     Physical Exam VS:  BP (!) 108/50 (BP Location: Right Arm, Patient Position: Sitting, Cuff Size: Normal)   Pulse 66   Ht 5' 3.5 (1.613 m)   Wt 154 lb 3.2 oz (69.9 kg)   SpO2 98%   BMI 26.89 kg/m          Wt Readings from Last 3 Encounters:   05/21/24 154 lb 3.2 oz (69.9 kg)  04/26/24 156 lb (70.8 kg)  04/09/24 158 lb (71.7 kg)    GEN: Well nourished, well developed in no acute distress NECK: No JVD  CARDIAC: regular rate, irregular rhythm, no murmurs, rubs, gallops RESPIRATORY:  Clear to auscultation without rales, wheezing or rhonchi  EXTREMITIES:  No edema; No deformity  Skin: b/l healed knee incisions    ASSESSMENT AND PLAN Eric Rhodes is a 68 y.o. 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  We discussed different options for management including rhythm medications versus ablation.  He strongly preference ablation over medications if possible.  We reviewed the main 3 reasons that PVC ablation can sometimes be troublesome from a procedural standpoint.  These include lack of PVC burden, thickness of myocardial tissue, and inability to access the earliest origin of the PVC as opposed to the exit site.  I think there is a high chance of success for his PVCs which are frequent, unifocal, and likely originate from the RVOT.  He is planning on having a minimally invasive hemilaminectomy on October 8 for chronic radicular back pain.  Recovery from this is fairly quick as it is done with an endoscope.  He thought recovery would be less than 2 weeks.  I did ask that he discuss further with his surgeon in case we needed to map the LVOT and heparinize during the procedure.  He is wanting to get this done before he and his wife leave for [place]  for the winter.  Will plan for the first week of November and less needs to be delayed further from his surgeon standpoint.   Discussed treatment options today for his PVC including  antiarrhythmic drug therapy and ablation. Discussed risks, recovery and likelihood of success with each treatment strategy. Risk, benefits, and alternatives to EP study and ablation for PVCs were discussed. These risks include but are not limited to stroke, bleeding, vascular damage, tamponade, perforation, damage to lungs, phrenic nerve and other structures, worsening renal function, coronary vasospasm and death.  Discussed potential need for repeat ablation procedures and antiarrhythmic drugs after an initial ablation. The patient understands these risk and wishes to proceed.  We will therefore proceed with catheter ablation the first week of October. Carto, ICE and anesthesia are requested for the procedure.     Pre procedural overview Carto, ICE and anesthesia are requested for the procedure. No CT needed. Hold metoprolol  5 days prior. All other meds give the day prior but hold the morning of procedure.  Dispo: f/u post ablation    A total of 50 minutes was spent preparing for the patient, reviewing history, performing exam, documenting encounter, and counseling the patient and his wife.  40 minutes of direct patient care.   Signed, Donnice DELENA Primus, MD

## 2024-08-04 NOTE — Progress Notes (Signed)
 Pt and wife received discharge instructions, teach back performed. Pt ambulated to bathroom and back to room. Rt fem site is clean dry intact, no signs of bleeding, site is soft. IV's removed, no complications, pt escorted out via wheelchair to wife's vehicle.

## 2024-08-04 NOTE — Anesthesia Postprocedure Evaluation (Signed)
 Anesthesia Post Note  Patient: Eric Rhodes  Procedure(s) Performed: PVC ABLATION     Patient location during evaluation: Phase II Anesthesia Type: MAC Level of consciousness: awake and alert, patient cooperative and oriented Pain management: pain level controlled Vital Signs Assessment: post-procedure vital signs reviewed and stable Respiratory status: spontaneous breathing, nonlabored ventilation and respiratory function stable Cardiovascular status: blood pressure returned to baseline and stable Postop Assessment: no headache Anesthetic complications: no   There were no known notable events for this encounter.  Last Vitals:  Vitals:   08/04/24 1255 08/04/24 1300  BP:  121/71  Pulse: (!) 53 (!) 56  Resp: (!) 5   Temp:    SpO2: 97% 94%    Last Pain:  Vitals:   08/04/24 1220  TempSrc: Oral  PainSc:                  Loreta Blouch,E. Travas Schexnayder

## 2024-08-04 NOTE — Discharge Instructions (Signed)
 Femoral Site Care This sheet gives you information about how to care for yourself after your procedure. Your health care provider may also give you more specific instructions. If you have problems or questions, contact your health care provider. What can I expect after the procedure?  After the procedure, it is common to have: Bruising that usually fades within 1-2 weeks. Tenderness at the site. Follow these instructions at home: Wound care Follow instructions from your health care provider about how to take care of your insertion site. Make sure you: Wash your hands with soap and water  before you change your bandage (dressing). If soap and water  are not available, use hand sanitizer. Remove your dressing as told by your health care provider. 24 hours Do not take baths, swim, or use a hot tub until your health care provider approves. You may shower 24-48 hours after the procedure or as told by your health care provider. Gently wash the site with plain soap and water . Pat the area dry with a clean towel. Do not rub the site. This may cause bleeding. Do not apply powder or lotion to the site. Keep the site clean and dry. Check your femoral site every day for signs of infection. Check for: Redness, swelling, or pain. Fluid or blood. Warmth. Pus or a bad smell. Activity For the first 2-3 days after your procedure, or as long as directed: Avoid climbing stairs as much as possible. Do not squat. Do not lift anything that is heavier than 10 lb (4.5 kg), or the limit that you are told, until your health care provider says that it is safe. For 5 days Rest as directed. Avoid sitting for a long time without moving. Get up to take short walks every 1-2 hours. Do not drive for 24 hours if you were given a medicine to help you relax (sedative). General instructions Take over-the-counter and prescription medicines only as told by your health care provider. Keep all follow-up visits as told by your  health care provider. This is important. Contact a health care provider if you have: A fever or chills. You have redness, swelling, or pain around your insertion site. Get help right away if: The catheter insertion area swells very fast. You pass out. You suddenly start to sweat or your skin gets clammy. The catheter insertion area is bleeding, and the bleeding does not stop when you hold steady pressure on the area. The area near or just beyond the catheter insertion site becomes pale, cool, tingly, or numb. These symptoms may represent a serious problem that is an emergency. Do not wait to see if the symptoms will go away. Get medical help right away. Call your local emergency services (911 in the [place] Do not drive yourself to the hospital. Summary After the procedure, it is common to have bruising that usually fades within 1-2 weeks. Check your femoral site every day for signs of infection. Do not lift anything that is heavier than 10 lb (4.5 kg), or the limit that you are told, until your health care provider says that it is safe. This information is not intended to replace advice given to you by your health care provider. Make sure you discuss any questions you have with your health care provider. Document Revised: 09/01/2017 Document Reviewed: 09/01/2017 Elsevier Patient Education  2020 [employer].

## 2024-08-05 ENCOUNTER — Encounter (HOSPITAL_COMMUNITY): Payer: Self-pay | Admitting: Student in an Organized Health Care Education/Training Program

## 2024-08-05 ENCOUNTER — Telehealth (HOSPITAL_COMMUNITY): Payer: Self-pay

## 2024-08-05 MED FILL — Bupivacaine HCl Preservative Free (PF) Inj 0.25%: INTRAMUSCULAR | Qty: 30 | Status: AC

## 2024-08-05 NOTE — Telephone Encounter (Signed)
 Spoke with patient to complete post procedure follow up call.  Patient reports no complications with groin sites.   Instructions reviewed with patient:  Remove large bandage at puncture site after 24 hours. It is normal to have bruising, tenderness, mild swelling, and a pea or marble sized lump/knot at the groin site which can take up to three months to resolve.  Get help right away if you notice sudden swelling at the puncture site.  Check your puncture site every day for signs of infection: fever, redness, swelling, pus drainage, warmth, foul odor or excessive pain. If this occurs, please call [phone], to speak with the RN Navigator. Get help right away if your puncture site is bleeding and the bleeding does not stop after applying firm pressure to the area.  You may continue to have skipped beats during the first several months after your procedure.    You will follow up with the APP on 08/12/24.   Activity restrictions reviewed.  Patient verbalized understanding to all instructions provided.

## 2024-08-10 NOTE — Progress Notes (Unsigned)
 Cardiology Office Note:  .   Date:  08/10/2024  ID:  Reyes LABOR Duffner, DOB 1955-11-16, MRN [id number] PCP: Cleotilde Planas, MD  [facility] Providers Cardiologist:  Soyla LABOR Merck, MD Electrophysiologist:  Donnice LABOR Primus, MD {  History of Present Illness: .   Lacharles Altschuler Swayne is a 68 y.o. 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 Merck last 04/09/24, c/o palpitations, active he was concerned of some MR on his echo with reduced exertional capacity of late, planned for TEE to better evaluate his MR  04/26/24: Normal LV function, 65% Moderate MR at normal blood pressure, mild to moderate with hypotension. Mildly degenerative mitral valve with no prolapse or flail. Trivial TR, normal Aortic valve. No PFO. Frequent PVCs throughout study.   Monitor with >20% PVC burden  Referred to Dr. Primus for management strategies for his PVCs, discussed options, he very much preferred to avoid more medications, planned for an ablation (after his pending minimally invasive hemilaminectomy already scheduled)  PVC ablation on 08/04/24  Today's visit is scheduled as *** ROS:   *** retired podiatrist *** procedure site (s) *** PVCs ? Symptoms/EKG  Arrhythmia/AAD hx PVCs noted 2023 PVC ablation 08/04/24 (RVOT  Studies Reviewed: SABRA    EKG done today and reviewed by myself:  ***  08/04/24: EPS/ablation Comments: - Normal baseline conduction intervals - Frequent PVCs on arrival with left bundle branch block morphology, V3 transition - Venous access pre-closed with Perclose ProStyle sutures  - Earliest activation localized to septal RVOT in the right ventricular outflow tract - Local electrogram 32 ms pre-QRS for PVC at earliest site with long fractionated signal and QS morphology on unipolar  mapping  - Ablation at site of earliest activation resulted in bursts of clinical PVC morphology followed by cessation of all PCVs - No clinical PVCs following ablation with 20 minute wait period  - ICE without any effusion post ablation  - Sheaths removed with hemostasis with Perclose sutures   Plan: - Routine post-procedure care with bedrest for 2 hours - Discharge home today  - Follow-up in clinic as scheduled    Aug 2025: monitor   >20% burdent of PVCs. Symptoms of chest pressure and lightheadedness associated with PVCs.   10 beat run of NSVT rate 158 bpm.    CT FFR Result date: 05/03/24 1. Left Main:  minimum lesion-specific FFRct of 0.98.   2. LAD: Minimum lesion-specific FFRct of 0.91 (D1) and 0.91 (mid LAD). 3. LCX: Minimum lesion-specific FFRct of 0.97 (OM2). 4. RCA: Minimum lesion-specific FFRct of 0.96 (mid RCA). The proximal RCA FFRct value is 0.98.   IMPRESSION: 1. CT FFR analysis shows no evidence of significant functional stenosis.     TEE Result date: 04/26/24  1. Rhythm strip during this exam demonstrates frequent premature  ventricular contractions.   2. Left ventricular ejection fraction, by estimation, is 60 to 65%. The  left ventricle has normal function.   3. Right ventricular systolic function is normal. The right ventricular  size is mildly enlarged.   4. Left atrial size was mildly dilated. No left atrial/left atrial  appendage thrombus was detected. The LAA emptying velocity was 52 cm/s.   5. Right atrial size was mildly dilated.   6. The mitral valve is mildly degenerative. Moderate mitral  valve  regurgitation. No evidence of mitral stenosis. No apparent prolapse or  flail.   7. The aortic valve is tricuspid. Aortic valve regurgitation is trivial.  No aortic stenosis is present.   8. There is mild (Grade II) plaque involving the aortic arch and  descending aorta.   9. Agitated saline contrast bubble study was negative, with no evidence  of  any interatrial shunt.  10. 3D performed of the mitral valve and demonstrates grossly normal,  mildly degenerative mitral valve with moderate MR between A1/P1 and A3/P3.    Risk Assessment/Calculations:    Physical Exam:   VS:  There were no vitals taken for this visit.   Wt Readings from Last 3 Encounters:  08/04/24 150 lb (68 kg)  05/21/24 154 lb 3.2 oz (69.9 kg)  04/26/24 156 lb (70.8 kg)    GEN: Well nourished, well developed in no acute distress NECK: No JVD; No carotid bruits CARDIAC: ***RRR, no murmurs, rubs, gallops RESPIRATORY:  *** CTA b/l without rales, wheezing or rhonchi  ABDOMEN: Soft, non-tender, non-distended EXTREMITIES: *** No edema; No deformity   ASSESSMENT AND PLAN: .    PVCs S/p Ablation (RVOT) ***      {Are you ordering a CV Procedure (e.g. stress test, cath, DCCV, TEE, etc)?   Press F2        :[id number]}     Dispo: ***  Signed, Charlies Macario Arthur, PA-C

## 2024-08-12 ENCOUNTER — Ambulatory Visit: Attending: Physician Assistant | Admitting: Physician Assistant

## 2024-08-12 VITALS — BP 126/62 | HR 66 | Ht 63.0 in | Wt 159.0 lb

## 2024-08-12 DIAGNOSIS — I493 Ventricular premature depolarization: Secondary | ICD-10-CM | POA: Insufficient documentation

## 2024-08-12 NOTE — Patient Instructions (Signed)
 Medication Instructions:   Your physician recommends that you continue on your current medications as directed. Please refer to the Current Medication list given to you today.   *If you need a refill on your cardiac medications before your next appointment, please call your pharmacy*  Lab Work:  NONE ORDERED  TODAY   If you have labs (blood work) drawn today and your tests are completely normal, you will receive your results only by: MyChart Message (if you have MyChart) OR A paper copy in the mail If you have any lab test that is abnormal or we need to change your treatment, we will call you to review the results.   Testing/Procedures:  NONE ORDERED  TODAY    Follow-Up: At [facility], you and your health needs are our priority.  As part of our continuing mission to provide you with exceptional heart care, our providers are all part of one team.  This team includes your primary Cardiologist (physician) and Advanced Practice Providers or APPs (Physician Assistants and Nurse Practitioners) who all work together to provide you with the care you need, when you need it.  Your next appointment:   6 month(s)  Provider:   You may see Eric DELENA Primus, MD or one of the following Advanced Practice Providers on your designated Care Team:   Charlies Arthur, [place]   We recommend signing up for the patient portal called MyChart.  Sign up information is provided on this After Visit Summary.  MyChart is used to connect with patients for Virtual Visits (Telemedicine).  Patients are able to view lab/test results, encounter notes, upcoming appointments, etc.  Non-urgent messages can be sent to your provider as well.   To learn more about what you can do with MyChart, go to [email or web address].   Other Instructions
